# Patient Record
Sex: Female | Born: 1990 | Race: Black or African American | Hispanic: No | Marital: Married | Smoking: Former smoker
Health system: Southern US, Community
[De-identification: ages and names within clinical notes are randomized; demographics above are authoritative.]

## PROBLEM LIST (undated history)

## (undated) ENCOUNTER — Inpatient Hospital Stay (HOSPITAL_COMMUNITY): Payer: Self-pay

## (undated) DIAGNOSIS — F32A Depression, unspecified: Secondary | ICD-10-CM

## (undated) DIAGNOSIS — K219 Gastro-esophageal reflux disease without esophagitis: Secondary | ICD-10-CM

## (undated) DIAGNOSIS — F411 Generalized anxiety disorder: Secondary | ICD-10-CM

## (undated) DIAGNOSIS — D649 Anemia, unspecified: Secondary | ICD-10-CM

## (undated) DIAGNOSIS — Z8619 Personal history of other infectious and parasitic diseases: Secondary | ICD-10-CM

## (undated) DIAGNOSIS — F329 Major depressive disorder, single episode, unspecified: Secondary | ICD-10-CM

## (undated) HISTORY — DX: Anemia, unspecified: D64.9

## (undated) HISTORY — DX: Personal history of other infectious and parasitic diseases: Z86.19

## (undated) HISTORY — PX: DILATION AND CURETTAGE OF UTERUS: SHX78

---

## 2010-07-14 ENCOUNTER — Ambulatory Visit: Payer: Self-pay | Admitting: Obstetrics and Gynecology

## 2010-07-14 ENCOUNTER — Inpatient Hospital Stay (HOSPITAL_COMMUNITY): Admission: AD | Admit: 2010-07-14 | Discharge: 2010-07-14 | Payer: Self-pay | Admitting: Anesthesiology

## 2010-08-05 ENCOUNTER — Emergency Department (HOSPITAL_COMMUNITY): Admission: EM | Admit: 2010-08-05 | Discharge: 2010-08-05 | Payer: Self-pay | Admitting: Emergency Medicine

## 2011-03-04 LAB — URINALYSIS, ROUTINE W REFLEX MICROSCOPIC
Glucose, UA: NEGATIVE mg/dL
Hgb urine dipstick: NEGATIVE
Ketones, ur: NEGATIVE mg/dL
Protein, ur: NEGATIVE mg/dL
Urobilinogen, UA: 0.2 mg/dL (ref 0.0–1.0)

## 2011-05-25 ENCOUNTER — Other Ambulatory Visit (HOSPITAL_COMMUNITY): Payer: Self-pay | Admitting: Obstetrics and Gynecology

## 2011-05-25 DIAGNOSIS — O3680X0 Pregnancy with inconclusive fetal viability, not applicable or unspecified: Secondary | ICD-10-CM

## 2011-05-26 ENCOUNTER — Ambulatory Visit (HOSPITAL_COMMUNITY): Payer: BC Managed Care – PPO

## 2011-05-26 ENCOUNTER — Other Ambulatory Visit (HOSPITAL_COMMUNITY): Payer: Self-pay

## 2011-05-30 ENCOUNTER — Other Ambulatory Visit (HOSPITAL_COMMUNITY): Payer: Self-pay

## 2011-05-30 ENCOUNTER — Ambulatory Visit (HOSPITAL_COMMUNITY)
Admission: RE | Admit: 2011-05-30 | Discharge: 2011-05-30 | Disposition: A | Discharge status: Home / Self Care | Payer: BC Managed Care – PPO | Source: Ambulatory Visit | Attending: Obstetrics and Gynecology | Admitting: Obstetrics and Gynecology

## 2011-05-30 DIAGNOSIS — Z3689 Encounter for other specified antenatal screening: Secondary | ICD-10-CM | POA: Insufficient documentation

## 2011-05-30 DIAGNOSIS — O3680X0 Pregnancy with inconclusive fetal viability, not applicable or unspecified: Secondary | ICD-10-CM

## 2011-06-03 ENCOUNTER — Inpatient Hospital Stay (HOSPITAL_COMMUNITY)
Admission: AD | Admit: 2011-06-03 | Discharge: 2011-06-03 | Disposition: A | Discharge status: Home / Self Care | Payer: BC Managed Care – PPO | Source: Ambulatory Visit | Attending: Obstetrics and Gynecology | Admitting: Obstetrics and Gynecology

## 2011-06-03 DIAGNOSIS — N760 Acute vaginitis: Secondary | ICD-10-CM | POA: Insufficient documentation

## 2011-06-03 DIAGNOSIS — A499 Bacterial infection, unspecified: Secondary | ICD-10-CM | POA: Insufficient documentation

## 2011-06-03 DIAGNOSIS — B9689 Other specified bacterial agents as the cause of diseases classified elsewhere: Secondary | ICD-10-CM | POA: Insufficient documentation

## 2011-06-03 DIAGNOSIS — B3731 Acute candidiasis of vulva and vagina: Secondary | ICD-10-CM | POA: Insufficient documentation

## 2011-06-03 DIAGNOSIS — O2390 Unspecified genitourinary tract infection in pregnancy, unspecified trimester: Secondary | ICD-10-CM

## 2011-06-03 DIAGNOSIS — B373 Candidiasis of vulva and vagina: Secondary | ICD-10-CM | POA: Insufficient documentation

## 2011-06-03 DIAGNOSIS — N949 Unspecified condition associated with female genital organs and menstrual cycle: Secondary | ICD-10-CM | POA: Insufficient documentation

## 2011-06-03 LAB — WET PREP, GENITAL: WBC, Wet Prep HPF POC: NONE SEEN

## 2011-06-06 LAB — GC/CHLAMYDIA PROBE AMP, GENITAL: Chlamydia, DNA Probe: POSITIVE — AB

## 2011-06-19 LAB — ABO/RH: RH Type: POSITIVE

## 2011-06-19 LAB — RUBELLA ANTIBODY, IGM: Rubella: NON-IMMUNE/NOT IMMUNE

## 2011-06-19 LAB — HEPATITIS B SURFACE ANTIGEN: Hepatitis B Surface Ag: NEGATIVE

## 2011-06-19 LAB — RPR: RPR: NONREACTIVE

## 2011-07-07 ENCOUNTER — Encounter (HOSPITAL_COMMUNITY): Payer: Self-pay | Admitting: *Deleted

## 2011-07-07 ENCOUNTER — Inpatient Hospital Stay (HOSPITAL_COMMUNITY)
Admission: AD | Admit: 2011-07-07 | Discharge: 2011-07-08 | Disposition: A | Discharge status: Home / Self Care | Payer: BC Managed Care – PPO | Source: Ambulatory Visit | Attending: Obstetrics and Gynecology | Admitting: Obstetrics and Gynecology

## 2011-07-07 DIAGNOSIS — W108XXA Fall (on) (from) other stairs and steps, initial encounter: Secondary | ICD-10-CM | POA: Insufficient documentation

## 2011-07-07 DIAGNOSIS — W19XXXA Unspecified fall, initial encounter: Secondary | ICD-10-CM

## 2011-07-07 DIAGNOSIS — O99891 Other specified diseases and conditions complicating pregnancy: Secondary | ICD-10-CM | POA: Insufficient documentation

## 2011-07-07 DIAGNOSIS — M549 Dorsalgia, unspecified: Secondary | ICD-10-CM | POA: Insufficient documentation

## 2011-07-07 DIAGNOSIS — R109 Unspecified abdominal pain: Secondary | ICD-10-CM | POA: Insufficient documentation

## 2011-07-07 DIAGNOSIS — Z331 Pregnant state, incidental: Secondary | ICD-10-CM

## 2011-07-07 NOTE — Progress Notes (Signed)
Pt states, " At about 9 pm I fell down 4 wooden step onto my back and the right side of my abd.. Since I fell i've had pain in my low back and the right side of my abdomen."

## 2011-07-07 NOTE — ED Provider Notes (Signed)
History     Chief Complaint  Patient presents with  . Fall  . Back Pain   HPI 14w 6d gestation.  G1 P0  Tonight slipped down steps at home and hit on right lower back and right side and abdomen at waist level.    OB History    Grav Para Term Preterm Abortions TAB SAB Ect Mult Living   1         0      No past medical history on file.  No past surgical history on file.  No family history on file.  History  Substance Use Topics  . Smoking status: Former Smoker -- 0.5 packs/day for 2 years    Types: Cigarettes    Quit date: 04/25/2011  . Smokeless tobacco: Not on file  . Alcohol Use: No    Allergies: No Known Allergies  Prescriptions prior to admission  Medication Sig Dispense Refill  . azithromycin (ZITHROMAX) 500 MG tablet Take 500 mg by mouth daily.        . metroNIDAZOLE (FLAGYL) 500 MG tablet Take 500 mg by mouth 2 (two) times daily. Take for 7 days course       . prenatal vitamin w/FE, FA (PRENATAL 1 + 1) 27-1 MG TABS Take 1 tablet by mouth daily.        . Psyllium (METAMUCIL PO) Take 1 each by mouth daily as needed. For constipation         Review of Systems  Genitourinary:       No vaginal bleeding. No leaking of fluid.  Abdominal pain where she hit at waist level.  No uterine cramping.   Physical Exam   Blood pressure 112/79, pulse 90, temperature 98.6 F (37 C), temperature source Oral, resp. rate 18, height 5' 4.5" (1.638 m), weight 152 lb 4 oz (69.06 kg).  Physical Exam  Nursing note and vitals reviewed. Constitutional: She is oriented to person, place, and time. She appears well-developed and well-nourished.  HENT:  Head: Normocephalic.  Eyes: EOM are normal.  Neck: Neck supple.  GI: Soft.       Fundus below umbilicus.  Pain above umbilicus on right side of abdomen extending to client's right side and right lower back.  No bruising or discoloration or breaks in skin noted.   FHT 158 with doppler.  Musculoskeletal: Normal range of motion.    Neurological: She is alert and oriented to person, place, and time.  Skin: Skin is warm and dry.       Some areas of hyperpigmentation and rough skin noted consistent with atopic dermatitis  Psychiatric: She has a normal mood and affect.    MAU Course  Procedures  MDM Consult with Dr. Ellyn Hack re: plan of care.  Assessment and Plan  Abdominal pain and back pain following fall  Plan: Take Tylenol 325 mg 2 tablets by mouth every 4 hours if needed for pain. Expect to have some pain. Return with worsening lower abdominal pain or vaginal bleeding. Call your doctor if you have questions or concerns.  BURLESON,TERRI 07/07/2011, 11:56 PM

## 2011-11-15 ENCOUNTER — Inpatient Hospital Stay (HOSPITAL_COMMUNITY)
Admission: AD | Admit: 2011-11-15 | Discharge: 2011-11-15 | Disposition: A | Discharge status: Home / Self Care | Payer: BC Managed Care – PPO | Source: Ambulatory Visit | Attending: Obstetrics and Gynecology | Admitting: Obstetrics and Gynecology

## 2011-11-15 ENCOUNTER — Other Ambulatory Visit: Payer: Self-pay | Admitting: Obstetrics and Gynecology

## 2011-11-15 DIAGNOSIS — A64 Unspecified sexually transmitted disease: Secondary | ICD-10-CM | POA: Insufficient documentation

## 2011-11-15 DIAGNOSIS — O98319 Other infections with a predominantly sexual mode of transmission complicating pregnancy, unspecified trimester: Secondary | ICD-10-CM | POA: Insufficient documentation

## 2011-11-15 MED ORDER — CEFTRIAXONE SODIUM 250 MG IJ SOLR
250.0000 mg | Freq: Once | INTRAMUSCULAR | Status: AC
  Administered 2011-11-15: 250 mg via INTRAMUSCULAR
  Filled 2011-11-15: qty 250

## 2011-11-15 MED ORDER — CEFTRIAXONE SODIUM 250 MG IJ SOLR: 250.0000 mg | Freq: Once | INTRAMUSCULAR | Status: DC

## 2011-11-22 ENCOUNTER — Encounter (HOSPITAL_COMMUNITY): Payer: Self-pay | Admitting: Advanced Practice Midwife

## 2011-11-22 ENCOUNTER — Inpatient Hospital Stay (HOSPITAL_COMMUNITY)
Admission: AD | Admit: 2011-11-22 | Discharge: 2011-11-22 | Disposition: A | Discharge status: Home / Self Care | Payer: BC Managed Care – PPO | Source: Ambulatory Visit | Attending: Obstetrics and Gynecology | Admitting: Obstetrics and Gynecology

## 2011-11-22 DIAGNOSIS — O479 False labor, unspecified: Secondary | ICD-10-CM

## 2011-11-22 DIAGNOSIS — O4700 False labor before 37 completed weeks of gestation, unspecified trimester: Secondary | ICD-10-CM | POA: Insufficient documentation

## 2011-11-22 LAB — URINALYSIS, ROUTINE W REFLEX MICROSCOPIC
Ketones, ur: NEGATIVE mg/dL
Nitrite: NEGATIVE
Protein, ur: NEGATIVE mg/dL
Urobilinogen, UA: 0.2 mg/dL (ref 0.0–1.0)

## 2011-11-22 LAB — URINE MICROSCOPIC-ADD ON

## 2011-11-22 MED ORDER — HYDROXYZINE HCL 25 MG PO TABS
25.0000 mg | ORAL_TABLET | Freq: Once | ORAL | Status: AC
  Administered 2011-11-22: 25 mg via ORAL
  Filled 2011-11-22: qty 1

## 2011-11-22 MED ORDER — HYDROXYZINE HCL 10 MG PO TABS
10.0000 mg | ORAL_TABLET | Freq: Three times a day (TID) | ORAL | Status: AC | PRN
Start: 2011-11-22 — End: 2011-12-02

## 2011-11-22 NOTE — Progress Notes (Signed)
N. Frazier, CNM at bedside.  Assessment done and poc discussed with pt.  

## 2011-11-22 NOTE — ED Provider Notes (Signed)
History     Chief Complaint  Patient presents with  . Abdominal Pain    contractions   HPI 20 y.o. G1P0 at [redacted]w[redacted]d c/o contractions x about 1.5, coming "every few minutes", no bleeding or leaking fluid. + fetal movement. Uncomplicated prenatal course.   No past medical history on file.  No past surgical history on file.  No family history on file.  History  Substance Use Topics  . Smoking status: Former Smoker -- 0.5 packs/day for 2 years    Types: Cigarettes    Quit date: 04/25/2011  . Smokeless tobacco: Not on file  . Alcohol Use: No    Allergies: No Known Allergies  Prescriptions prior to admission  Medication Sig Dispense Refill  . cefTRIAXone (ROCEPHIN) 250 MG injection Inject 250 mg into the muscle once.  FOR IM use in LARGE MUSCLE MASS  1 each  0  . DiphenhydrAMINE HCl (BENADRYL PO) Take 1 capsule by mouth daily as needed. Patient took this medication for allergies.      . metroNIDAZOLE (FLAGYL) 500 MG tablet Take 500 mg by mouth 2 (two) times daily. Take for 7 days course       . prenatal vitamin w/FE, FA (PRENATAL 1 + 1) 27-1 MG TABS Take 1 tablet by mouth daily.          Review of Systems  Constitutional: Negative.   Respiratory: Negative.   Cardiovascular: Negative.   Gastrointestinal: Negative for nausea, vomiting, abdominal pain, diarrhea and constipation.  Genitourinary: Negative for dysuria, urgency, frequency, hematuria and flank pain.       Negative for vaginal bleeding, Positive contractions  Musculoskeletal: Negative.   Neurological: Negative.   Psychiatric/Behavioral: Negative.    Physical Exam   Blood pressure 135/76, pulse 117, temperature 97.3 F (36.3 C), temperature source Oral, resp. rate 20.  Physical Exam  Nursing note and vitals reviewed. Constitutional: She is oriented to person, place, and time. She appears well-developed and well-nourished. No distress.  Cardiovascular: Normal rate.   Respiratory: Effort normal.  GI: Soft. She  exhibits no distension.  Genitourinary: No bleeding around the vagina.       SVE: Closed/Thick/High  Musculoskeletal: Normal range of motion.  Neurological: She is alert and oriented to person, place, and time.  Skin: Skin is warm and dry.  Psychiatric: She has a normal mood and affect.   EFM: 140s, moderate variability, no accels, TOCO: irregular MAU Course  Procedures    Assessment and Plan  20 y.o. G1P0 at [redacted]w[redacted]d, threatned preterm labor,no signs of active labor Vistaril ordered for anxiety UA pending D. [name], CNM to assume care  [facility] 11/22/2011, 8:33 PM   Georges Mouse consulted Dr. Senaida Ores. Rx Vistaril FHR 140 reactive with accels 155, blunted variability after Vistaril Occ 30 sec low amplitude UC Will discharge with PTL precautions and kick counts [name] 11/22/2011 9:30 PM  .

## 2011-11-22 NOTE — Progress Notes (Signed)
Pt states having contraction pains since 7pm tonight ongoing.

## 2011-12-09 ENCOUNTER — Inpatient Hospital Stay (HOSPITAL_COMMUNITY)
Admission: AD | Admit: 2011-12-09 | Discharge: 2011-12-10 | Disposition: A | Discharge status: Home / Self Care | Payer: BC Managed Care – PPO | Source: Ambulatory Visit | Attending: Obstetrics and Gynecology | Admitting: Obstetrics and Gynecology

## 2011-12-09 ENCOUNTER — Encounter (HOSPITAL_COMMUNITY): Payer: Self-pay | Admitting: *Deleted

## 2011-12-09 DIAGNOSIS — O99891 Other specified diseases and conditions complicating pregnancy: Secondary | ICD-10-CM | POA: Insufficient documentation

## 2011-12-09 NOTE — Progress Notes (Signed)
Pt states, " I started having contractions at 7:53 pm and then at 9 pm I felt the need to go to the bathroom, and I couldn't make it and water ran down my legs. It was clear and didn't smell like urine. It hasn't happened anymore."

## 2011-12-10 LAB — POCT FERN TEST: Fern Test: NEGATIVE

## 2011-12-10 NOTE — Progress Notes (Signed)
Written and verbal d/c instructions given and understanding voiced. 

## 2011-12-10 NOTE — ED Notes (Signed)
Sprite to pt  °

## 2011-12-10 NOTE — Progress Notes (Signed)
Dr Ambrose Mantle notified of pt's admission and status. Aware of ctx pattern, sve, neg fern, reactive fhr. Pt stable for d/c home.

## 2011-12-10 NOTE — ED Notes (Signed)
Pt's mother states all family members having trouble waking up from anesthesia.

## 2011-12-10 NOTE — Progress Notes (Signed)
Wynelle Bourgeois CNM in to do spec exam to r/o srom. Pt tol spec exam well

## 2011-12-10 NOTE — ED Provider Notes (Signed)
History     Chief Complaint  Patient presents with  . Rupture of Membranes   HPI This is a 20 y.o. at [redacted]w[redacted]d who presents with report of leaking fluid, soaking a pad. ALso reports contractions. Denies bleeding   OB History    Grav Para Term Preterm Abortions TAB SAB Ect Mult Living   1         0      Past Medical History  Diagnosis Date  . No pertinent past medical history     Past Surgical History  Procedure Date  . No past surgeries     Family History  Problem Relation Age of Onset  . Anesthesia problems Mother   . Anesthesia problems Father   . Anesthesia problems Sister   . Anesthesia problems Brother     History  Substance Use Topics  . Smoking status: Former Smoker -- 0.5 packs/day for 2 years    Types: Cigarettes    Quit date: 04/25/2011  . Smokeless tobacco: Not on file  . Alcohol Use: No    Allergies: No Known Allergies  No prescriptions prior to admission    ROS As above  Physical Exam   Blood pressure 134/66, pulse 90, temperature 97.9 F (36.6 C), temperature source Oral, resp. rate 20, height 5\' 5"  (1.651 m), weight 184 lb 6 oz (83.632 kg).  Physical Exam  Constitutional: She appears well-developed and well-nourished. No distress.  Respiratory: Effort normal.  GI: Soft. There is no tenderness.  Genitourinary: Vagina normal and uterus normal. No vaginal discharge (No pooling, no ferning) found.  Musculoskeletal: Normal range of motion.  Neurological: She is alert.  Skin: Skin is warm and dry.  Psychiatric: She has a normal mood and affect.    MAU Course  Procedures  Assessment and Plan  A:  IUP at [redacted]w[redacted]d      Reported leaking of fluid, no evidence of SROM P:  D/C home per Dr Ace Gins 12/10/2011, 1:09 AM

## 2011-12-22 ENCOUNTER — Telehealth (HOSPITAL_COMMUNITY): Payer: Self-pay | Admitting: *Deleted

## 2011-12-22 ENCOUNTER — Encounter (HOSPITAL_COMMUNITY): Payer: Self-pay | Admitting: *Deleted

## 2011-12-22 NOTE — Telephone Encounter (Signed)
Preadmission screen  

## 2011-12-25 ENCOUNTER — Telehealth (HOSPITAL_COMMUNITY): Payer: Self-pay | Admitting: *Deleted

## 2011-12-25 ENCOUNTER — Encounter (HOSPITAL_COMMUNITY): Payer: Self-pay | Admitting: *Deleted

## 2011-12-25 NOTE — Telephone Encounter (Signed)
Preadmission screen  

## 2011-12-26 ENCOUNTER — Inpatient Hospital Stay (HOSPITAL_COMMUNITY): Payer: BC Managed Care – PPO | Admitting: Anesthesiology

## 2011-12-26 ENCOUNTER — Inpatient Hospital Stay (HOSPITAL_COMMUNITY)
Admission: RE | Admit: 2011-12-26 | Discharge: 2011-12-28 | DRG: 373 | Disposition: A | Discharge status: Home / Self Care | Payer: BC Managed Care – PPO | Source: Ambulatory Visit | Attending: Obstetrics and Gynecology | Admitting: Obstetrics and Gynecology

## 2011-12-26 ENCOUNTER — Other Ambulatory Visit: Payer: Self-pay | Admitting: Obstetrics and Gynecology

## 2011-12-26 ENCOUNTER — Encounter (HOSPITAL_COMMUNITY): Payer: Self-pay | Admitting: Anesthesiology

## 2011-12-26 ENCOUNTER — Encounter (HOSPITAL_COMMUNITY): Payer: Self-pay

## 2011-12-26 DIAGNOSIS — O99892 Other specified diseases and conditions complicating childbirth: Principal | ICD-10-CM | POA: Diagnosis present

## 2011-12-26 DIAGNOSIS — Z22330 Carrier of Group B streptococcus: Secondary | ICD-10-CM

## 2011-12-26 LAB — CBC
Hemoglobin: 11.9 g/dL — ABNORMAL LOW (ref 12.0–15.0)
MCH: 33 pg (ref 26.0–34.0)
MCHC: 34.3 g/dL (ref 30.0–36.0)

## 2011-12-26 MED ORDER — PHENYLEPHRINE 40 MCG/ML (10ML) SYRINGE FOR IV PUSH (FOR BLOOD PRESSURE SUPPORT): 80.0000 ug | PREFILLED_SYRINGE | INTRAVENOUS | Status: DC | PRN

## 2011-12-26 MED ORDER — OXYTOCIN 20 UNITS IN LACTATED RINGERS INFUSION - SIMPLE: 125.0000 mL/h | Freq: Once | INTRAVENOUS | Status: DC | PRN

## 2011-12-26 MED ORDER — OXYTOCIN 20 UNITS IN LACTATED RINGERS INFUSION - SIMPLE: 125.0000 mL/h | INTRAVENOUS | Status: AC

## 2011-12-26 MED ORDER — LIDOCAINE HCL (PF) 1 % IJ SOLN
30.0000 mL | INTRAMUSCULAR | Status: DC | PRN
  Filled 2011-12-26: qty 30

## 2011-12-26 MED ORDER — OXYCODONE-ACETAMINOPHEN 5-325 MG PO TABS
1.0000 | ORAL_TABLET | ORAL | Status: DC | PRN
  Administered 2011-12-27 (×3): 1 via ORAL
  Filled 2011-12-26 (×3): qty 1

## 2011-12-26 MED ORDER — LANOLIN HYDROUS EX OINT: TOPICAL_OINTMENT | CUTANEOUS | Status: DC | PRN

## 2011-12-26 MED ORDER — DIPHENHYDRAMINE HCL 25 MG PO CAPS: 25.0000 mg | ORAL_CAPSULE | Freq: Four times a day (QID) | ORAL | Status: DC | PRN

## 2011-12-26 MED ORDER — BUTORPHANOL TARTRATE 2 MG/ML IJ SOLN
1.0000 mg | Freq: Once | INTRAMUSCULAR | Status: AC
  Administered 2011-12-26: 1 mg via INTRAVENOUS

## 2011-12-26 MED ORDER — FENTANYL 2.5 MCG/ML BUPIVACAINE 1/10 % EPIDURAL INFUSION (WH - ANES)
INTRAMUSCULAR | Status: DC | PRN
  Administered 2011-12-26: 14 mL/h via EPIDURAL

## 2011-12-26 MED ORDER — BUTORPHANOL TARTRATE 2 MG/ML IJ SOLN
INTRAMUSCULAR | Status: AC
  Administered 2011-12-26: 1 mg via INTRAVENOUS
  Filled 2011-12-26: qty 1

## 2011-12-26 MED ORDER — LIDOCAINE HCL 1.5 % IJ SOLN
INTRAMUSCULAR | Status: DC | PRN
  Administered 2011-12-26 (×2): 4 mL via EPIDURAL

## 2011-12-26 MED ORDER — ONDANSETRON HCL 4 MG PO TABS
4.0000 mg | ORAL_TABLET | ORAL | Status: DC | PRN
  Administered 2011-12-27: 4 mg via ORAL
  Filled 2011-12-26: qty 1

## 2011-12-26 MED ORDER — IBUPROFEN 600 MG PO TABS
600.0000 mg | ORAL_TABLET | Freq: Four times a day (QID) | ORAL | Status: DC
  Administered 2011-12-27 – 2011-12-28 (×6): 600 mg via ORAL
  Filled 2011-12-26 (×6): qty 1

## 2011-12-26 MED ORDER — BENZOCAINE-MENTHOL 20-0.5 % EX AERO
1.0000 "application " | INHALATION_SPRAY | CUTANEOUS | Status: DC | PRN
  Administered 2011-12-27: 1 via TOPICAL

## 2011-12-26 MED ORDER — ZOLPIDEM TARTRATE 5 MG PO TABS: 5.0000 mg | ORAL_TABLET | Freq: Every evening | ORAL | Status: DC | PRN

## 2011-12-26 MED ORDER — FLEET ENEMA 7-19 GM/118ML RE ENEM: 1.0000 | ENEMA | RECTAL | Status: DC | PRN

## 2011-12-26 MED ORDER — WITCH HAZEL-GLYCERIN EX PADS: 1.0000 "application " | MEDICATED_PAD | CUTANEOUS | Status: DC | PRN

## 2011-12-26 MED ORDER — SIMETHICONE 80 MG PO CHEW: 80.0000 mg | CHEWABLE_TABLET | ORAL | Status: DC | PRN

## 2011-12-26 MED ORDER — EPHEDRINE 5 MG/ML INJ: 10.0000 mg | INTRAVENOUS | Status: DC | PRN

## 2011-12-26 MED ORDER — PRENATAL MULTIVITAMIN CH
1.0000 | ORAL_TABLET | Freq: Every day | ORAL | Status: DC
  Administered 2011-12-27: 1 via ORAL
  Filled 2011-12-26: qty 1

## 2011-12-26 MED ORDER — LACTATED RINGERS IV SOLN
500.0000 mL | Freq: Once | INTRAVENOUS | Status: AC
  Administered 2011-12-26: 1000 mL via INTRAVENOUS

## 2011-12-26 MED ORDER — DIPHENHYDRAMINE HCL 50 MG/ML IJ SOLN: 12.5000 mg | INTRAMUSCULAR | Status: DC | PRN

## 2011-12-26 MED ORDER — ONDANSETRON HCL 4 MG/2ML IJ SOLN: 4.0000 mg | INTRAMUSCULAR | Status: DC | PRN

## 2011-12-26 MED ORDER — FENTANYL 2.5 MCG/ML BUPIVACAINE 1/10 % EPIDURAL INFUSION (WH - ANES)
14.0000 mL/h | INTRAMUSCULAR | Status: DC
  Administered 2011-12-26: 14 mL/h via EPIDURAL
  Filled 2011-12-26: qty 60

## 2011-12-26 MED ORDER — OXYCODONE-ACETAMINOPHEN 5-325 MG PO TABS: 2.0000 | ORAL_TABLET | ORAL | Status: DC | PRN

## 2011-12-26 MED ORDER — EPHEDRINE 5 MG/ML INJ
INTRAVENOUS | Status: AC
  Filled 2011-12-26: qty 4

## 2011-12-26 MED ORDER — OXYTOCIN 20 UNITS IN LACTATED RINGERS INFUSION - SIMPLE
1.0000 m[IU]/min | INTRAVENOUS | Status: DC
  Administered 2011-12-26: 14 m[IU]/min via INTRAVENOUS
  Administered 2011-12-26: 12 m[IU]/min via INTRAVENOUS
  Administered 2011-12-26: 333 m[IU]/min via INTRAVENOUS
  Administered 2011-12-26: 4 m[IU]/min via INTRAVENOUS
  Administered 2011-12-26: 2 m[IU]/min via INTRAVENOUS
  Filled 2011-12-26: qty 1000

## 2011-12-26 MED ORDER — MEASLES, MUMPS & RUBELLA VAC ~~LOC~~ INJ
0.5000 mL | INJECTION | Freq: Once | SUBCUTANEOUS | Status: DC
  Filled 2011-12-26: qty 0.5

## 2011-12-26 MED ORDER — PHENYLEPHRINE 40 MCG/ML (10ML) SYRINGE FOR IV PUSH (FOR BLOOD PRESSURE SUPPORT)
PREFILLED_SYRINGE | INTRAVENOUS | Status: AC
  Filled 2011-12-26: qty 5

## 2011-12-26 MED ORDER — OXYTOCIN BOLUS FROM INFUSION
500.0000 mL | Freq: Once | INTRAVENOUS | Status: AC | PRN
  Filled 2011-12-26: qty 500

## 2011-12-26 MED ORDER — IBUPROFEN 600 MG PO TABS
600.0000 mg | ORAL_TABLET | Freq: Four times a day (QID) | ORAL | Status: DC | PRN
  Administered 2011-12-26: 600 mg via ORAL
  Filled 2011-12-26: qty 1

## 2011-12-26 MED ORDER — CITRIC ACID-SODIUM CITRATE 334-500 MG/5ML PO SOLN: 30.0000 mL | ORAL | Status: DC | PRN

## 2011-12-26 MED ORDER — DIBUCAINE 1 % RE OINT: 1.0000 "application " | TOPICAL_OINTMENT | RECTAL | Status: DC | PRN

## 2011-12-26 MED ORDER — TERBUTALINE SULFATE 1 MG/ML IJ SOLN: 0.2500 mg | Freq: Once | INTRAMUSCULAR | Status: DC | PRN

## 2011-12-26 MED ORDER — LACTATED RINGERS IV SOLN
INTRAVENOUS | Status: DC
  Administered 2011-12-26: 08:00:00 via INTRAVENOUS

## 2011-12-26 MED ORDER — TETANUS-DIPHTH-ACELL PERTUSSIS 5-2.5-18.5 LF-MCG/0.5 IM SUSP
0.5000 mL | Freq: Once | INTRAMUSCULAR | Status: AC
  Administered 2011-12-27: 0.5 mL via INTRAMUSCULAR
  Filled 2011-12-26: qty 0.5

## 2011-12-26 MED ORDER — FENTANYL 2.5 MCG/ML BUPIVACAINE 1/10 % EPIDURAL INFUSION (WH - ANES)
INTRAMUSCULAR | Status: AC
  Administered 2011-12-26: 14 mL/h via EPIDURAL
  Filled 2011-12-26: qty 60

## 2011-12-26 MED ORDER — ONDANSETRON HCL 4 MG/2ML IJ SOLN
4.0000 mg | Freq: Four times a day (QID) | INTRAMUSCULAR | Status: DC | PRN
  Administered 2011-12-26: 4 mg via INTRAVENOUS
  Filled 2011-12-26: qty 2

## 2011-12-26 MED ORDER — PENICILLIN G POTASSIUM 5000000 UNITS IJ SOLR
5.0000 10*6.[IU] | Freq: Once | INTRAVENOUS | Status: AC
  Administered 2011-12-26: 5 10*6.[IU] via INTRAVENOUS
  Filled 2011-12-26: qty 5

## 2011-12-26 MED ORDER — SENNOSIDES-DOCUSATE SODIUM 8.6-50 MG PO TABS
2.0000 | ORAL_TABLET | Freq: Every day | ORAL | Status: DC
  Administered 2011-12-27: 2 via ORAL

## 2011-12-26 MED ORDER — LACTATED RINGERS IV SOLN: 500.0000 mL | INTRAVENOUS | Status: DC | PRN

## 2011-12-26 MED ORDER — BUTORPHANOL TARTRATE 2 MG/ML IJ SOLN
INTRAMUSCULAR | Status: AC
  Filled 2011-12-26: qty 1

## 2011-12-26 MED ORDER — ACETAMINOPHEN 325 MG PO TABS: 650.0000 mg | ORAL_TABLET | ORAL | Status: DC | PRN

## 2011-12-26 MED ORDER — PENICILLIN G POTASSIUM 5000000 UNITS IJ SOLR
2.5000 10*6.[IU] | INTRAVENOUS | Status: DC
  Administered 2011-12-26 (×3): 2.5 10*6.[IU] via INTRAVENOUS
  Filled 2011-12-26 (×5): qty 2.5

## 2011-12-26 NOTE — Progress Notes (Signed)
Patient ID: [name], female   DOB: [birth year], 21 y.o.   MRN: [id number] Pitocin at 82mu/ minute and contractions q 2-41/2 minutes apart. Cervix 2 cm 50 % effaced vertex at -2 station AROM produced clear fluid.

## 2011-12-26 NOTE — Anesthesia Procedure Notes (Signed)
Epidural Patient location during procedure: OB Start time: 12/26/2011 6:56 PM  Staffing Anesthesiologist: Jensyn Shave A. Performed by: anesthesiologist   Preanesthetic Checklist Completed: patient identified, site marked, surgical consent, pre-op evaluation, timeout performed, IV checked, risks and benefits discussed and monitors and equipment checked  Epidural Patient position: sitting Prep: site prepped and draped and DuraPrep Patient monitoring: continuous pulse ox and blood pressure Approach: midline Injection technique: LOR air  Needle:  Needle type: Tuohy  Needle gauge: 17 G Needle length: 9 cm Needle insertion depth: 5 cm cm Catheter type: closed end flexible Catheter size: 19 Gauge Catheter at skin depth: 10 cm Test dose: negative and 1.5% lidocaine  Assessment Events: blood not aspirated, injection not painful, no injection resistance, negative IV test and no paresthesia  Additional Notes Patient is more comfortable after epidural dosed. Please see RN's note for documentation of vital signs and FHR which are stable.

## 2011-12-26 NOTE — Anesthesia Preprocedure Evaluation (Signed)
Anesthesia Evaluation  Patient identified by MRN, date of birth, ID band Patient awake    Reviewed: Allergy & Precautions, H&P , Patient's Chart, lab work & pertinent test results  Airway Mallampati: III TM Distance: >3 FB Neck ROM: full    Dental No notable dental hx. (+) Teeth Intact   Pulmonary neg pulmonary ROS,  clear to auscultation  Pulmonary exam normal       Cardiovascular neg cardio ROS regular Normal    Neuro/Psych Negative Neurological ROS  Negative Psych ROS   GI/Hepatic negative GI ROS, Neg liver ROS,   Endo/Other  Negative Endocrine ROS  Renal/GU negative Renal ROS  Genitourinary negative   Musculoskeletal   Abdominal Normal abdominal exam  (+)   Peds  Hematology negative hematology ROS (+)   Anesthesia Other Findings   Reproductive/Obstetrics (+) Pregnancy                           Anesthesia Physical Anesthesia Plan  ASA: II  Anesthesia Plan: Epidural   Post-op Pain Management:    Induction:   Airway Management Planned:   Additional Equipment:   Intra-op Plan:   Post-operative Plan:   Informed Consent: I have reviewed the patients History and Physical, chart, labs and discussed the procedure including the risks, benefits and alternatives for the proposed anesthesia with the patient or authorized representative who has indicated his/her understanding and acceptance.     Plan Discussed with: Anesthesiologist and Surgeon  Anesthesia Plan Comments:         Anesthesia Quick Evaluation  

## 2011-12-26 NOTE — Progress Notes (Signed)
Patient ID: [name], female   DOB: November 17, 1991, 21 y.o.   MRN: [id number] Delivery note:    The pt became fully dilated at 9:48 PM and pushed well. She brought the vertex down LOA and delivered a living female infant spontaneously LOA over an intact perineum. The infant was 7 pounds 5 ounces and had Apgars of 9 and 9 at 1 and 5 minutes. The placenta delivered intact and the uterus was normal. There were no lacerations. EBL 500 cc's.

## 2011-12-26 NOTE — Progress Notes (Signed)
Patient ID: [name], female   DOB: Apr 25, 1991, 21 y.o.   MRN: [id number] Cervix FT dilated 30% effaced and the vertex is at -3 station.Will begin pitocin

## 2011-12-26 NOTE — Progress Notes (Signed)
Patient ID: [name], female   DOB: July 07, 1991, 21 y.o.   MRN: [id number] Pt was examined by the nurse at 5PM and was 4 cm 90% effaced She is very uncomfortable and does not want an exam until after her epidural.

## 2011-12-26 NOTE — H&P (Signed)
[name], [name]               ACCOUNT NO.:  [id number]  MEDICAL RECORD NO.:  [id number]  LOCATION:  9173                          FACILITY:  [facility]  PHYSICIAN:  Malachi Pro. Ambrose Mantle, M.D. DATE OF BIRTH:  01/22/1991  DATE OF ADMISSION:  12/26/2011 DATE OF DISCHARGE:                             HISTORY & PHYSICAL   PRESENT ILLNESS:  This is a 21 year old black female, para 0, gravida 1, EDC December 30, 2011 by an ultrasound done at 64 and 3/7th weeks on May 30, 2011.  The patient had a prenatal visit and lab work done.  Blood group and type A positive, negative antibody, rubella immune, RPR nonreactive, urine culture negative, hepatitis B surface antigen negative, HIV negative, GC and Chlamydia negative, hemoglobin electrophoresis AA.  Cystic fibrosis carrier screen declined, 1st trimester screen negative, AFP negative, 1-hour Glucola 92, group B strep positive.  Repeat HIV and RPR nonreactive, and GC and Chlamydia negative.  The patient was actually treated for Chlamydia prior to her 1st prenatal visit on June 06, 2011.  The patient's prenatal course was relatively uncomplicated.  The last 3 prenatal visits, her blood pressure have been 140/80, 132/84, and 140/92.  She was scheduled for admission and induction of labor because of cervix had become a fingertip dilated.  PAST MEDICAL HISTORY:  She has had anemia.  She had asthma as a child. She did have Chlamydia in June.  ALLERGIES:  No known drug allergies.  PAST SURGICAL HISTORY:  She has not had any surgical history.  SOCIAL HISTORY:  She denies alcohol, tobacco use.  She is not known to use drugs.  FAMILY HISTORY:  Her mother has arthritis.  Brother has asthma. Maternal grandmother with cancer of the cervix.  Two cousins with cerebral palsy.  Paternal grandfather with diabetes.  Aunt, maternal and paternal grandfathers and uncle have high blood pressure and has lung cancer.  Maternal grandfather with prostate cancer.   Maternal grandmother with a seizure disorder.  PHYSICAL EXAMINATION:  GENERAL:  On admission reveals a well-developed, well-nourished black female, in no distress. VITAL SIGNS:  Temperature 97.9, pulse 86, respirations 18, blood pressure today is 121/76. HEART:  Normal size and sounds.  No murmurs. LUNGS:  Clear to auscultation. PELVIS:  At her last prenatal visit, the fundal height measured 39 cm. Fetal heart tones on the monitor today have little variability, and with the one contraction, there was a mild deceleration.  The cervix is a fingertip dilated, 30-40% effaced, vertex is at -3 station.  ADMITTING IMPRESSION:  Intrauterine pregnancy at 39+ weeks, recent history of borderline high blood pressure.  The patient is admitted for induction.  She also has a positive group B strep and will be treated with penicillin.     Malachi Pro. Ambrose Mantle, M.D.     TFH/MEDQ  D:  12/26/2011  T:  12/26/2011  Job:  [id number]

## 2011-12-26 NOTE — Progress Notes (Signed)
Patient ID: [name], female   DOB: [birth year], 21 y.o.   MRN: [id number] Contractions are 3-4 minutes apart and the pt has good relief from her epidural. Cervix is 7 cm 100% effaced and the vertex is at -1 station.

## 2011-12-27 LAB — CBC
HCT: 35.1 % — ABNORMAL LOW (ref 36.0–46.0)
Hemoglobin: 11.9 g/dL — ABNORMAL LOW (ref 12.0–15.0)
RBC: 3.63 MIL/uL — ABNORMAL LOW (ref 3.87–5.11)
WBC: 13.3 10*3/uL — ABNORMAL HIGH (ref 4.0–10.5)

## 2011-12-27 LAB — RPR: RPR Ser Ql: NONREACTIVE

## 2011-12-27 MED ORDER — MEASLES, MUMPS & RUBELLA VAC ~~LOC~~ INJ
0.5000 mL | INJECTION | Freq: Once | SUBCUTANEOUS | Status: DC
  Filled 2011-12-27: qty 0.5

## 2011-12-27 MED ORDER — BENZOCAINE-MENTHOL 20-0.5 % EX AERO
INHALATION_SPRAY | CUTANEOUS | Status: AC
  Administered 2011-12-27: 1 via TOPICAL
  Filled 2011-12-27: qty 56

## 2011-12-27 NOTE — Progress Notes (Signed)
Patient ID: [name], female   DOB: December 04, 1991, 21 y.o.   MRN: [id number] #1 afebrile BP normal HGB showed no drop. No complaints.

## 2011-12-27 NOTE — Anesthesia Postprocedure Evaluation (Signed)
  Anesthesia Post-op Note  Patient: [name]  Procedure(s) Performed: * No procedures listed *  Patient Location: PACU and Mother/Baby  Anesthesia Type: Epidural  Level of Consciousness: awake, alert  and oriented  Airway and Oxygen Therapy: Patient Spontanous Breathing  Post-op Pain: mild  Post-op Assessment: Post-op Vital signs reviewed  Post-op Vital Signs: stable  Complications: No apparent anesthesia complications

## 2011-12-28 MED ORDER — IBUPROFEN 600 MG PO TABS: 600.0000 mg | ORAL_TABLET | Freq: Four times a day (QID) | ORAL | Status: AC | PRN

## 2011-12-28 MED ORDER — OXYCODONE-ACETAMINOPHEN 5-325 MG PO TABS: 1.0000 | ORAL_TABLET | Freq: Four times a day (QID) | ORAL | Status: AC | PRN

## 2011-12-28 NOTE — Progress Notes (Signed)
Patient ID: [name], female   DOB: 08/17/1991, 21 y.o.   MRN: [id number] #2 afebrile no complaints for D/C

## 2011-12-29 ENCOUNTER — Ambulatory Visit (HOSPITAL_COMMUNITY)
Admission: RE | Admit: 2011-12-29 | Discharge: 2011-12-29 | Payer: BC Managed Care – PPO | Source: Ambulatory Visit | Attending: Obstetrics and Gynecology | Admitting: Obstetrics and Gynecology

## 2011-12-29 NOTE — Discharge Summary (Signed)
[name], [name]               ACCOUNT NO.:  [id number]  MEDICAL RECORD NO.:  [id number]  LOCATION:  9121                          FACILITY:  [facility]  PHYSICIAN:  Malachi Pro. Ambrose Mantle, M.D. DATE OF BIRTH:  08-22-1991  DATE OF ADMISSION:  12/26/2011 DATE OF DISCHARGE:  12/28/2011                              DISCHARGE SUMMARY   A 21 year old black female, para 0, gravida 1, EDC December 30, 2011, final ultrasound at 9 weeks and 3 days, was admitted for induction of labor.  Labs; blood group and type A positive, negative antibody, rubella immune, RPR nonreactive, urine culture negative.  Hepatitis B surface antigen negative, HIV negative, GC and Chlamydia negative, hemoglobin electrophoresis AA.  Cystic fibrosis screen was declined. First trimester screen negative, AFP negative, 1-hour Glucola 92, group B strep positive.  The patient had actually been treated for Chlamydia prior to her prenatal visit.  She had no significant past medical history after admission to the hospital.  She was placed on Pitocin and by 1:21 p.m., she was at 6 milliunits a minute, contractions every 2-4.5 minutes, cervix was 2 cm, 50%, vertex at a -2, and artificial rupture of the membranes produced clear fluid.  By 6:36 p.m., she had been 4 cm at 5 p.m., but declined an exam at 6:36 p.m.  She received an epidural by 9:11 p.m., contractions every 3-4 minutes.  Cervix was 7 cm.  By 9:48 p.m., she had become fully dilated, pushed well, brought the vertex down LOA and delivered a living female infant, LOA over an intact perineum by Dr. Ambrose Mantle.  The infant was a 7 pounds and 5 ounce female and Apgars of 9 and 9 at 1 and 5 minutes.  Placenta delivered intact.  Uterus was normal.  There were no lacerations.  Blood loss was 500 mL.  Postpartum, the patient did quite well and was discharged on the second postpartum day.  LABORATORY DATA:  Initial hemoglobin was 11.9, hematocrit 34.7, white count 9800, platelet count of  120,000.  RPR nonreactive.  Followup hemoglobin 11.9.  FINAL DIAGNOSES:  Intrauterine pregnancy.  FINAL DIAGNOSES:  Intrauterine pregnancy at 39+ weeks, delivered LOA.  OPERATION:  Spontaneous delivery LOA.  FINAL CONDITION:  Improved.  INSTRUCTIONS:  Our regular discharge instruction booklet.  The patient is advised to return to the office in 6 weeks for followup examination.  DISCHARGE MEDICATIONS:  Motrin 600 mg 30 tablets 1 every 6 hours as needed for pain and Percocet 5/325 15 tablets 1 every 6 hours as needed for pain.     Malachi Pro. Ambrose Mantle, M.D.     TFH/MEDQ  D:  12/28/2011  T:  12/29/2011  Job:  [id number]

## 2012-04-07 ENCOUNTER — Emergency Department (HOSPITAL_COMMUNITY): Payer: BC Managed Care – PPO

## 2012-04-07 ENCOUNTER — Encounter (HOSPITAL_COMMUNITY): Payer: Self-pay | Admitting: *Deleted

## 2012-04-07 ENCOUNTER — Emergency Department (HOSPITAL_COMMUNITY)
Admission: EM | Admit: 2012-04-07 | Discharge: 2012-04-07 | Disposition: A | Discharge status: Home / Self Care | Payer: BC Managed Care – PPO | Attending: Emergency Medicine | Admitting: Emergency Medicine

## 2012-04-07 DIAGNOSIS — R079 Chest pain, unspecified: Secondary | ICD-10-CM | POA: Insufficient documentation

## 2012-04-07 DIAGNOSIS — Z87891 Personal history of nicotine dependence: Secondary | ICD-10-CM | POA: Insufficient documentation

## 2012-04-07 DIAGNOSIS — J45909 Unspecified asthma, uncomplicated: Secondary | ICD-10-CM | POA: Insufficient documentation

## 2012-04-07 MED ORDER — GI COCKTAIL ~~LOC~~
30.0000 mL | Freq: Once | ORAL | Status: AC
  Administered 2012-04-07: 30 mL via ORAL
  Filled 2012-04-07: qty 30

## 2012-04-07 MED ORDER — OMEPRAZOLE 20 MG PO CPDR: 20.0000 mg | DELAYED_RELEASE_CAPSULE | Freq: Two times a day (BID) | ORAL | Status: DC

## 2012-04-07 NOTE — ED Notes (Signed)
Pt c/o chest discomfort in bilateral upper chest x 3 hrs, w/ cough x 1 month. Pt denies cardiac hx.

## 2012-04-07 NOTE — Discharge Instructions (Signed)
FOLLOW UP WITH YOUR DOCTOR OR WITH A PRIMARY CARE PHYSICIAN OF YOUR CHOICE IF PAIN CONTINUES. RECOMMEND PRILOSEC TWICE DAILY FOR 5 DAYS, THEN ONCE DAILY FOR SYMPTOMS. RETURN HERE WITH ANY SEVERE PAIN, SHORTNESS OF BREATH, HIGH FEVER OR NEW CONCERN.

## 2012-04-07 NOTE — ED Notes (Signed)
Sherry PA at bedside 

## 2012-04-07 NOTE — ED Provider Notes (Signed)
Medical screening examination/treatment/procedure(s) were performed by non-physician practitioner and as supervising physician I was immediately available for consultation/collaboration.   Hanley Seamen, MD 04/07/12 [phone]

## 2012-04-07 NOTE — ED Provider Notes (Signed)
History     CSN: [id number]  Arrival date & time 04/07/12  6962   First MD Initiated Contact with Patient 04/07/12 0404      Chief Complaint  Patient presents with  . Pleurisy    (Consider location/radiation/quality/duration/timing/severity/associated sxs/prior treatment) Patient is a 21 y.o. female presenting with chest pain. The history is provided by the patient.  Chest Pain The chest pain began yesterday. Chest pain occurs intermittently. The quality of the pain is described as pressure-like and sharp. Pertinent negatives for primary symptoms include no fever, no shortness of breath and no abdominal pain. Associated symptoms comments: She reports similar episodes of shorter duration for weeks, occurring approximately once per week. Last night the pain lasted longer than usual. No cough, fever, pain with breathing, nausea. She is a smoker..     Past Medical History  Diagnosis Date  . No pertinent past medical history   . Asthma   . Anemia   . History of chlamydia     Past Surgical History  Procedure Date  . No past surgeries     Family History  Problem Relation Age of Onset  . Anesthesia problems Mother   . Arthritis Mother   . Asthma Brother   . Seizures Maternal Aunt   . Anesthesia problems Maternal Aunt   . Hypertension Paternal Aunt   . Cancer Paternal Aunt     lung  . Hypertension Paternal Uncle   . Cancer Maternal Grandmother     cervix  . Seizures Maternal Grandmother   . Hypertension Maternal Grandfather   . Cancer Maternal Grandfather     prostate  . Hypertension Paternal Grandfather   . Mental retardation Cousin     cp x2    History  Substance Use Topics  . Smoking status: Former Smoker -- 0.5 packs/day for 2 years    Types: Cigarettes    Quit date: 04/25/2011  . Smokeless tobacco: Never Used  . Alcohol Use: No    OB History    Grav Para Term Preterm Abortions TAB SAB Ect Mult Living   1 1 1       1       Review of Systems    Constitutional: Negative for fever.  Respiratory: Negative for shortness of breath.   Cardiovascular: Positive for chest pain. Negative for leg swelling.  Gastrointestinal: Negative for abdominal pain.  Musculoskeletal: Negative for back pain.    Allergies  Review of patient's allergies indicates no known allergies.  Home Medications   Current Outpatient Rx  Name Route Sig Dispense Refill  . MEDROXYPROGESTERONE ACETATE 150 MG/ML IM SUSP Intramuscular Inject 150 mg into the muscle every 3 (three) months.      BP 136/78  Pulse 68  Temp(Src) 98.2 F (36.8 C) (Oral)  Resp 16  SpO2 100%  LMP 04/03/2012  Breastfeeding? No  Physical Exam  Constitutional: She appears well-developed and well-nourished.  HENT:  Head: Normocephalic.  Neck: Normal range of motion.  Cardiovascular: Normal rate and regular rhythm.   Pulmonary/Chest: Effort normal and breath sounds normal. She exhibits no tenderness.  Abdominal: Soft. Bowel sounds are normal. There is no tenderness. There is no rebound and no guarding.  Musculoskeletal: Normal range of motion. She exhibits no edema.  Neurological: She is alert.  Skin: Skin is warm and dry.  Psychiatric: She has a normal mood and affect.    ED Course  Procedures (including critical care time)  Labs Reviewed - No data to display Dg Chest 2  View  04/07/2012  *RADIOLOGY REPORT*  Clinical Data: Left upper chest pain and pressure for 4 hours. Shortness of breath.  CHEST - 2 VIEW  Comparison: None.  Findings: The heart size and pulmonary vascularity are normal. The lungs appear clear and expanded without focal air space disease or consolidation. No blunting of the costophrenic angles.  No pneumothorax.  IMPRESSION: No evidence of active pulmonary disease.  Original Report Authenticated By: Marlon Pel, M.D.    Date: 04/07/2012  Rate: 71  Rhythm: normal sinus rhythm  QRS Axis: normal  Intervals: normal  ST/T Wave abnormalities: normal   Conduction Disutrbances:none  Narrative Interpretation:   Old EKG Reviewed: none available    No diagnosis found. 1. Nonspecific chest pain 2. Tobacco abuse  MDM  The patient has had intermittent symptoms x weeks. She is on birth control and is a smoker, but denies pleuritic chest pain, SOB, is not tachycardic and has O2 saturation of 100% - doubt PE. CXR negative for infection. Feel the chest pain is benign and she can be discharged home to follow up with a primary care doctor of her choice for further outpatient management.         Rodena Medin, PA-C 04/07/12 [phone]

## 2012-07-17 ENCOUNTER — Encounter (HOSPITAL_COMMUNITY): Payer: Self-pay | Admitting: Emergency Medicine

## 2012-07-17 ENCOUNTER — Emergency Department (HOSPITAL_COMMUNITY)
Admission: EM | Admit: 2012-07-17 | Discharge: 2012-07-17 | Disposition: A | Discharge status: Home / Self Care | Payer: BC Managed Care – PPO | Attending: Emergency Medicine | Admitting: Emergency Medicine

## 2012-07-17 ENCOUNTER — Emergency Department (HOSPITAL_COMMUNITY): Payer: BC Managed Care – PPO

## 2012-07-17 DIAGNOSIS — M24419 Recurrent dislocation, unspecified shoulder: Secondary | ICD-10-CM

## 2012-07-17 DIAGNOSIS — F17200 Nicotine dependence, unspecified, uncomplicated: Secondary | ICD-10-CM | POA: Insufficient documentation

## 2012-07-17 NOTE — ED Notes (Signed)
Patient discharge via ambulatory with a steady gait. Respirations equal and unlabored. Skin warm and dry. No acute distress noted. 

## 2012-07-17 NOTE — ED Provider Notes (Signed)
Medical screening examination/treatment/procedure(s) were performed by non-physician practitioner and as supervising physician I was immediately available for consultation/collaboration.  Flint Melter, MD 07/17/12 [phone]

## 2012-07-17 NOTE — ED Notes (Signed)
Patient states that she was rocking the baby and she she heard her arm pop. Patient has a history of dislocations

## 2012-07-17 NOTE — ED Provider Notes (Signed)
History     CSN: [id number]  Arrival date & time 07/17/12  0246   None     Chief Complaint  Patient presents with  . Shoulder Pain    (Consider location/radiation/quality/duration/timing/severity/associated sxs/prior treatment) HPI  21 y/o female INAD c/o left shoulder pain acute onset while lifting her baby several hours ago. At onset pt states there was a low hanging deformity of the shoulder and she tried to reduce it herself. Pain is 3/10, described as sore, onset at 2:15 AM pain improved from onset pain level of 8/10. Remote single episode of dislocation as a child. Right hand dominant. Denies numbness and parastheasia  Past Medical History  Diagnosis Date  . No pertinent past medical history   . Asthma   . Anemia   . History of chlamydia     Past Surgical History  Procedure Date  . No past surgeries     Family History  Problem Relation Age of Onset  . Anesthesia problems Mother   . Arthritis Mother   . Asthma Brother   . Seizures Maternal Aunt   . Anesthesia problems Maternal Aunt   . Hypertension Paternal Aunt   . Cancer Paternal Aunt     lung  . Hypertension Paternal Uncle   . Cancer Maternal Grandmother     cervix  . Seizures Maternal Grandmother   . Hypertension Maternal Grandfather   . Cancer Maternal Grandfather     prostate  . Hypertension Paternal Grandfather   . Mental retardation Cousin     cp x2    History  Substance Use Topics  . Smoking status: Current Everyday Smoker -- 0.5 packs/day for 2 years    Types: Cigarettes  . Smokeless tobacco: Never Used  . Alcohol Use: No    OB History    Grav Para Term Preterm Abortions TAB SAB Ect Mult Living   1 1 1       1       Review of Systems  Musculoskeletal: Positive for joint swelling.  Neurological: Negative for numbness.  All other systems reviewed and are negative.    Allergies  Review of patient's allergies indicates no known allergies.  Home Medications   Current Outpatient  Rx  Name Route Sig Dispense Refill  . MEDROXYPROGESTERONE ACETATE 150 MG/ML IM SUSP Intramuscular Inject 150 mg into the muscle every 3 (three) months. Patient states that her last shot was 2 weeks ago      BP 124/76  Pulse 81  Temp 98.7 F (37.1 C) (Oral)  Resp 18  SpO2 100%  Physical Exam  Nursing note and vitals reviewed. Constitutional: She is oriented to person, place, and time. She appears well-developed and well-nourished. No distress.  HENT:  Head: Normocephalic.  Eyes: Conjunctivae and EOM are normal.  Cardiovascular: Normal rate.   Pulmonary/Chest: Effort normal.  Musculoskeletal: Normal range of motion.       No deformities to left shoulder, sensation intact to all nerve distributions of the left arm. Radial pulses 2+  Neurological: She is alert and oriented to person, place, and time.  Psychiatric: She has a normal mood and affect.    ED Course  Procedures (including critical care time)  Labs Reviewed - No data to display Dg Shoulder Left  07/17/2012  *RADIOLOGY REPORT*  Clinical Data: Left shoulder pain  LEFT SHOULDER - 2+ VIEW  Comparison: None.  Findings: No fracture or dislocation.  No aggressive osseous lesion.  Left upper lung is clear.  IMPRESSION: No acute  osseous abnormality of the left shoulder.  Original Report Authenticated By: Waneta Martins, M.D.     1. Dislocation, shoulder, recurrent       MDM  Self reduced dislocation of left shoulder with out complication. I will immobilize the shoulder and refer for ortho follow-up.  Pt verbalized understanding and agrees with care plan. Outpatient follow-up and return precautions given.           Wynetta Emery, PA-C 07/17/12 1512

## 2012-10-05 IMAGING — US US OB COMP LESS 14 WK
1 series · 14 of 27 positions shown · non-contrast
Comparison: none

[Series 1: us ob comp less 14 wks · 14 of 27 slices shown]
[im 1/27]
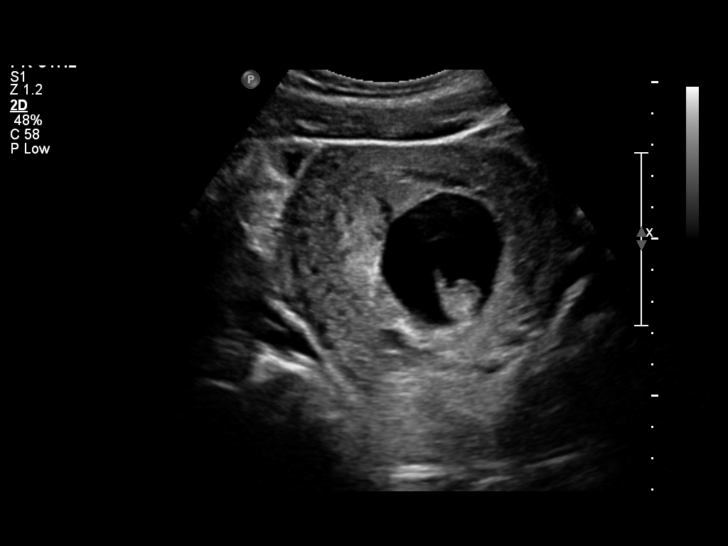
[im 3/27]
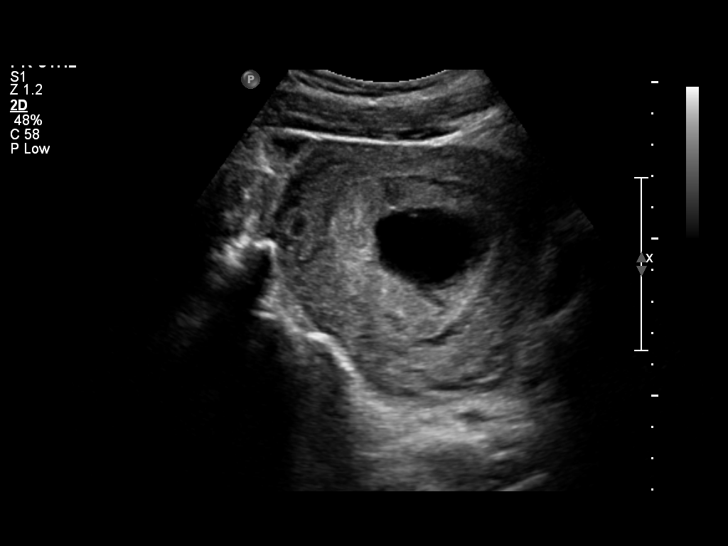
[im 5/27]
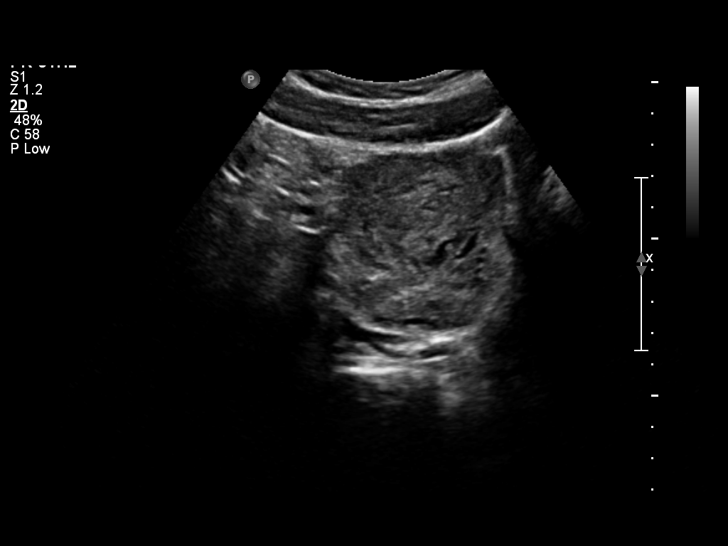
[im 7/27]
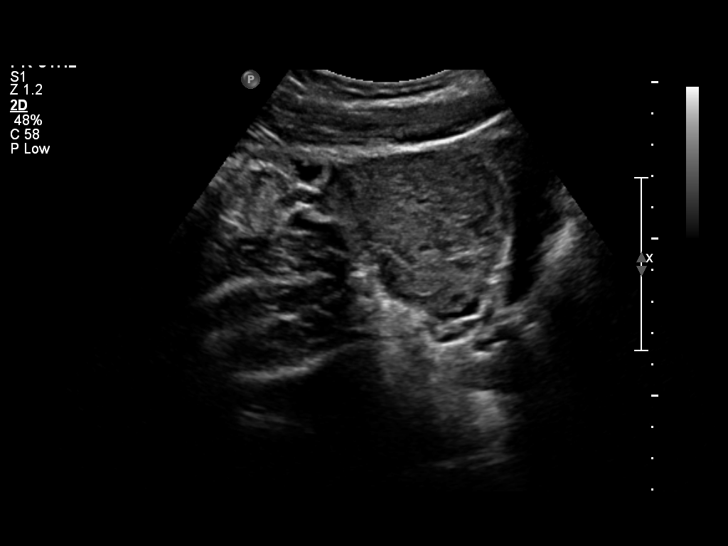
[im 9/27]
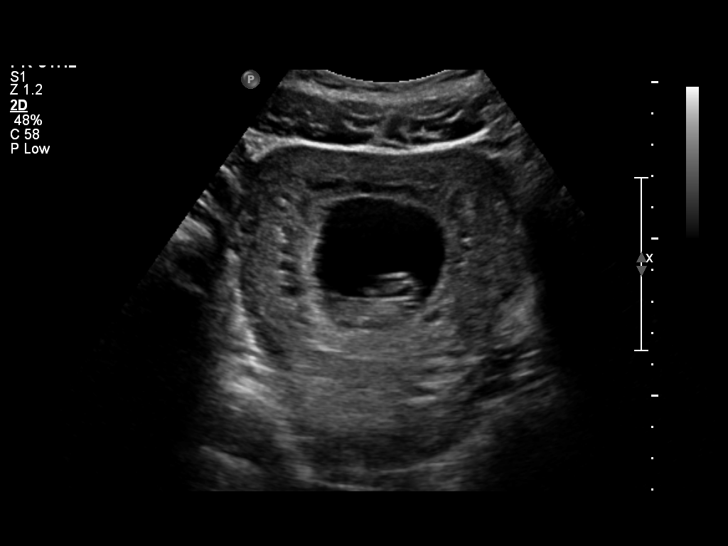
[im 11/27]
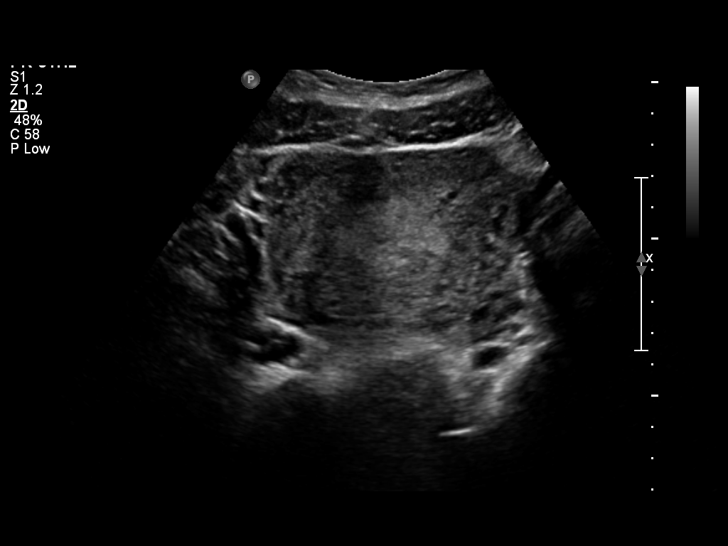
[im 13/27]
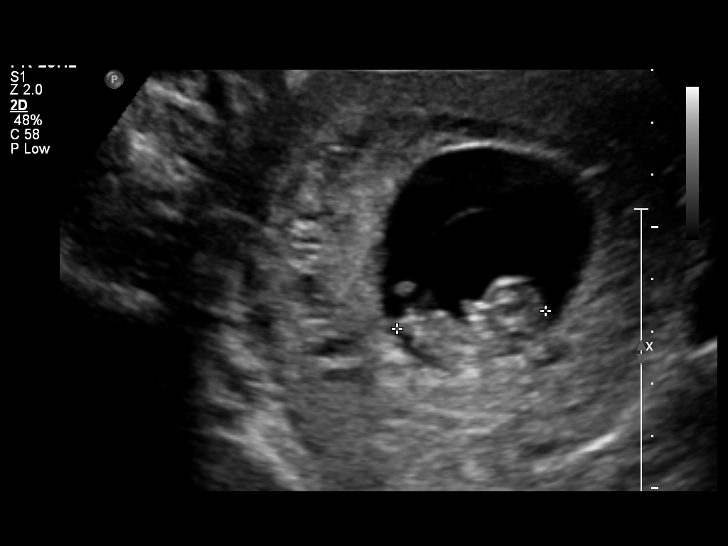
[im 15/27]
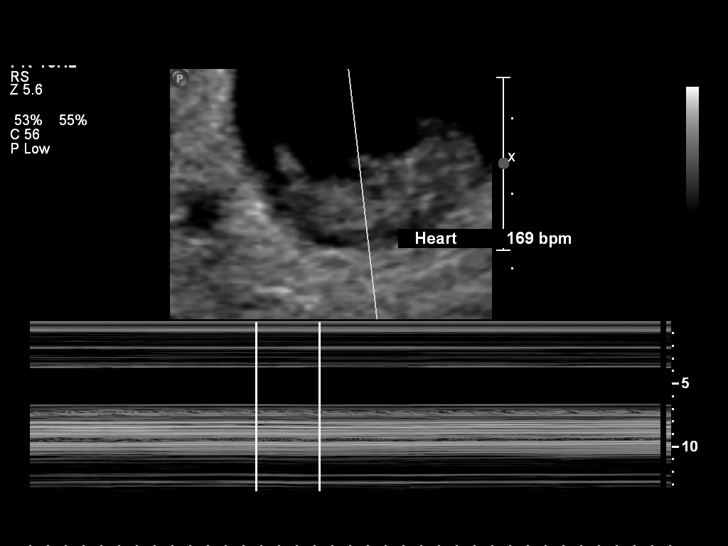
[im 17/27]
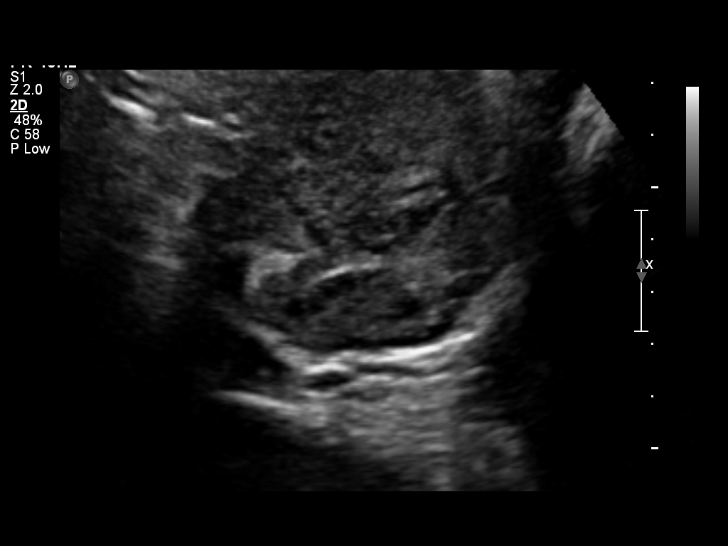
[im 19/27]
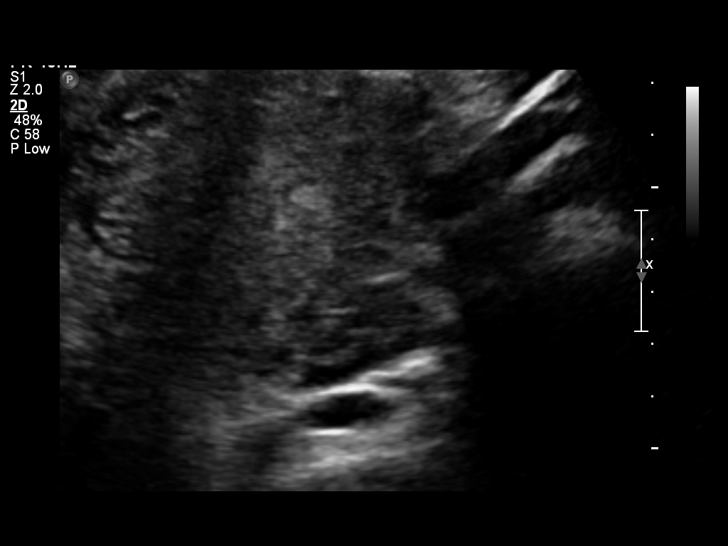
[im 21/27]
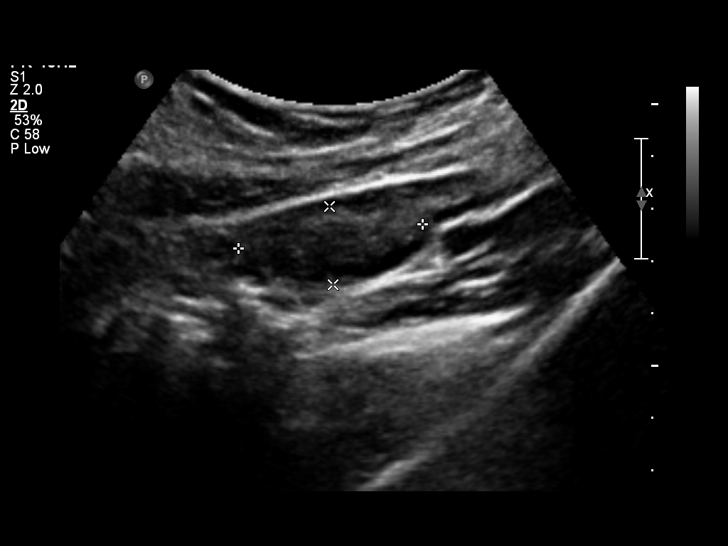
[im 23/27]
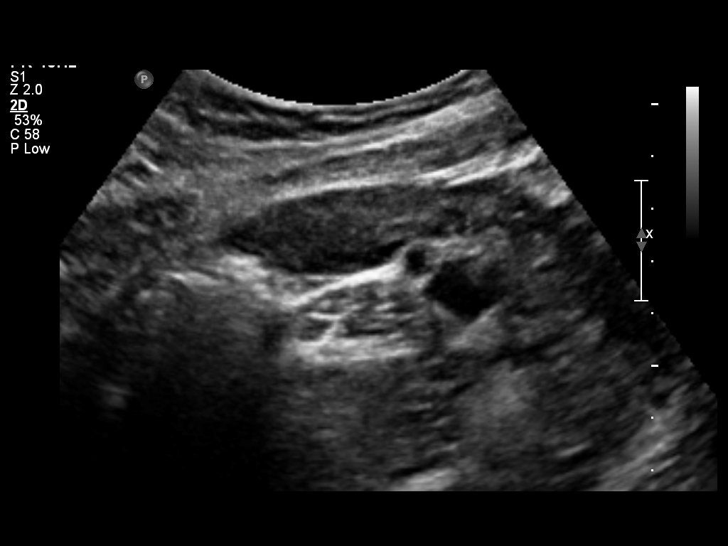
[im 25/27]
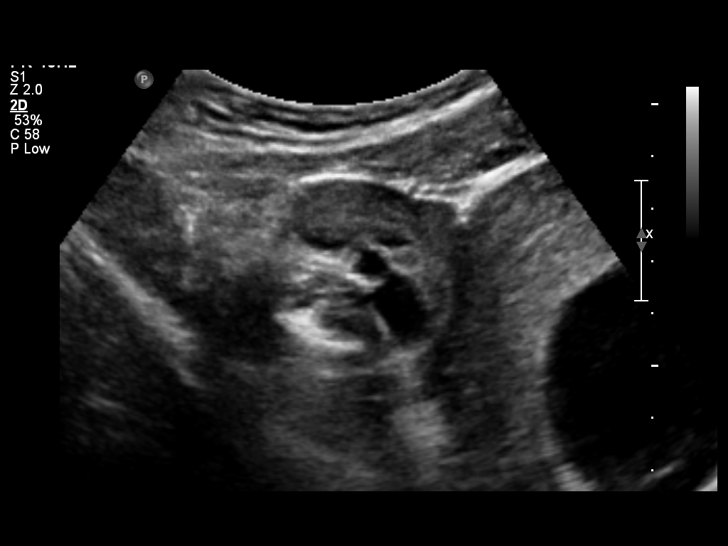
[im 27/27]
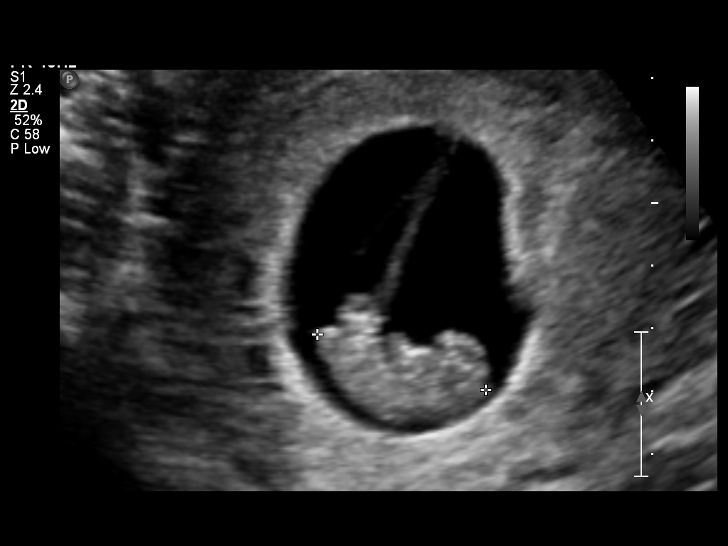

[14 of 27 positions shown; findings below may reference images not displayed]

OBSTETRICS REPORT
                      (Signed Final 05/30/2011 [DATE])

 Order#:         [id number]
Procedures

 US OB COMP LESS 14 WKS                                76801.0
Indications

 Uncertain LMP;  Establish Gestational [AGE]
Fetal Evaluation

 Gest. Sac:         Intrauterine
 Yolk Sac:          Visualized
 Fetal Pole:        Visualized
 Fetal Heart Rate:  169                          bpm
 Cardiac Activity:  Observed

 Amniotic Fluid
 AFI FV:      Subjectively within normal limits
Biometry

 CRL:     28.2  mm     G. Age:  9w 3d                  EDD:    12/30/11
Gestational Age

 LMP:           11w 0d        Date:  03/14/11                 EDD:   12/19/11
 Best:          9w 3d      Det. By:  U/S C R L (05/30/11)     EDD:   12/30/11
Cervix Uterus Adnexa

 Uterus:       No abnormality visualized.
 Cul De Sac:   No free fluid seen.

 Left Ovary:    Within normal limits.
 Right Ovary:   Within normal limits.
 Adnexa:     No abnormality visualized.
Impression

 Single living IUP with US Gest. Age of 9w 3d, and EDD of
 12/30/2011.
 No significant maternal uterine or adnexal abnormality
 identified.
 questions or concerns.

## 2013-03-22 ENCOUNTER — Encounter (HOSPITAL_COMMUNITY): Payer: Self-pay | Admitting: *Deleted

## 2013-03-22 ENCOUNTER — Inpatient Hospital Stay (HOSPITAL_COMMUNITY)
Admission: AD | Admit: 2013-03-22 | Discharge: 2013-03-22 | Disposition: A | Discharge status: Home / Self Care | Payer: BC Managed Care – PPO | Source: Ambulatory Visit | Attending: Obstetrics and Gynecology | Admitting: Obstetrics and Gynecology

## 2013-03-22 DIAGNOSIS — N760 Acute vaginitis: Secondary | ICD-10-CM | POA: Insufficient documentation

## 2013-03-22 DIAGNOSIS — M549 Dorsalgia, unspecified: Secondary | ICD-10-CM | POA: Insufficient documentation

## 2013-03-22 DIAGNOSIS — L293 Anogenital pruritus, unspecified: Secondary | ICD-10-CM | POA: Insufficient documentation

## 2013-03-22 DIAGNOSIS — A499 Bacterial infection, unspecified: Secondary | ICD-10-CM

## 2013-03-22 DIAGNOSIS — R109 Unspecified abdominal pain: Secondary | ICD-10-CM | POA: Insufficient documentation

## 2013-03-22 DIAGNOSIS — B9689 Other specified bacterial agents as the cause of diseases classified elsewhere: Secondary | ICD-10-CM | POA: Insufficient documentation

## 2013-03-22 LAB — URINALYSIS, ROUTINE W REFLEX MICROSCOPIC
Glucose, UA: NEGATIVE mg/dL
Leukocytes, UA: NEGATIVE
Protein, ur: NEGATIVE mg/dL

## 2013-03-22 LAB — WET PREP, GENITAL
WBC, Wet Prep HPF POC: NONE SEEN
Yeast Wet Prep HPF POC: NONE SEEN

## 2013-03-22 MED ORDER — METRONIDAZOLE 500 MG PO TABS: 500.0000 mg | ORAL_TABLET | Freq: Two times a day (BID) | ORAL | Status: DC

## 2013-03-22 NOTE — Progress Notes (Signed)
Thressa Sheller CNM in to discuss lab results and d/c plan. Written and verbal d/c instructions given and understanding voiced.

## 2013-03-22 NOTE — MAU Provider Note (Signed)
History     CSN: [id number]  Arrival date and time: 03/22/13 0109   None     Chief Complaint  Patient presents with  . Abdominal Pain  . Back Pain   HPI  [name] is a 22 y.o. G1P1001 who presents today with lower abdominal pain/pressure and fullness for 1 week. She also states that when she voids she is only able to void small amounts. She reports vulvar itching, and a thin watery discharge. She denies any odor.   Past Medical History  Diagnosis Date  . No pertinent past medical history   . Asthma   . Anemia   . History of chlamydia     Past Surgical History  Procedure Laterality Date  . No past surgeries      Family History  Problem Relation Age of Onset  . Anesthesia problems Mother   . Arthritis Mother   . Asthma Brother   . Seizures Maternal Aunt   . Anesthesia problems Maternal Aunt   . Hypertension Paternal Aunt   . Cancer Paternal Aunt     lung  . Hypertension Paternal Uncle   . Cancer Maternal Grandmother     cervix  . Seizures Maternal Grandmother   . Hypertension Maternal Grandfather   . Cancer Maternal Grandfather     prostate  . Hypertension Paternal Grandfather   . Mental retardation Cousin     cp x2    History  Substance Use Topics  . Smoking status: Current Every Day Smoker -- 0.50 packs/day for 2 years    Types: Cigarettes  . Smokeless tobacco: Never Used  . Alcohol Use: No    Allergies: No Known Allergies  Prescriptions prior to admission  Medication Sig Dispense Refill  . loratadine (CLARITIN) 10 MG tablet Take 10 mg by mouth daily.      . medroxyPROGESTERone (DEPO-PROVERA) 150 MG/ML injection Inject 150 mg into the muscle every 3 (three) months. Patient states that her last shot was 2 weeks ago        Review of Systems  Constitutional: Negative for fever.  Gastrointestinal: Positive for nausea, abdominal pain and constipation. Negative for vomiting and diarrhea.  Genitourinary: Positive for dysuria, urgency and  frequency.  Neurological: Negative for dizziness.   Physical Exam   Blood pressure 137/83, pulse 85, temperature 98.4 F (36.9 C), resp. rate 20, height 5' 5.5" (1.664 m), weight 167 lb 6.4 oz (75.932 kg), last menstrual period 02/27/2013, SpO2 100.00%.  Physical Exam  Nursing note and vitals reviewed. Constitutional: She is oriented to person, place, and time. She appears well-developed and well-nourished.  Cardiovascular: Normal rate.   Respiratory: Effort normal.  GI: Soft.  Genitourinary:   External: no lesion Vagina: small amount of thin white discharge Cervix: pink, smooth. No CMT Uterus: AV, NSSC Adnexa: NT  Neurological: She is alert and oriented to person, place, and time.  Skin: Skin is warm and dry.  Psychiatric: She has a normal mood and affect.    MAU Course  Procedures  Results for orders placed during the hospital encounter of 03/22/13 (from the past 24 hour(s))  URINALYSIS, ROUTINE W REFLEX MICROSCOPIC     Status: None   Collection Time    03/22/13  1:40 AM      Result Value Range   Color, Urine YELLOW  YELLOW   APPearance CLEAR  CLEAR   Specific Gravity, Urine 1.010  1.005 - 1.030   pH 6.0  5.0 - 8.0   Glucose, UA  NEGATIVE  NEGATIVE mg/dL   Hgb urine dipstick NEGATIVE  NEGATIVE   Bilirubin Urine NEGATIVE  NEGATIVE   Ketones, ur NEGATIVE  NEGATIVE mg/dL   Protein, ur NEGATIVE  NEGATIVE mg/dL   Urobilinogen, UA 0.2  0.0 - 1.0 mg/dL   Nitrite NEGATIVE  NEGATIVE   Leukocytes, UA NEGATIVE  NEGATIVE  POCT PREGNANCY, URINE     Status: None   Collection Time    03/22/13  1:42 AM      Result Value Range   Preg Test, Ur NEGATIVE  NEGATIVE  WET PREP, GENITAL     Status: Abnormal   Collection Time    03/22/13  1:55 AM      Result Value Range   Yeast Wet Prep HPF POC NONE SEEN  NONE SEEN   Trich, Wet Prep NONE SEEN  NONE SEEN   Clue Cells Wet Prep HPF POC MODERATE (*) NONE SEEN   WBC, Wet Prep HPF POC NONE SEEN  NONE SEEN     Assessment and Plan    1. BV (bacterial vaginosis)    RX: Flagyl 500mg  PO BID x 7 FU with primary OBGYN as needed or if sx worsen  Tawnya Crook 03/22/2013, 1:44 AM

## 2013-03-22 NOTE — MAU Note (Signed)
I've had abdominal pain for a week that is getting worse. Now having some back pain as well. Some clear vag d/c with itching

## 2013-06-11 ENCOUNTER — Emergency Department (HOSPITAL_BASED_OUTPATIENT_CLINIC_OR_DEPARTMENT_OTHER)
Admission: EM | Admit: 2013-06-11 | Discharge: 2013-06-11 | Disposition: A | Discharge status: Home / Self Care | Payer: BC Managed Care – PPO | Attending: Emergency Medicine | Admitting: Emergency Medicine

## 2013-06-11 ENCOUNTER — Encounter (HOSPITAL_BASED_OUTPATIENT_CLINIC_OR_DEPARTMENT_OTHER): Payer: Self-pay | Admitting: Emergency Medicine

## 2013-06-11 DIAGNOSIS — J45909 Unspecified asthma, uncomplicated: Secondary | ICD-10-CM | POA: Insufficient documentation

## 2013-06-11 DIAGNOSIS — H109 Unspecified conjunctivitis: Secondary | ICD-10-CM | POA: Insufficient documentation

## 2013-06-11 DIAGNOSIS — F17200 Nicotine dependence, unspecified, uncomplicated: Secondary | ICD-10-CM | POA: Insufficient documentation

## 2013-06-11 DIAGNOSIS — Z8619 Personal history of other infectious and parasitic diseases: Secondary | ICD-10-CM | POA: Insufficient documentation

## 2013-06-11 DIAGNOSIS — H5789 Other specified disorders of eye and adnexa: Secondary | ICD-10-CM | POA: Insufficient documentation

## 2013-06-11 DIAGNOSIS — Z792 Long term (current) use of antibiotics: Secondary | ICD-10-CM | POA: Insufficient documentation

## 2013-06-11 DIAGNOSIS — Z862 Personal history of diseases of the blood and blood-forming organs and certain disorders involving the immune mechanism: Secondary | ICD-10-CM | POA: Insufficient documentation

## 2013-06-11 MED ORDER — TOBRAMYCIN 0.3 % OP SOLN: 1.0000 [drp] | OPHTHALMIC | Status: DC

## 2013-06-11 MED ORDER — HYDROCODONE-ACETAMINOPHEN 5-325 MG PO TABS
2.0000 | ORAL_TABLET | Freq: Once | ORAL | Status: AC
  Administered 2013-06-11: 2 via ORAL
  Filled 2013-06-11: qty 2

## 2013-06-11 MED ORDER — HYDROCODONE-ACETAMINOPHEN 5-325 MG PO TABS: 2.0000 | ORAL_TABLET | ORAL | Status: DC | PRN

## 2013-06-11 NOTE — ED Notes (Signed)
Pt c/o redness, pain and drainage from left eye since yesterday

## 2013-06-11 NOTE — ED Provider Notes (Signed)
History    CSN: [id number] Arrival date & time 06/11/13  4098  First MD Initiated Contact with Patient 06/11/13 1904     Chief Complaint  Patient presents with  . Eye Pain   (Consider location/radiation/quality/duration/timing/severity/associated sxs/prior Treatment) Patient is a 22 y.o. female presenting with eye pain. The history is provided by the patient.  Eye Pain This is a new problem. The current episode started yesterday. The problem occurs constantly. The problem has been gradually worsening. Nothing aggravates the symptoms. She has tried nothing for the symptoms. The treatment provided moderate relief.  Pt reports reness and drainage from left eye.   Pt reports she had the same a month ago and was treated with eye drops by eye doctor.   Pt report she has a history of allergies. Past Medical History  Diagnosis Date  . No pertinent past medical history   . Asthma   . Anemia   . History of chlamydia    Past Surgical History  Procedure Laterality Date  . No past surgeries     Family History  Problem Relation Age of Onset  . Anesthesia problems Mother   . Arthritis Mother   . Asthma Brother   . Seizures Maternal Aunt   . Anesthesia problems Maternal Aunt   . Hypertension Paternal Aunt   . Cancer Paternal Aunt     lung  . Hypertension Paternal Uncle   . Cancer Maternal Grandmother     cervix  . Seizures Maternal Grandmother   . Hypertension Maternal Grandfather   . Cancer Maternal Grandfather     prostate  . Hypertension Paternal Grandfather   . Mental retardation Cousin     cp x2   History  Substance Use Topics  . Smoking status: Current Every Day Smoker -- 0.50 packs/day for 2 years    Types: Cigarettes  . Smokeless tobacco: Never Used  . Alcohol Use: No   OB History   Grav Para Term Preterm Abortions TAB SAB Ect Mult Living   1 1 1       1      Review of Systems  Eyes: Positive for pain, discharge and redness.  All other systems reviewed and are  negative.    Allergies  Review of patient's allergies indicates no known allergies.  Home Medications   Current Outpatient Rx  Name  Route  Sig  Dispense  Refill  . acetaminophen (TYLENOL) 325 MG tablet   Oral   Take 650 mg by mouth every 6 (six) hours as needed for pain.         . fexofenadine (ALLEGRA) 30 MG tablet   Oral   Take 30 mg by mouth 2 (two) times daily.         Marland Kitchen loratadine (CLARITIN) 10 MG tablet   Oral   Take 10 mg by mouth daily.         . medroxyPROGESTERone (DEPO-PROVERA) 150 MG/ML injection   Intramuscular   Inject 150 mg into the muscle every 3 (three) months. Patient states that her last shot was 2 weeks ago         . metroNIDAZOLE (FLAGYL) 500 MG tablet   Oral   Take 1 tablet (500 mg total) by mouth 2 (two) times daily.   14 tablet   0    BP 130/79  Pulse 94  Temp(Src) 98.4 F (36.9 C) (Oral)  Resp 20  SpO2 100% Physical Exam  Nursing note and vitals reviewed. Constitutional: She appears well-developed  and well-nourished.  Eyes: EOM and lids are normal. Pupils are equal, round, and reactive to light. No foreign bodies found. Left eye exhibits exudate. No foreign body present in the left eye. Left conjunctiva is injected.  Neck: Normal range of motion. Neck supple.  Cardiovascular: Normal rate.   Pulmonary/Chest: Effort normal.  Neurological: She is alert.  Skin: Skin is warm.    ED Course  Procedures (including critical care time) Labs Reviewed - No data to display No results found. No diagnosis found.  MDM  Pt given 2 hydrocodone for discomfort,   Rx for tobrex opth and hydrocodone.   Pt advised to see her eye doctor for 24 hour recheck.    Lonia Skinner Dumont, PA-C 06/11/13 1934

## 2013-06-12 NOTE — ED Provider Notes (Signed)
Medical screening examination/treatment/procedure(s) were performed by non-physician practitioner and as supervising physician I was immediately available for consultation/collaboration.   Jadin Kagel, MD 06/12/13 2350 

## 2013-10-23 ENCOUNTER — Other Ambulatory Visit: Payer: Self-pay

## 2014-07-23 ENCOUNTER — Encounter (HOSPITAL_COMMUNITY): Payer: Self-pay | Admitting: Emergency Medicine

## 2014-07-23 ENCOUNTER — Emergency Department (INDEPENDENT_AMBULATORY_CARE_PROVIDER_SITE_OTHER)
Admission: EM | Admit: 2014-07-23 | Discharge: 2014-07-23 | Disposition: A | Discharge status: Home / Self Care | Payer: Self-pay | Source: Home / Self Care | Attending: Family Medicine | Admitting: Family Medicine

## 2014-07-23 DIAGNOSIS — H1010 Acute atopic conjunctivitis, unspecified eye: Secondary | ICD-10-CM

## 2014-07-23 DIAGNOSIS — H1045 Other chronic allergic conjunctivitis: Secondary | ICD-10-CM

## 2014-07-23 MED ORDER — KETOROLAC TROMETHAMINE 0.5 % OP SOLN: 1.0000 [drp] | Freq: Four times a day (QID) | OPHTHALMIC | Status: DC

## 2014-07-23 MED ORDER — IPRATROPIUM BROMIDE 0.06 % NA SOLN: 2.0000 | Freq: Four times a day (QID) | NASAL | Status: DC

## 2014-07-23 NOTE — ED Provider Notes (Signed)
CSN: [id number]     Arrival date & time 07/23/14  1952 History   First MD Initiated Contact with Patient 07/23/14 2021     Chief Complaint  Patient presents with  . Eye Problem   (Consider location/radiation/quality/duration/timing/severity/associated sxs/prior Treatment) Patient is a 23 y.o. female presenting with eye problem. The history is provided by the patient and a parent.  Eye Problem Location:  Both Quality:  Burning and tearing Severity:  Moderate Onset quality:  Gradual Duration:  2 days Chronicity:  Chronic Context comment:  H/o same, related to movig from [place], to [place], and environ changes with a/c and fans, keeps apt cold. according to mother. Relieved by:  Nothing Worsened by:  Nothing tried Ineffective treatments:  None tried Associated symptoms: blurred vision, inflammation, redness and tearing     Past Medical History  Diagnosis Date  . No pertinent past medical history   . Asthma   . Anemia   . History of chlamydia    Past Surgical History  Procedure Laterality Date  . No past surgeries     Family History  Problem Relation Age of Onset  . Anesthesia problems Mother   . Arthritis Mother   . Asthma Brother   . Seizures Maternal Aunt   . Anesthesia problems Maternal Aunt   . Hypertension Paternal Aunt   . Cancer Paternal Aunt     lung  . Hypertension Paternal Uncle   . Cancer Maternal Grandmother     cervix  . Seizures Maternal Grandmother   . Hypertension Maternal Grandfather   . Cancer Maternal Grandfather     prostate  . Hypertension Paternal Grandfather   . Mental retardation Cousin     cp x2   History  Substance Use Topics  . Smoking status: Current Every Day Smoker -- 0.50 packs/day for 2 years    Types: Cigarettes  . Smokeless tobacco: Never Used  . Alcohol Use: No   OB History   Grav Para Term Preterm Abortions TAB SAB Ect Mult Living   1 1 1       1      Review of Systems  Constitutional: Negative.   HENT: Negative.   Eyes:  Positive for blurred vision, pain, redness and visual disturbance.  Respiratory: Negative.     Allergies  Review of patient's allergies indicates no known allergies.  Home Medications   Prior to Admission medications   Medication Sig Start Date End Date Taking? Authorizing Provider  acetaminophen (TYLENOL) 325 MG tablet Take 650 mg by mouth every 6 (six) hours as needed for pain.    Historical Provider, MD  fexofenadine (ALLEGRA) 30 MG tablet Take 30 mg by mouth 2 (two) times daily.    Historical Provider, MD  HYDROcodone-acetaminophen (NORCO/VICODIN) 5-325 MG per tablet Take 2 tablets by mouth every 4 (four) hours as needed for pain. 06/11/13   Elson Areas, PA-C  ipratropium (ATROVENT) 0.06 % nasal spray Place 2 sprays into both nostrils 4 (four) times daily. 07/23/14   Linna Hoff, MD  ketorolac (ACULAR) 0.5 % ophthalmic solution Place 1 drop into both eyes 4 (four) times daily. 07/23/14   Linna Hoff, MD  loratadine (CLARITIN) 10 MG tablet Take 10 mg by mouth daily.    Historical Provider, MD  medroxyPROGESTERone (DEPO-PROVERA) 150 MG/ML injection Inject 150 mg into the muscle every 3 (three) months. Patient states that her last shot was 2 weeks ago    Historical Provider, MD  metroNIDAZOLE (FLAGYL) 500 MG tablet  Take 1 tablet (500 mg total) by mouth 2 (two) times daily. 03/22/13   Heather Alger Memosonovan Hogan, CNM  tobramycin (TOBREX) 0.3 % ophthalmic solution Place 1 drop into the left eye every 4 (four) hours. 06/11/13   Elson AreasLeslie K Sofia, PA-C   BP 128/81  Pulse 86  Temp(Src) 98.7 F (37.1 C) (Oral)  Resp 18  SpO2 100% Physical Exam  Nursing note and vitals reviewed. Constitutional: She is oriented to person, place, and time. She appears well-developed and well-nourished.  HENT:  Mouth/Throat: Oropharynx is clear and moist.  Eyes: Conjunctivae and EOM are normal. Pupils are equal, round, and reactive to light. Right eye exhibits no discharge. Left eye exhibits no discharge.  Neck: Normal  range of motion. Neck supple.  Lymphadenopathy:    She has no cervical adenopathy.  Neurological: She is alert and oriented to person, place, and time.  Skin: Skin is warm and dry.    ED Course  Procedures (including critical care time) Labs Review Labs Reviewed - No data to display  Imaging Review No results found.   MDM   1. Seasonal allergic conjunctivitis        Linna HoffJames D Chevonne Bostrom, MD 07/23/14 2050

## 2014-07-23 NOTE — Discharge Instructions (Signed)
Use medicine as prescribed, see your eye doctor if further problems. °

## 2014-07-23 NOTE — ED Notes (Signed)
Pt c/o bilateral eye irritation x 2 days w/a gray film over eyes Vision blurry and red [facility]; dx w/conjuctivitis  Alert w/no signs of acute distress.

## 2014-10-19 ENCOUNTER — Encounter (HOSPITAL_COMMUNITY): Payer: Self-pay | Admitting: Emergency Medicine

## 2015-08-25 ENCOUNTER — Emergency Department (HOSPITAL_COMMUNITY)
Admission: EM | Admit: 2015-08-25 | Discharge: 2015-08-26 | Disposition: A | Discharge status: Home / Self Care | Payer: BLUE CROSS/BLUE SHIELD | Attending: Emergency Medicine | Admitting: Emergency Medicine

## 2015-08-25 DIAGNOSIS — J069 Acute upper respiratory infection, unspecified: Secondary | ICD-10-CM | POA: Insufficient documentation

## 2015-08-25 DIAGNOSIS — Z79899 Other long term (current) drug therapy: Secondary | ICD-10-CM | POA: Diagnosis not present

## 2015-08-25 DIAGNOSIS — R1110 Vomiting, unspecified: Secondary | ICD-10-CM | POA: Insufficient documentation

## 2015-08-25 DIAGNOSIS — Z720 Tobacco use: Secondary | ICD-10-CM | POA: Diagnosis not present

## 2015-08-25 DIAGNOSIS — Z862 Personal history of diseases of the blood and blood-forming organs and certain disorders involving the immune mechanism: Secondary | ICD-10-CM | POA: Diagnosis not present

## 2015-08-25 DIAGNOSIS — J029 Acute pharyngitis, unspecified: Secondary | ICD-10-CM | POA: Diagnosis present

## 2015-08-25 DIAGNOSIS — Z8619 Personal history of other infectious and parasitic diseases: Secondary | ICD-10-CM | POA: Diagnosis not present

## 2015-08-25 DIAGNOSIS — J45909 Unspecified asthma, uncomplicated: Secondary | ICD-10-CM | POA: Diagnosis not present

## 2015-08-25 NOTE — ED Provider Notes (Signed)
CSN: [id number]     Arrival date & time 08/25/15  2226 History  This chart was scribed for non-physician practitioner,working with Loren Racer, MD, by Budd Palmer ED Scribe. This patient was seen in room WTR9/WTR9 and the patient's care was started at 11:22 PM    Chief Complaint  Patient presents with  . Sore Throat  . Nasal Congestion   The history is provided by the patient. No language interpreter was used.   HPI Comments: [name] is a 24 y.o. female smoker at 0.5 ppd who presents to the Emergency Department complaining of sore throat and nasal congestion onset 2 days ago. She reports associated post-tussive vomiting, productive cough (yellowish green), and trouble swallowing. Pt denies a PMHx of strep or asthma. She denies fever.  Past Medical History  Diagnosis Date  . No pertinent past medical history   . Asthma   . Anemia   . History of chlamydia    Past Surgical History  Procedure Laterality Date  . No past surgeries     Family History  Problem Relation Age of Onset  . Anesthesia problems Mother   . Arthritis Mother   . Asthma Brother   . Seizures Maternal Aunt   . Anesthesia problems Maternal Aunt   . Hypertension Paternal Aunt   . Cancer Paternal Aunt     lung  . Hypertension Paternal Uncle   . Cancer Maternal Grandmother     cervix  . Seizures Maternal Grandmother   . Hypertension Maternal Grandfather   . Cancer Maternal Grandfather     prostate  . Hypertension Paternal Grandfather   . Mental retardation Cousin     cp x2   Social History  Substance Use Topics  . Smoking status: Current Every Day Smoker -- 0.50 packs/day for 2 years    Types: Cigarettes  . Smokeless tobacco: Never Used  . Alcohol Use: No   OB History    Gravida Para Term Preterm AB TAB SAB Ectopic Multiple Living   1 1 1       1      Review of Systems  Constitutional: Negative for fever.  HENT: Positive for congestion, sore throat and trouble swallowing.    Respiratory: Positive for cough.   Gastrointestinal: Positive for vomiting (post-tussive).    Allergies  Review of patient's allergies indicates no known allergies.  Home Medications   Prior to Admission medications   Medication Sig Start Date End Date Taking? Authorizing Provider  acetaminophen (TYLENOL) 325 MG tablet Take 650 mg by mouth every 6 (six) hours as needed for pain.    Historical Provider, MD  fexofenadine (ALLEGRA) 30 MG tablet Take 30 mg by mouth 2 (two) times daily.    Historical Provider, MD  HYDROcodone-acetaminophen (NORCO/VICODIN) 5-325 MG per tablet Take 2 tablets by mouth every 4 (four) hours as needed for pain. 06/11/13   Elson Areas, PA-C  ipratropium (ATROVENT) 0.06 % nasal spray Place 2 sprays into both nostrils 4 (four) times daily. 07/23/14   Linna Hoff, MD  ketorolac (ACULAR) 0.5 % ophthalmic solution Place 1 drop into both eyes 4 (four) times daily. 07/23/14   Linna Hoff, MD  loratadine (CLARITIN) 10 MG tablet Take 10 mg by mouth daily.    Historical Provider, MD  medroxyPROGESTERone (DEPO-PROVERA) 150 MG/ML injection Inject 150 mg into the muscle every 3 (three) months. Patient states that her last shot was 2 weeks ago    Historical Provider, MD  metroNIDAZOLE (FLAGYL) 500  MG tablet Take 1 tablet (500 mg total) by mouth 2 (two) times daily. 03/22/13   Armando Reichert, CNM  tobramycin (TOBREX) 0.3 % ophthalmic solution Place 1 drop into the left eye every 4 (four) hours. 06/11/13   Elson Areas, PA-C   BP 119/78 mmHg  Pulse 79  Temp(Src) 98.5 F (36.9 C) (Oral)  Resp 18  SpO2 98%  LMP 08/25/2015 Physical Exam  Constitutional: She appears well-developed and well-nourished.  Very well-appearing  HENT:  Head: Normocephalic and atraumatic.  Oropharynx is erythematous without exudates, TMs clear bilat. No nasal mucosal swelling.  Eyes: Conjunctivae are normal. Right eye exhibits no discharge. Left eye exhibits no discharge.  Pulmonary/Chest: Effort  normal and breath sounds normal. No respiratory distress. She has no wheezes. She has no rales.  Lymphadenopathy:    She has cervical adenopathy.  Neurological: She is alert. Coordination normal.  Skin: Skin is warm and dry. No rash noted. She is not diaphoretic. No erythema.  Psychiatric: She has a normal mood and affect.  Nursing note and vitals reviewed.   ED Course  Procedures  DIAGNOSTIC STUDIES: Oxygen Saturation is 98% on RA, normal by my interpretation.    COORDINATION OF CARE: 11:25 PM - Discussed plans to wait on diagnostic studies. Pt advised of plan for treatment and pt agrees.  Labs Review Labs Reviewed  RAPID STREP SCREEN (NOT AT [facility])   Imaging Review No results found. I have personally reviewed and evaluated these images and lab results as part of my medical decision-making.   EKG Interpretation None      MDM   Final diagnoses:  None    1. URI 2. Pharyngitis  Uncomplicated URI/sore throat requiring supportive care.  I personally performed the services described in this documentation, which was scribed in my presence. The recorded information has been reviewed and is accurate.     Elpidio Anis, PA-C 08/26/15 0013  Loren Racer, MD 08/26/15 [phone]

## 2015-08-25 NOTE — ED Notes (Signed)
Pt complains of nasal congestion and sore throat for the past 2 days. Pt states she tried taking benadryl and cold medicine, which she states has not helped. Pt states it is painful to swallow.

## 2015-08-26 LAB — RAPID STREP SCREEN (MED CTR MEBANE ONLY): STREPTOCOCCUS, GROUP A SCREEN (DIRECT): NEGATIVE

## 2015-08-26 NOTE — Discharge Instructions (Signed)
Salt Water Gargle This solution will help make your mouth and throat feel better. HOME CARE INSTRUCTIONS   Mix 1 teaspoon of salt in 8 ounces of warm water.  Gargle with this solution as much or often as you need or as directed. Swish and gargle gently if you have any sores or wounds in your mouth.  Do not swallow this mixture. Document Released: 09/07/2004 Document Revised: 02/26/2012 Document Reviewed: 01/29/2009 [facility] Patient Information 2015 [place], [place]. This information is not intended to replace advice given to you by your health care provider. Make sure you discuss any questions you have with your health care provider. Pharyngitis Pharyngitis is redness, pain, and swelling (inflammation) of your pharynx.  CAUSES  Pharyngitis is usually caused by infection. Most of the time, these infections are from viruses (viral) and are part of a cold. However, sometimes pharyngitis is caused by bacteria (bacterial). Pharyngitis can also be caused by allergies. Viral pharyngitis may be spread from person to person by coughing, sneezing, and personal items or utensils (cups, forks, spoons, toothbrushes). Bacterial pharyngitis may be spread from person to person by more intimate contact, such as kissing.  SIGNS AND SYMPTOMS  Symptoms of pharyngitis include:   Sore throat.   Tiredness (fatigue).   Low-grade fever.   Headache.  Joint pain and muscle aches.  Skin rashes.  Swollen lymph nodes.  Plaque-like film on throat or tonsils (often seen with bacterial pharyngitis). DIAGNOSIS  Your health care provider will ask you questions about your illness and your symptoms. Your medical history, along with a physical exam, is often all that is needed to diagnose pharyngitis. Sometimes, a rapid strep test is done. Other lab tests may also be done, depending on the suspected cause.  TREATMENT  Viral pharyngitis will usually get better in 3-4 days without the use of medicine. Bacterial  pharyngitis is treated with medicines that kill germs (antibiotics).  HOME CARE INSTRUCTIONS   Drink enough water and fluids to keep your urine clear or pale yellow.   Only take over-the-counter or prescription medicines as directed by your health care provider:   If you are prescribed antibiotics, make sure you finish them even if you start to feel better.   Do not take aspirin.   Get lots of rest.   Gargle with 8 oz of salt water ( tsp of salt per 1 qt of water) as often as every 1-2 hours to soothe your throat.   Throat lozenges (if you are not at risk for choking) or sprays may be used to soothe your throat. SEEK MEDICAL CARE IF:   You have large, tender lumps in your neck.  You have a rash.  You cough up green, yellow-brown, or bloody spit. SEEK IMMEDIATE MEDICAL CARE IF:   Your neck becomes stiff.  You drool or are unable to swallow liquids.  You vomit or are unable to keep medicines or liquids down.  You have severe pain that does not go away with the use of recommended medicines.  You have trouble breathing (not caused by a stuffy nose). MAKE SURE YOU:   Understand these instructions.  Will watch your condition.  Will get help right away if you are not doing well or get worse. Document Released: 12/04/2005 Document Revised: 09/24/2013 Document Reviewed: 08/11/2013 [facility] Patient Information 2015 [place], [place]. This information is not intended to replace advice given to you by your health care provider. Make sure you discuss any questions you have with your health care provider. Upper  Respiratory Infection, Adult An upper respiratory infection (URI) is also sometimes known as the common cold. The upper respiratory tract includes the nose, sinuses, throat, trachea, and bronchi. Bronchi are the airways leading to the lungs. Most people improve within 1 week, but symptoms can last up to 2 weeks. A residual cough may last even longer.  CAUSES Many different  viruses can infect the tissues lining the upper respiratory tract. The tissues become irritated and inflamed and often become very moist. Mucus production is also common. A cold is contagious. You can easily spread the virus to others by oral contact. This includes kissing, sharing a glass, coughing, or sneezing. Touching your mouth or nose and then touching a surface, which is then touched by another person, can also spread the virus. SYMPTOMS  Symptoms typically develop 1 to 3 days after you come in contact with a cold virus. Symptoms vary from person to person. They may include:  Runny nose.  Sneezing.  Nasal congestion.  Sinus irritation.  Sore throat.  Loss of voice (laryngitis).  Cough.  Fatigue.  Muscle aches.  Loss of appetite.  Headache.  Low-grade fever. DIAGNOSIS  You might diagnose your own cold based on familiar symptoms, since most people get a cold 2 to 3 times a year. Your caregiver can confirm this based on your exam. Most importantly, your caregiver can check that your symptoms are not due to another disease such as strep throat, sinusitis, pneumonia, asthma, or epiglottitis. Blood tests, throat tests, and X-rays are not necessary to diagnose a common cold, but they may sometimes be helpful in excluding other more serious diseases. Your caregiver will decide if any further tests are required. RISKS AND COMPLICATIONS  You may be at risk for a more severe case of the common cold if you smoke cigarettes, have chronic heart disease (such as heart failure) or lung disease (such as asthma), or if you have a weakened immune system. The very young and very old are also at risk for more serious infections. Bacterial sinusitis, middle ear infections, and bacterial pneumonia can complicate the common cold. The common cold can worsen asthma and chronic obstructive pulmonary disease (COPD). Sometimes, these complications can require emergency medical care and may be  life-threatening. PREVENTION  The best way to protect against getting a cold is to practice good hygiene. Avoid oral or hand contact with people with cold symptoms. Wash your hands often if contact occurs. There is no clear evidence that vitamin C, vitamin E, echinacea, or exercise reduces the chance of developing a cold. However, it is always recommended to get plenty of rest and practice good nutrition. TREATMENT  Treatment is directed at relieving symptoms. There is no cure. Antibiotics are not effective, because the infection is caused by a virus, not by bacteria. Treatment may include:  Increased fluid intake. Sports drinks offer valuable electrolytes, sugars, and fluids.  Breathing heated mist or steam (vaporizer or shower).  Eating chicken soup or other clear broths, and maintaining good nutrition.  Getting plenty of rest.  Using gargles or lozenges for comfort.  Controlling fevers with ibuprofen or acetaminophen as directed by your caregiver.  Increasing usage of your inhaler if you have asthma. Zinc gel and zinc lozenges, taken in the first 24 hours of the common cold, can shorten the duration and lessen the severity of symptoms. Pain medicines may help with fever, muscle aches, and throat pain. A variety of non-prescription medicines are available to treat congestion and runny nose. Your caregiver  can make recommendations and may suggest nasal or lung inhalers for other symptoms.  HOME CARE INSTRUCTIONS   Only take over-the-counter or prescription medicines for pain, discomfort, or fever as directed by your caregiver.  Use a warm mist humidifier or inhale steam from a shower to increase air moisture. This may keep secretions moist and make it easier to breathe.  Drink enough water and fluids to keep your urine clear or pale yellow.  Rest as needed.  Return to work when your temperature has returned to normal or as your caregiver advises. You may need to stay home longer to  avoid infecting others. You can also use a face mask and careful hand washing to prevent spread of the virus. SEEK MEDICAL CARE IF:   After the first few days, you feel you are getting worse rather than better.  You need your caregiver's advice about medicines to control symptoms.  You develop chills, worsening shortness of breath, or brown or red sputum. These may be signs of pneumonia.  You develop yellow or brown nasal discharge or pain in the face, especially when you bend forward. These may be signs of sinusitis.  You develop a fever, swollen neck glands, pain with swallowing, or white areas in the back of your throat. These may be signs of strep throat. SEEK IMMEDIATE MEDICAL CARE IF:   You have a fever.  You develop severe or persistent headache, ear pain, sinus pain, or chest pain.  You develop wheezing, a prolonged cough, cough up blood, or have a change in your usual mucus (if you have chronic lung disease).  You develop sore muscles or a stiff neck. Document Released: 05/30/2001 Document Revised: 02/26/2012 Document Reviewed: 03/11/2014 [facility] Patient Information 2015 [place], [place]. This information is not intended to replace advice given to you by your health care provider. Make sure you discuss any questions you have with your health care provider.

## 2015-08-28 LAB — CULTURE, GROUP A STREP: STREP A CULTURE: NEGATIVE

## 2015-10-25 ENCOUNTER — Emergency Department (HOSPITAL_COMMUNITY)
Admission: EM | Admit: 2015-10-25 | Discharge: 2015-10-25 | Disposition: A | Discharge status: Home / Self Care | Payer: BLUE CROSS/BLUE SHIELD | Attending: Emergency Medicine | Admitting: Emergency Medicine

## 2015-10-25 ENCOUNTER — Encounter (HOSPITAL_COMMUNITY): Payer: Self-pay | Admitting: *Deleted

## 2015-10-25 DIAGNOSIS — M545 Low back pain: Secondary | ICD-10-CM | POA: Diagnosis present

## 2015-10-25 DIAGNOSIS — M5416 Radiculopathy, lumbar region: Secondary | ICD-10-CM | POA: Diagnosis not present

## 2015-10-25 DIAGNOSIS — Z8619 Personal history of other infectious and parasitic diseases: Secondary | ICD-10-CM | POA: Insufficient documentation

## 2015-10-25 DIAGNOSIS — Z79899 Other long term (current) drug therapy: Secondary | ICD-10-CM | POA: Diagnosis not present

## 2015-10-25 DIAGNOSIS — Z862 Personal history of diseases of the blood and blood-forming organs and certain disorders involving the immune mechanism: Secondary | ICD-10-CM | POA: Insufficient documentation

## 2015-10-25 DIAGNOSIS — J45909 Unspecified asthma, uncomplicated: Secondary | ICD-10-CM | POA: Insufficient documentation

## 2015-10-25 DIAGNOSIS — Z3202 Encounter for pregnancy test, result negative: Secondary | ICD-10-CM | POA: Insufficient documentation

## 2015-10-25 DIAGNOSIS — Z720 Tobacco use: Secondary | ICD-10-CM | POA: Insufficient documentation

## 2015-10-25 LAB — POC URINE PREG, ED: Preg Test, Ur: NEGATIVE

## 2015-10-25 MED ORDER — HYDROCODONE-ACETAMINOPHEN 5-325 MG PO TABS: ORAL_TABLET | ORAL | Status: DC

## 2015-10-25 MED ORDER — MORPHINE SULFATE (PF) 4 MG/ML IV SOLN
4.0000 mg | Freq: Once | INTRAVENOUS | Status: AC
  Administered 2015-10-25: 4 mg via INTRAMUSCULAR
  Filled 2015-10-25: qty 1

## 2015-10-25 MED ORDER — METHOCARBAMOL 500 MG PO TABS
750.0000 mg | ORAL_TABLET | Freq: Once | ORAL | Status: AC
Start: 2015-10-25 — End: 2015-10-25
  Administered 2015-10-25: 750 mg via ORAL
  Filled 2015-10-25: qty 2

## 2015-10-25 MED ORDER — METHOCARBAMOL 500 MG PO TABS: 1000.0000 mg | ORAL_TABLET | Freq: Four times a day (QID) | ORAL | Status: DC | PRN

## 2015-10-25 MED ORDER — PREDNISONE 20 MG PO TABS: 40.0000 mg | ORAL_TABLET | Freq: Every day | ORAL | Status: DC

## 2015-10-25 MED ORDER — IBUPROFEN 400 MG PO TABS
400.0000 mg | ORAL_TABLET | Freq: Once | ORAL | Status: AC
Start: 2015-10-25 — End: 2015-10-25
  Administered 2015-10-25: 400 mg via ORAL
  Filled 2015-10-25: qty 1

## 2015-10-25 NOTE — ED Provider Notes (Signed)
CSN: [id number]     Arrival date & time 10/25/15  1219 History  By signing my name below, I, [name], attest that this documentation has been prepared under the direction and in the presence of [employer], PA-C. Electronically Signed: Ronney Quinn, ED Scribe. 10/25/2015. 2:27 PM.    Chief Complaint  Patient presents with  . Back Pain   The history is provided by the patient. No language interpreter was used.    HPI Comments: [name] is a 24 y.o. female who presents to the Emergency Department complaining of atraumatic, stabbing, right lower back pain radiating down her posterior legs. This is a new problem. She denies any recent falls, injuries, heavy lifting, or any other changes in activity. Patient also notes she had abdominal cramping and vomiting last week that since resolved. She is able to ambulate without difficulty. However, movement exacerbates her pain. Nothing makes it better. Patient had taken a Tylenol with no relief. Patient reports a history of asthma and anemia, although she states she is unsure why she is anemic. Patient states she does not currently have a PCP. She denies burning with urination, dark urine, or any other urinary symptoms. She states she has a contraceptive implant and has not had menses since having the implant. She has NKDA. Patient states her family is picking her up today. Denies fever, chills, change in bowel or bladder habits, h/o IDVU or cancer, numbness or weakness.   Past Medical History  Diagnosis Date  . No pertinent past medical history   . Asthma   . Anemia   . History of chlamydia    Past Surgical History  Procedure Laterality Date  . No past surgeries     Family History  Problem Relation Age of Onset  . Anesthesia problems Mother   . Arthritis Mother   . Asthma Brother   . Seizures Maternal Aunt   . Anesthesia problems Maternal Aunt   . Hypertension Paternal Aunt   . Cancer Paternal Aunt     lung  . Hypertension Paternal  Uncle   . Cancer Maternal Grandmother     cervix  . Seizures Maternal Grandmother   . Hypertension Maternal Grandfather   . Cancer Maternal Grandfather     prostate  . Hypertension Paternal Grandfather   . Mental retardation Cousin     cp x2   Social History  Substance Use Topics  . Smoking status: Current Every Day Smoker -- 0.50 packs/day for 2 years    Types: Cigarettes  . Smokeless tobacco: Never Used  . Alcohol Use: No   OB History    Gravida Para Term Preterm AB TAB SAB Ectopic Multiple Living   Review of Systems A complete 10 system review of systems was obtained and all systems are negative except as noted in the HPI and PMH.    Allergies  Review of patient's allergies indicates no known allergies.  Home Medications   Prior to Admission medications   Medication Sig Start Date End Date Taking? Authorizing Provider  acetaminophen (TYLENOL) 325 MG tablet Take 650 mg by mouth every 6 (six) hours as needed for pain.    Historical Provider, MD  fexofenadine (ALLEGRA) 30 MG tablet Take 30 mg by mouth 2 (two) times daily.    Historical Provider, MD  HYDROcodone-acetaminophen (NORCO/VICODIN) 5-325 MG per tablet Take 2 tablets by mouth every 4 (four) hours as needed for  pain. 06/11/13   Elson AreasLeslie K Sofia, PA-C  ipratropium (ATROVENT) 0.06 % nasal spray Place 2 sprays into both nostrils 4 (four) times daily. 07/23/14   Linna HoffJames D Kindl, MD  ketorolac (ACULAR) 0.5 % ophthalmic solution Place 1 drop into both eyes 4 (four) times daily. 07/23/14   Linna HoffJames D Kindl, MD  loratadine (CLARITIN) 10 MG tablet Take 10 mg by mouth daily.    Historical Provider, MD  medroxyPROGESTERone (DEPO-PROVERA) 150 MG/ML injection Inject 150 mg into the muscle every 3 (three) months. Patient states that her last shot was 2 weeks ago    Historical Provider, MD  metroNIDAZOLE (FLAGYL) 500 MG tablet Take 1 tablet (500 mg total) by mouth 2 (two) times daily. 03/22/13   Armando ReichertHeather D Hogan, CNM  tobramycin  (TOBREX) 0.3 % ophthalmic solution Place 1 drop into the left eye every 4 (four) hours. 06/11/13   Elson AreasLeslie K Sofia, PA-C   BP 132/76 mmHg  Pulse 86  Temp(Src) 98 F (36.7 C) (Oral)  Resp 20  SpO2 98% Physical Exam  Constitutional: She is oriented to person, place, and time. She appears well-developed and well-nourished.  Tearful, patient is hesitant to use because it induces pain  HENT:  Head: Normocephalic and atraumatic.  Eyes: Conjunctivae and EOM are normal.  Neck: Normal range of motion. Neck supple. No tracheal deviation present.  Cardiovascular: Normal rate, regular rhythm and intact distal pulses.   Pulmonary/Chest: Effort normal. No respiratory distress.  Abdominal: Soft. There is no tenderness.  Musculoskeletal: Normal range of motion.       Back:       Arms: Neurological: She is alert and oriented to person, place, and time.  No point tenderness to percussion of lumbar spinal processes.  No TTP or paraspinal muscular spasm. Strength is 5 out of 5 to bilateral lower extremities at hip and knee. Ankle strength 5 out of 5, no clonus, neurovascularly intact. Patellar reflexes are 2+ bilaterally.    Straight leg raise is positive on the right side at 30, positive on the left side at 45.   Skin: Skin is warm and dry.  Psychiatric: She has a normal mood and affect. Her behavior is normal.  Nursing note and vitals reviewed.   ED Course  Procedures (including critical care time)  DIAGNOSTIC STUDIES: Oxygen Saturation is 98% on RA, normal by my interpretation.    COORDINATION OF CARE: 1:43 PM - Suspect sciatica. Discussed treatment plan with pt at bedside which includes UA to r/o pregnancy. Will give pain medication and school note. Pt cautioned about sedating effects of medication. Advised pt to stay active but avoid heavy lifting. Pt verbalized understanding and agreed to plan.   Labs Review Labs Reviewed  POC URINE PREG, ED    MDM   Final diagnoses:  Lumbar  radicular pain    Filed Vitals:   10/25/15 1253  BP: 132/76  Pulse: 86  Temp: 98 F (36.7 C)  TempSrc: Oral  Resp: 20  SpO2: 98%    Medications  morphine 4 MG/ML injection 4 mg (not administered)  methocarbamol (ROBAXIN) tablet 750 mg (not administered)  ibuprofen (ADVIL,MOTRIN) tablet 400 mg (not administered)    Cain SaupeJasmine R Colello is 24 y.o. female presenting with severe right-sided sciatica.  No neurological deficits and normal neuro exam.  Patient can walk but states is painful.  No loss of bowel or bladder control.  No concern for cauda equina.  No fever, night sweats, weight loss, h/o cancer, IVDU.  RICE protocol  and pain medicine indicated and discussed with patient.  Evaluation does not show pathology that would require ongoing emergent intervention or inpatient treatment. Pt is hemodynamically stable and mentating appropriately. Discussed findings and plan with patient/guardian, who agrees with care plan. All questions answered. Return precautions discussed and outpatient follow up given.   Discharge Medication List as of 10/25/2015  2:12 PM    START taking these medications   Details  methocarbamol (ROBAXIN) 500 MG tablet Take 2 tablets (1,000 mg total) by mouth 4 (four) times daily as needed (Pain)., Starting 10/25/2015, Until Discontinued, Print    predniSONE (DELTASONE) 20 MG tablet Take 2 tablets (40 mg total) by mouth daily., Starting 10/25/2015, Until Discontinued, Print         I personally performed the services described in this documentation, which was scribed in my presence. The recorded information has been reviewed and is accurate.    Wynetta Emery, PA-C 10/25/15 1513  Benjiman Core, MD 10/25/15 [phone]

## 2015-10-25 NOTE — Discharge Instructions (Signed)
Please take ibuprofen 400mg  (this is normally 2 over the counter pills) every 6 hours (take with food to minimze stomach irritation).   Take robaxin and/or Vicodin for breakthrough pain, do not drink alcohol, drive, care for children or perfom other critical tasks while taking robaxin and/or Vicodin .  Please follow with your primary care doctor in the next 2 days for a check-up. They must obtain records for further management.   Do not hesitate to return to the Emergency Department for any new, worsening or concerning symptoms.   Do not hesitate to return to the emergency room for any new, worsening or concerning symptoms.  Please obtain primary care using resource guide below. Let them know that you were seen in the emergency room and that they will need to obtain records for further outpatient management.    Emergency Department Resource Guide 1) Find a Doctor and Pay Out of Pocket Although you won't have to find out who is covered by your insurance plan, it is a good idea to ask around and get recommendations. You will then need to call the office and see if the doctor you have chosen will accept you as a new patient and what types of options they offer for patients who are self-pay. Some doctors offer discounts or will set up payment plans for their patients who do not have insurance, but you will need to ask so you aren't surprised when you get to your appointment.  2) Contact Your Local Health Department Not all health departments have doctors that can see patients for sick visits, but many do, so it is worth a call to see if yours does. If you don't know where your local health department is, you can check in your phone book. The CDC also has a tool to help you locate your state's health department, and many state websites also have listings of all of their local health departments.  3) Find a Walk-in Clinic If your illness is not likely to be very severe or complicated, you may want to  try a walk in clinic. These are popping up all over the country in pharmacies, drugstores, and shopping centers. They're usually staffed by nurse practitioners or physician assistants that have been trained to treat common illnesses and complaints. They're usually fairly quick and inexpensive. However, if you have serious medical issues or chronic medical problems, these are probably not your best option.  No Primary Care Doctor: - Call Health Connect at  [phone] - they can help you locate a primary care doctor that  accepts your insurance, provides certain services, etc. - Physician Referral Service- [phone]  Chronic Pain Problems: Organization         Address  Phone   Notes  [facility]  [phone] Patients need to be referred by their primary care doctor.   Medication Assistance: Organization         Address  Phone   Notes  [facility] Medication [facility] [address] [place], [place] [address] [phone] --Must be a resident of [facility] -- Must have NO insurance coverage whatsoever (no Medicaid/ Medicare, etc.) -- The pt. MUST have a primary care doctor that directs their care regularly and follows them in the community   MedAssist  [phone]   [employer]  [phone]    Agencies that provide inexpensive medical care: Organization         Address  Phone   Notes  [facility]  [phone]   [facility]    [phone]   [facility] [address] [place], [place] [address] [phone]   [facility] [address] [place], [place] [phone]   Planned Parenthood    [phone]   [facility]    [phone]   Community [facility] and [facility]  [address], [place] Phone:  [phone], Fax:  [phone] Hours of Operation:  9 am - 6 pm, M-F.  Also accepts Medicaid/Medicare and self-pay.  [facility]  [address], [place] Phone: [phone], Fax: [phone]. Hours of Operation:  8:30 am - 5:30 pm, M-F.  Also accepts Medicaid and self-pay.  [facility] [place] [address], [place] Phone: [phone]   [facility] [address] Natasha Bence [place], [place] [phone], Ext. [phone] Mondays & Thursdays: 7-9 AM.  First 15 patients are seen on a first come, first serve basis.    Medicaid-accepting [place] Providers:  Organization         Address  Phone   Notes  [facility] [address], [place] [phone] Also accepts self-pay patients.  [facility] [address] Laurell Josephs [place], [place]  [phone]   [facility] [address], [place] [phone]   [facility] [address] [place] [phone]   Renaye Rakers [address], [place]   [phone] Only accepts [place] patients after they have their name applied to their card.   Self-Pay (no insurance) in [facility]:  Organization         Address  Phone   Notes  Sickle Cell Patients, [facility] Internal Medicine [address] [place], [place] [phone]   [facility] Urgent Care [address] [place], [place] [phone]   [facility] [place]  [address], [place] [phone]   [facility]/Dr. Osei-Bonsu  [address] [place] or [address], [place] [phone] Phone number for both [place] and [place] locations is the same.  Urgent Medical and [facility] [address] [place] [phone]   [facility] [address], [place] or [address] [phone]   [facility] [address] [place] [phone], phone; [phone], fax  Sees patients 1st and 3rd Saturday of every month.  Must not qualify for public or private insurance (i.e. Medicaid, Medicare, [place] Health Choice, Veterans' Benefits)  Household income should be no more than 200% of the poverty level The clinic cannot treat you if you are pregnant or think you are pregnant  Sexually transmitted diseases are not treated at the clinic.    Dental Care: Organization         Address  Phone  Notes  [facility] Department of [facility] [address] [place], [place] [phone] Accepts children up to age 85 who are enrolled in [place] or [place] Health Choice; pregnant women with a Medicaid card; and children who have applied for Medicaid or [place] Health Choice, but were declined, whose parents can pay a reduced fee at time of service.  [facility] Department of  [facility] [place]  [address], [place] [phone] Accepts children up to age 24 who are enrolled in Medicaid or [place] Health Choice; pregnant women with a Medicaid card; and children who have applied for Medicaid or  Health Choice, but were declined, whose parents can pay a reduced fee at time of service.  [facility]  [address], [place] [phone] Patients are seen by appointment only. Walk-ins are not accepted. [facility] will see patients 24 years of age and older. Monday - Tuesday (8am-5pm) Most Wednesdays (8:30-5pm) $30 per visit, cash only  [facility]  [address], [facility] [phone] Patients are seen by appointment only. Walk-ins are not accepted. [facility] will see patients 24 years of age and older. One Wednesday Evening (Monthly: Volunteer Based).  $30 per visit, cash only  [employer] [facility]  [phone] for adults; Children under age 34, call Graduate Pediatric Dentistry at [phone]) [phone]. Children aged 364-14, please call [phone] to  request a pediatric application.  Dental services are provided in all areas of dental care including fillings, crowns and bridges, complete and partial dentures, implants, gum treatment, root canals, and extractions. Preventive care is also provided. Treatment is provided to both adults and children. Patients are selected via a lottery and there is often a waiting list.   [facility] [address], [place]  [phone] [email or web address]   [facility] [address], [place], [place] [phone], Ext. [phone] Second and Fourth Thursday of each month, opens at 6:30 AM; Clinic ends at 9 AM.  Patients are seen on a first-come first-served basis, and a limited number are seen during each clinic.   [facility]  [address], [place], [place] [phone]   Eligibility Requirements You must have lived in [place], [place], or [place] counties for at least the last three months.   You cannot be eligible for state or federal sponsored [place] Cityhealthcare insurance, including CIGNAVeterans Administration, [place]icaid, or [employer] EntertainmentMedicare.   You generally cannot be eligible for healthcare insurance through your employer.    How to apply: Eligibility screenings are held every Tuesday and Wednesday afternoon from 1:00 pm until 4:00 pm. You do not need an appointment for the interview!  [facility] [address], [place], [place] [phone]   [facility]  [phone]   [facility]  [phone]   [facility]  [phone]    Behavioral Health Resources in the Community: Intensive Outpatient Programs Organization         Address  Phone  Notes  [facility] [address], [place], [place] [phone]   [facility] Outpatient [address], [place], [place] [phone]   ADS: [facility] Drug S[facility] [address] [place] [address], [place], [place]  [phone]   [facility] [address],  [place], [place] [phone] or [phone]   Substance Abuse Resources Organization         Address  Phone  Notes  [facility] Drug Services  [phone]   [facility]  [phone]   [facility]  [phone]   [facility]oyd[facility] Flock[facility]  [phone]   Residential & Outpatient Substance Abuse Program  [phone]   Psychological Services Organization         Address  Phone  Notes  [facility]  [phone]   [employer]   [facility] [address] [place] [phone] or [phone]    Mobile Crisis Teams Organization         Address  Phone  Notes  [facility], Mobile Crisis Care Unit  [phone]   [facility]  [address] [place], [place] [phone]   Doristine Locks [address] [place] [phone]    Self-Help/Support Groups Organization         Address  Phone             Notes  [facility] of [place] - variety of support groups  336- I7437963 Call for more information  Narcotics Anonymous (NA), Caring [facility] [address], [employer] [address]  2 meetings at this location   Statistician         Address  Phone  Notes  [facility] [address],    [place]  [phone]   [facility]  [address] [place] [address]6433, [place], [place] [phone]   [facility] Treatment Facility [address] [place], [place] [phone] Admissions: 8am-3pm M-F  [facility] [address]    [place], [place] [phone]   [facility] [address] [place], [place], [place] [phone]   [facility] [address],  [place], [place] [phone]   [facility] - Intensive Outpatient [address] Laurell [address], [place], [place] [phone]   [place] ([facility] [address] [place]  [place], [place] [phone] or [phone]   [facility] Services (RTS) [address] [place], [place] [phone] Accepts Medicaid  [facility] [place] [address],  [place] [phone] Substance Abuse/Addiction Treatment   [facility] Organization         Address  Phone  Notes  [facility]  [phone]   Angie Fava, PhD [address] Ervin Knack [place], [place]   [phone] or [phone]   [facility]   [address] [place], [place] [phone]   [facility] [address], [place], [place] [phone] Insurance/Medicaid/sponsorship through [facility] [address]                                    [place], [place] [phone] Therapy/tele-psych/case  [facility] [address], [place] [phone]    Dr. Lolly Mustache  [phone]   [facility]  [facility] Dept. 1) [address], [place] 2) [address], [place] 3)  [address], [place] [phone]  [phone]   [facility] Child Abuse Hotline (914)127-4641 or 9078308554 (After Hours)

## 2015-10-25 NOTE — ED Notes (Signed)
Pt is here with right lower back pain that started yesterday.

## 2015-10-25 NOTE — ED Notes (Signed)
Declined W/C at D/C and was escorted to lobby by RN. 

## 2015-10-26 ENCOUNTER — Encounter (HOSPITAL_BASED_OUTPATIENT_CLINIC_OR_DEPARTMENT_OTHER): Payer: Self-pay | Admitting: Emergency Medicine

## 2016-04-28 ENCOUNTER — Emergency Department (HOSPITAL_COMMUNITY)
Admission: EM | Admit: 2016-04-28 | Discharge: 2016-04-28 | Disposition: A | Discharge status: Home / Self Care | Payer: BLUE CROSS/BLUE SHIELD | Attending: Emergency Medicine | Admitting: Emergency Medicine

## 2016-04-28 ENCOUNTER — Encounter (HOSPITAL_COMMUNITY): Payer: Self-pay

## 2016-04-28 DIAGNOSIS — Z79899 Other long term (current) drug therapy: Secondary | ICD-10-CM | POA: Insufficient documentation

## 2016-04-28 DIAGNOSIS — R079 Chest pain, unspecified: Secondary | ICD-10-CM | POA: Diagnosis not present

## 2016-04-28 DIAGNOSIS — Z792 Long term (current) use of antibiotics: Secondary | ICD-10-CM | POA: Insufficient documentation

## 2016-04-28 DIAGNOSIS — F17210 Nicotine dependence, cigarettes, uncomplicated: Secondary | ICD-10-CM | POA: Insufficient documentation

## 2016-04-28 DIAGNOSIS — Z862 Personal history of diseases of the blood and blood-forming organs and certain disorders involving the immune mechanism: Secondary | ICD-10-CM | POA: Insufficient documentation

## 2016-04-28 DIAGNOSIS — J45909 Unspecified asthma, uncomplicated: Secondary | ICD-10-CM | POA: Diagnosis not present

## 2016-04-28 DIAGNOSIS — Z7952 Long term (current) use of systemic steroids: Secondary | ICD-10-CM | POA: Insufficient documentation

## 2016-04-28 DIAGNOSIS — J111 Influenza due to unidentified influenza virus with other respiratory manifestations: Secondary | ICD-10-CM | POA: Insufficient documentation

## 2016-04-28 DIAGNOSIS — Z8619 Personal history of other infectious and parasitic diseases: Secondary | ICD-10-CM | POA: Diagnosis not present

## 2016-04-28 DIAGNOSIS — R05 Cough: Secondary | ICD-10-CM | POA: Diagnosis present

## 2016-04-28 MED ORDER — ONDANSETRON 4 MG PO TBDP
4.0000 mg | ORAL_TABLET | Freq: Once | ORAL | Status: AC
Start: 2016-04-28 — End: 2016-04-28
  Administered 2016-04-28: 4 mg via ORAL
  Filled 2016-04-28: qty 1

## 2016-04-28 MED ORDER — BENZONATATE 100 MG PO CAPS: 100.0000 mg | ORAL_CAPSULE | Freq: Three times a day (TID) | ORAL | Status: DC

## 2016-04-28 MED ORDER — BENZONATATE 100 MG PO CAPS
100.0000 mg | ORAL_CAPSULE | Freq: Once | ORAL | Status: AC
  Administered 2016-04-28: 100 mg via ORAL
  Filled 2016-04-28: qty 1

## 2016-04-28 MED ORDER — ONDANSETRON 4 MG PO TBDP: 4.0000 mg | ORAL_TABLET | Freq: Three times a day (TID) | ORAL | Status: DC | PRN

## 2016-04-28 NOTE — ED Notes (Signed)
Pt complaining of fever x 3 days, up to 101.5. Pt complaining of generalized body aches and non-productive cough. Pt complaining of left sided lymph node swelling.

## 2016-04-28 NOTE — Discharge Instructions (Signed)
You have been seen today for a cough, fever, and body aches. Your symptoms are consistent with a viral illness. Viruses do not require antibiotics. Treatment is symptomatic care. Drink plenty of fluids and get plenty of rest. You should be drinking at least a liter of water an hour to stay hydrated. Ibuprofen or Tylenol for pain or fever. Zofran for nausea. Tessalon for cough. Plain Mucinex may help relieve congestion. Warm liquids or Chloraseptic spray may help soothe the sore throat. Follow up with PCP as needed. Return to ED should symptoms worsen.  RESOURCE GUIDE  Chronic Pain Problems: Contact [facility]  [phone] Patients need to be referred by their primary care doctor.  Insufficient Money for Medicine: Contact United Way:  call "211" or [facility] [phone].  No Primary Care Doctor: - Call Health Connect  [phone] - can help you locate a primary care doctor that  accepts your insurance, provides certain services, etc. - Physician Referral Service- [phone]  Agencies that provide inexpensive medical care: - [facility]  [phone] - [facility]  [phone] - [facility]  [phone] - Women's Clinic  [phone] - Planned Parenthood  [phone] Haynes Bast- [facility]  [phone]  Medicaid-accepting [facility] Providers: - [facility]- [address], [address]  [phone], Mon-Fri 9am-7pm, Sat 9am-1pm - [facility]- [address], [address] [place]  [phone] - [facility]- [address], Suite [place]  [phone] [facility]- [address] [place] [address]  [phone] - Renaye RakersVeita Bland- [address], [address], [phone]  Only accepts [place] Access [place]icaid patients after they have their name  applied to their card  Self Pay (no insurance) in [place]: - Sickle Cell Patients: Dr Willey BladeEric Dean, [facility]  [address], [phone] - [facility]- [address]  [phone]       Redge Gainer-     [facility]- [address], [address]       -     [facility]- see information above (Speak to CitigroupPam H if you do not have insurance)       -  [facility] Serve- [address], [phone]       -  [facility]- [address],  [phone]       -  [facility]- [address] [place] [address], [phone]       -  Dr Julio Sickssei-Bonsu-  [address], [place], [phone]       -  [facility]- [address], [phone]       -  [facility] [place]- [address] [place] [address], [phone], also [address], [phone]       -    [facility]- [address] [place], [phone], 1st & 3rd Saturday   every month, 10am-1pm  1) Find a Doctor and Pay Out of Pocket Although you won't have to find out who is covered by your insurance plan, it is a good idea to ask around and get recommendations. You will then need to call the office and see if the doctor you have chosen will accept you as a new patient and what types of options they offer for patients who are self-pay. Some doctors offer discounts or will set up payment  plans for their patients who do not have insurance, but you will need to ask so you aren't surprised when you get to your appointment.  2) Contact Your Local Health Department Not all health departments have doctors that can see patients for sick visits, but many do, so it is worth a call to see if yours does. If you don't know where your local health department is, you can check in your phone book. The CDC also has a tool to help you locate your state's health department, and many state websites also have listings of all of their local health departments.  3) Find a Walk-in Clinic If your illness is not likely to be very severe or complicated, you may want to try a walk in clinic. These are popping up all over the country in  pharmacies, drugstores, and shopping centers. They're usually staffed by nurse practitioners or physician assistants that have been trained to treat common illnesses and complaints. They're usually fairly quick and inexpensive. However, if you have serious medical issues or chronic medical problems, these are probably not your best option  STD Testing - [facility] Department of [facility] [place], STD Clinic, [address], [place], phone [phone] or [phone].  Monday - Friday, call for an appointment. [facility] Department of [employer], STD Clinic, [place] [address], [place], phone [phone] or [phone].  Monday - Friday, call for an appointment.  Abuse/Neglect: [facility] Child Abuse Hotline 501-201-0093 [facility] Child Abuse Hotline 2043835733 (After Hours)  Emergency Shelter:  [facility] (404)355-6526  Maternity Homes: - Room at the [place] [facility] [phone] - Rebeca Alert Services [phone]  MRSA Hotline #:   9565300667  [facility] Resources  [facility]  United Way [facility] Dept. [address]                 [address]         [address] [place] [address]  Blondell Reveal Phone:  [phone]                                  Phone:  [phone]                   Phone:  [phone]  [facility] Mental [facility], [phone] - [facility] - [phone]       -     [facility] in [address], [address],                                  [phone], Insurance  [place] Child Abuse Hotline (212) 200-4600 or (636)064-8774 (After Hours)   Behavioral Health Services  Substance Abuse Resources: - Alcohol and Drug Services  512-251-0178 - [facility]  [phone] -  [facility] [place] [phone] Floydene Flock [phone] - Residential & Outpatient Substance Abuse Program  [phone]  Psychological Services: [facility]  [phone] Services  [phone] - [facility], [phone] [place]. [address], [place], ACCESS LINE: 902-142-1781 or 712-101-7987, [email or web address]  Dental Assistance  If unable to pay or uninsured, contact:  Health Serve or [facility]. to become qualified for the adult dental clinic.  Patients with Medicaid: [facility] [phone], [phone] [address] [phone]  If unable to pay, or uninsured, contact [facility]erve [phone]) [phone]) or [facility] Department [phone] in [place], [phone] in [facility]) to become qualified for the adult dental clinic   Other Low-Cost Community Dental Services: - [facility]- [address] [place], [place], [place], [address], [phone], Ext. [phone], 2nd and 4th Thursday of the month at 6:30am.  10 clients each day by appointment, can sometimes see walk-in patients if someone does not show for an appointment. [facility]- [address] [place] [place], [place], [address], [phone] - [facility]- [address] [place], [place], [address], [phone] - [place] Health Department- [phone] [facility] Health Department- [phone] [facility] Department- [phone]

## 2016-04-28 NOTE — ED Provider Notes (Signed)
CSN: [id number]     Arrival date & time 04/28/16  2006 History  By signing my name below, I, [name], attest that this documentation has been prepared under the direction and in the presence of non-physician practitioner, Harolyn RutherfordShawn Karesa Maultsby, PA-C. Electronically Signed: Linna Darnerussell Quinn, Scribe. 04/28/2016. 8:39 PM.   Chief Complaint  Patient presents with  . Cough  . Fever    The history is provided by the patient. No language interpreter was used.     HPI Comments: [name] is a 25 y.o. female with h/o asthma who presents to the Emergency Department complaining of sudden onset, constant, flu-like symptoms for the past 4 days. She reports experiencing fever, dry cough, chest tightness, diaphoresis, generalized myalgias, sore throat, fatigue, and vomiting x1 since onset; pt vomited today. Pt states that her highest measured fever was 101.36F. Pt has been taking ibuprofen for her symptoms with minimal relief; she states that ibuprofen will reduce her fever for around four hours. Pt denies SOB, dizziness, headache, rash, or any other complaints.     Past Medical History  Diagnosis Date  . No pertinent past medical history   . Asthma   . Anemia   . History of chlamydia    Past Surgical History  Procedure Laterality Date  . No past surgeries     Family History  Problem Relation Age of Onset  . Anesthesia problems Mother   . Arthritis Mother   . Asthma Brother   . Seizures Maternal Aunt   . Anesthesia problems Maternal Aunt   . Hypertension Paternal Aunt   . Cancer Paternal Aunt     lung  . Hypertension Paternal Uncle   . Cancer Maternal Grandmother     cervix  . Seizures Maternal Grandmother   . Hypertension Maternal Grandfather   . Cancer Maternal Grandfather     prostate  . Hypertension Paternal Grandfather   . Mental retardation Cousin     cp x2   Social History  Substance Use Topics  . Smoking status: Current Every Day Smoker -- 0.50 packs/day for 2 years     Types: Cigarettes  . Smokeless tobacco: Never Used  . Alcohol Use: No   OB History    Gravida Para Term Preterm AB TAB SAB Ectopic Multiple Living   1 1 1       1      Review of Systems  Constitutional: Positive for fever, diaphoresis and fatigue.  HENT: Positive for sore throat. Negative for rhinorrhea, trouble swallowing and voice change.   Respiratory: Positive for cough and chest tightness. Negative for shortness of breath.   Gastrointestinal: Positive for nausea and vomiting. Negative for abdominal pain and diarrhea.  Musculoskeletal: Positive for myalgias.  Neurological: Negative for dizziness, light-headedness, numbness and headaches.  All other systems reviewed and are negative.   Allergies  Review of patient's allergies indicates no known allergies.  Home Medications   Prior to Admission medications   Medication Sig Start Date End Date Taking? Authorizing Provider  acetaminophen (TYLENOL) 325 MG tablet Take 650 mg by mouth every 6 (six) hours as needed for pain.    Historical Provider, MD  benzonatate (TESSALON) 100 MG capsule Take 1 capsule (100 mg total) by mouth every 8 (eight) hours. 04/28/16   Leeya Rusconi C Evamarie Raetz, PA-C  fexofenadine (ALLEGRA) 30 MG tablet Take 30 mg by mouth 2 (two) times daily.    Historical Provider, MD  HYDROcodone-acetaminophen (NORCO/VICODIN) 5-325 MG tablet Take 1-2 tablets by mouth every 6  hours as needed for pain and/or cough. 10/25/15   Nicole Pisciotta, PA-C  ipratropium (ATROVENT) 0.06 % nasal spray Place 2 sprays into both nostrils 4 (four) times daily. 07/23/14   Cynthia Hoff, MD  ketorolac (ACULAR) 0.5 % ophthalmic solution Place 1 drop into both eyes 4 (four) times daily. 07/23/14   Cynthia Hoff, MD  loratadine (CLARITIN) 10 MG tablet Take 10 mg by mouth daily.    Historical Provider, MD  medroxyPROGESTERone (DEPO-PROVERA) 150 MG/ML injection Inject 150 mg into the muscle every 3 (three) months. Patient states that her last shot was 2 weeks ago     Historical Provider, MD  methocarbamol (ROBAXIN) 500 MG tablet Take 2 tablets (1,000 mg total) by mouth 4 (four) times daily as needed (Pain). 10/25/15   Nicole Pisciotta, PA-C  metroNIDAZOLE (FLAGYL) 500 MG tablet Take 1 tablet (500 mg total) by mouth 2 (two) times daily. 03/22/13   Armando Reichert, CNM  ondansetron (ZOFRAN ODT) 4 MG disintegrating tablet Take 1 tablet (4 mg total) by mouth every 8 (eight) hours as needed for nausea or vomiting. 04/28/16   Kadiatou Oplinger C Jamielyn Petrucci, PA-C  predniSONE (DELTASONE) 20 MG tablet Take 2 tablets (40 mg total) by mouth daily. 10/25/15   Nicole Pisciotta, PA-C  tobramycin (TOBREX) 0.3 % ophthalmic solution Place 1 drop into the left eye every 4 (four) hours. 06/11/13   Elson Areas, PA-C   BP 137/96 mmHg  Pulse 73  Temp(Src) 98.1 F (36.7 C) (Oral)  Resp 14  SpO2 100% Physical Exam  Constitutional: She appears well-developed and well-nourished. No distress.  HENT:  Head: Normocephalic and atraumatic.  Mouth/Throat: Uvula is midline and oropharynx is clear and moist. No oropharyngeal exudate, posterior oropharyngeal edema, posterior oropharyngeal erythema or tonsillar abscesses.  Eyes: Conjunctivae are normal. Pupils are equal, round, and reactive to light.  Neck: Neck supple.  Cardiovascular: Normal rate, regular rhythm, normal heart sounds and intact distal pulses.   Pulmonary/Chest: Effort normal and breath sounds normal. No respiratory distress.  Abdominal: Soft. There is no tenderness. There is no guarding.  Musculoskeletal: She exhibits no edema or tenderness.  Lymphadenopathy:    She has no cervical adenopathy.  Neurological: She is alert.  Skin: Skin is warm and dry. She is not diaphoretic.  Psychiatric: She has a normal mood and affect. Her behavior is normal.  Nursing note and vitals reviewed.   ED Course  Procedures (including critical care time)  DIAGNOSTIC STUDIES: Oxygen Saturation is 100% on RA, normal by my interpretation.    COORDINATION  OF CARE: 8:39 PM Discussed treatment plan with pt at bedside and pt agreed to plan.    MDM   Final diagnoses:  Influenza    [name] presents with fever, cough, nausea, body aches, and sore throat for the last 4 days.  Patient's presentation is consistent with a viral illness, such as influenza. Patient is nontoxic appearing, afebrile, not tachycardic, not tachypneic, maintains SPO2 of 100% on room air, and is in no apparent distress. Patient has no signs of sepsis or other serious or life-threatening condition. Patient has no risk factors that would require admission. Home care and return precautions discussed. Patient voiced understanding of these instructions and is comfortable with discharge. Patient appears safe for discharge at this time.   I personally performed the services described in this documentation, which was scribed in my presence. The recorded information has been reviewed and is accurate.   Anselm Pancoast, PA-C 04/28/16 2114  Gerhard Munch, MD 04/29/16 2133

## 2016-05-11 ENCOUNTER — Encounter (HOSPITAL_COMMUNITY): Payer: Self-pay | Admitting: Emergency Medicine

## 2016-05-11 ENCOUNTER — Emergency Department (HOSPITAL_COMMUNITY)
Admission: EM | Admit: 2016-05-11 | Discharge: 2016-05-11 | Disposition: A | Discharge status: Home / Self Care | Payer: BLUE CROSS/BLUE SHIELD | Attending: Emergency Medicine | Admitting: Emergency Medicine

## 2016-05-11 ENCOUNTER — Emergency Department (HOSPITAL_COMMUNITY): Payer: BLUE CROSS/BLUE SHIELD

## 2016-05-11 DIAGNOSIS — Z79899 Other long term (current) drug therapy: Secondary | ICD-10-CM | POA: Diagnosis not present

## 2016-05-11 DIAGNOSIS — J45909 Unspecified asthma, uncomplicated: Secondary | ICD-10-CM | POA: Diagnosis not present

## 2016-05-11 DIAGNOSIS — F17210 Nicotine dependence, cigarettes, uncomplicated: Secondary | ICD-10-CM | POA: Diagnosis not present

## 2016-05-11 DIAGNOSIS — J029 Acute pharyngitis, unspecified: Secondary | ICD-10-CM | POA: Diagnosis present

## 2016-05-11 DIAGNOSIS — J069 Acute upper respiratory infection, unspecified: Secondary | ICD-10-CM | POA: Diagnosis not present

## 2016-05-11 LAB — RAPID STREP SCREEN (MED CTR MEBANE ONLY): STREPTOCOCCUS, GROUP A SCREEN (DIRECT): NEGATIVE

## 2016-05-11 NOTE — Discharge Instructions (Signed)
Take Tylenol or Motrin for any headache, body pain, fever. Continue salt water gargles for sore throat. You can take over-the-counter cold and flu medications, or you can try Sudafed, Robitussin to treat your symptoms. make sure to get plenty of rest, drink plenty of fluids, eat well-balanced meals. Follow-up with a primary care doctor.    Upper Respiratory Infection, Adult Most upper respiratory infections (URIs) are a viral infection of the air passages leading to the lungs. A URI affects the nose, throat, and upper air passages. The most common type of URI is nasopharyngitis and is typically referred to as "the common cold." URIs run their course and usually go away on their own. Most of the time, a URI does not require medical attention, but sometimes a bacterial infection in the upper airways can follow a viral infection. This is called a secondary infection. Sinus and middle ear infections are common types of secondary upper respiratory infections. Bacterial pneumonia can also complicate a URI. A URI can worsen asthma and chronic obstructive pulmonary disease (COPD). Sometimes, these complications can require emergency medical care and may be life threatening.  CAUSES Almost all URIs are caused by viruses. A virus is a type of germ and can spread from one person to another.  RISKS FACTORS You may be at risk for a URI if:   You smoke.   You have chronic heart or lung disease.  You have a weakened defense (immune) system.   You are very young or very old.   You have nasal allergies or asthma.  You work in crowded or poorly ventilated areas.  You work in health care facilities or schools. SIGNS AND SYMPTOMS  Symptoms typically develop 2-3 days after you come in contact with a cold virus. Most viral URIs last 7-10 days. However, viral URIs from the influenza virus (flu virus) can last 14-18 days and are typically more severe. Symptoms may include:   Runny or stuffy (congested) nose.    Sneezing.   Cough.   Sore throat.   Headache.   Fatigue.   Fever.   Loss of appetite.   Pain in your forehead, behind your eyes, and over your cheekbones (sinus pain).  Muscle aches.  DIAGNOSIS  Your health care provider may diagnose a URI by:  Physical exam.  Tests to check that your symptoms are not due to another condition such as:  Strep throat.  Sinusitis.  Pneumonia.  Asthma. TREATMENT  A URI goes away on its own with time. It cannot be cured with medicines, but medicines may be prescribed or recommended to relieve symptoms. Medicines may help:  Reduce your fever.  Reduce your cough.  Relieve nasal congestion. HOME CARE INSTRUCTIONS   Take medicines only as directed by your health care provider.   Gargle warm saltwater or take cough drops to comfort your throat as directed by your health care provider.  Use a warm mist humidifier or inhale steam from a shower to increase air moisture. This may make it easier to breathe.  Drink enough fluid to keep your urine clear or pale yellow.   Eat soups and other clear broths and maintain good nutrition.   Rest as needed.   Return to work when your temperature has returned to normal or as your health care provider advises. You may need to stay home longer to avoid infecting others. You can also use a face mask and careful hand washing to prevent spread of the virus.  Increase the usage of your  inhaler if you have asthma.   Do not use any tobacco products, including cigarettes, chewing tobacco, or electronic cigarettes. If you need help quitting, ask your health care provider. PREVENTION  The best way to protect yourself from getting a cold is to practice good hygiene.   Avoid oral or hand contact with people with cold symptoms.   Wash your hands often if contact occurs.  There is no clear evidence that vitamin C, vitamin E, echinacea, or exercise reduces the chance of developing a cold.  However, it is always recommended to get plenty of rest, exercise, and practice good nutrition.  SEEK MEDICAL CARE IF:   You are getting worse rather than better.   Your symptoms are not controlled by medicine.   You have chills.  You have worsening shortness of breath.  You have brown or red mucus.  You have yellow or brown nasal discharge.  You have pain in your face, especially when you bend forward.  You have a fever.  You have swollen neck glands.  You have pain while swallowing.  You have white areas in the back of your throat. SEEK IMMEDIATE MEDICAL CARE IF:   You have severe or persistent:  Headache.  Ear pain.  Sinus pain.  Chest pain.  You have chronic lung disease and any of the following:  Wheezing.  Prolonged cough.  Coughing up blood.  A change in your usual mucus.  You have a stiff neck.  You have changes in your:  Vision.  Hearing.  Thinking.  Mood. MAKE SURE YOU:   Understand these instructions.  Will watch your condition.  Will get help right away if you are not doing well or get worse.   This information is not intended to replace advice given to you by your health care provider. Make sure you discuss any questions you have with your health care provider.   Document Released: 05/30/2001 Document Revised: 04/20/2015 Document Reviewed: 03/11/2014 Elsevier Interactive Patient Education [employer].

## 2016-05-11 NOTE — ED Provider Notes (Signed)
CSN: [id number]     Arrival date & time 05/11/16  2013 History   By signing my name below, I, [name], attest that this documentation has been prepared under the direction and in the presence of non-physician practitioner, Jaynie Crumble, PA-C. Electronically Signed: [name], Scribe. 05/11/2016. 9:03 PM.   Chief Complaint  Patient presents with  . Sore Throat  . Fever    The history is provided by the patient. No language interpreter was used.   HPI Comments:  [name] is a 25 y.o. female with PMHx of asthma who presents to the Emergency Department complaining of sore throat onset 5 days ago. Pt reports associated hoarse voice, persistent cough productive of thick "greyish" mucus for 2 weeks, and nasal congestion. Pt was evaluated in the ED 2 weeks ago and dx with the flu. She was feeling better until 5 days ago when her current symptoms started. She has taken OTC pain reliever, used salt water gargles and been drinking tea without relief. Denies sick contacts, fever or rhinorrhea.Denies fever. Denies neck pain or stiffness. No photophobia. No difficulty swallowing.    Past Medical History  Diagnosis Date  . No pertinent past medical history   . Asthma   . Anemia   . History of chlamydia    Past Surgical History  Procedure Laterality Date  . No past surgeries     Family History  Problem Relation Age of Onset  . Anesthesia problems Mother   . Arthritis Mother   . Asthma Brother   . Seizures Maternal Aunt   . Anesthesia problems Maternal Aunt   . Hypertension Paternal Aunt   . Cancer Paternal Aunt     lung  . Hypertension Paternal Uncle   . Cancer Maternal Grandmother     cervix  . Seizures Maternal Grandmother   . Hypertension Maternal Grandfather   . Cancer Maternal Grandfather     prostate  . Hypertension Paternal Grandfather   . Mental retardation Cousin     cp x2   Social History  Substance Use Topics  . Smoking status: Current Every  Day Smoker -- 0.50 packs/day for 2 years    Types: Cigarettes  . Smokeless tobacco: Never Used  . Alcohol Use: No   OB History    Gravida Para Term Preterm AB TAB SAB Ectopic Multiple Living   Review of Systems  Constitutional: Negative for fever.  HENT: Positive for congestion, sore throat and voice change. Negative for rhinorrhea.   Respiratory: Positive for cough.     Allergies  Review of patient's allergies indicates no known allergies.  Home Medications   Prior to Admission medications   Medication Sig Start Date End Date Taking? Authorizing Provider  acetaminophen (TYLENOL) 325 MG tablet Take 650 mg by mouth every 6 (six) hours as needed for pain.    Historical Provider, MD  benzonatate (TESSALON) 100 MG capsule Take 1 capsule (100 mg total) by mouth every 8 (eight) hours. 04/28/16   Shawn C Joy, PA-C  fexofenadine (ALLEGRA) 30 MG tablet Take 30 mg by mouth 2 (two) times daily.    Historical Provider, MD  HYDROcodone-acetaminophen (NORCO/VICODIN) 5-325 MG tablet Take 1-2 tablets by mouth every 6 hours as needed for pain and/or cough. 10/25/15   Nicole Pisciotta, PA-C  ipratropium (ATROVENT) 0.06 % nasal spray Place 2 sprays into both nostrils 4 (four) times daily. 07/23/14   Quita Skye  Kindl, MD  ketorolac (ACULAR) 0.5 % ophthalmic solution Place 1 drop into both eyes 4 (four) times daily. 07/23/14   Linna Hoff, MD  loratadine (CLARITIN) 10 MG tablet Take 10 mg by mouth daily.    Historical Provider, MD  medroxyPROGESTERone (DEPO-PROVERA) 150 MG/ML injection Inject 150 mg into the muscle every 3 (three) months. Patient states that her last shot was 2 weeks ago    Historical Provider, MD  methocarbamol (ROBAXIN) 500 MG tablet Take 2 tablets (1,000 mg total) by mouth 4 (four) times daily as needed (Pain). 10/25/15   Nicole Pisciotta, PA-C  metroNIDAZOLE (FLAGYL) 500 MG tablet Take 1 tablet (500 mg total) by mouth 2 (two) times daily. 03/22/13   Armando Reichert, CNM   ondansetron (ZOFRAN ODT) 4 MG disintegrating tablet Take 1 tablet (4 mg total) by mouth every 8 (eight) hours as needed for nausea or vomiting. 04/28/16   Shawn C Joy, PA-C  predniSONE (DELTASONE) 20 MG tablet Take 2 tablets (40 mg total) by mouth daily. 10/25/15   Nicole Pisciotta, PA-C  tobramycin (TOBREX) 0.3 % ophthalmic solution Place 1 drop into the left eye every 4 (four) hours. 06/11/13   Elson Areas, PA-C   BP 156/94 mmHg  Pulse 76  Temp(Src) 98.2 F (36.8 C) (Oral)  Resp 16  SpO2 100%   Physical Exam  Constitutional: She is oriented to person, place, and time. She appears well-developed and well-nourished. No distress.  HENT:  Head: Normocephalic and atraumatic.  Right Ear: External ear normal.  Left Ear: External ear normal.  Nose: Nose normal.  Mouth/Throat: Oropharynx is clear and moist.  TMs normal. Tonsils normal. No exudate. Uvula midline  Eyes: Conjunctivae are normal. Right eye exhibits no discharge. Left eye exhibits no discharge.  Neck: Neck supple.  Cardiovascular: Normal rate, regular rhythm and normal heart sounds.   Pulmonary/Chest: Effort normal and breath sounds normal. No respiratory distress. She has no wheezes. She has no rales.  Neurological: She is alert and oriented to person, place, and time. Coordination normal.  Skin: Skin is warm and dry. No rash noted. She is not diaphoretic.  Psychiatric: She has a normal mood and affect. Her behavior is normal.  Nursing note and vitals reviewed.   ED Course  Procedures  DIAGNOSTIC STUDIES:  Oxygen Saturation is 100% on RA, normal by my interpretation.    COORDINATION OF CARE:  8:56 PM Will order chest x-ray. Discussed treatment plan with pt at bedside and pt agreed to plan.  Labs Review Labs Reviewed  RAPID STREP SCREEN (NOT AT [facility])  CULTURE, GROUP A STREP [facility])    Imaging Review Dg Chest 2 View  05/11/2016  CLINICAL DATA:  Cough for 3 weeks.  Shortness of breath. EXAM: CHEST  2 VIEW  COMPARISON:  04/07/2012 FINDINGS: The cardiomediastinal contours are normal. The lungs are clear. Pulmonary vasculature is normal. No consolidation, pleural effusion, or pneumothorax. No acute osseous abnormalities are seen. IMPRESSION: No acute pulmonary process. Electronically Signed   By: Rubye Oaks M.D.   On: 05/11/2016 21:26   I have personally reviewed and evaluated these images and lab results as part of my medical decision-making.   EKG Interpretation None      MDM   Final diagnoses:  URI (upper respiratory infection)   Patient with URI symptoms. States she just had upper respiratory infection for several weeks, and it returned. She states she has had persistent cough for over 2 weeks. Chest x-ray obtained to rule out  pneumonia and is negative. Patient's exam is unremarkable. Her vital signs are normal other than mild hypertension. Suspect her symptoms are viral in nature given no fever, no objective findings on exam. We'll plan to continue to treat symptomatically, follow up with family doctor.  Filed Vitals:   05/11/16 2029  BP: 156/94  Pulse: 76  Temp: 98.2 F (36.8 C)  TempSrc: Oral  Resp: 16  SpO2: 100%    I personally performed the services described in this documentation, which was scribed in my presence. The recorded information has been reviewed and is accurate.   Jaynie Crumbleatyana Dhyana Bastone, PA-C 05/11/16 2145  Rolan BuccoMelanie Belfi, MD 05/11/16 2258

## 2016-05-11 NOTE — ED Notes (Signed)
Pt c/o sore throat, fever, congestion, and chills since Saturday. Pt has not checked temperature at home and does not have fever at this time. Pt took tylenol at 2 pm and allergy medication last night. A&Ox4 and ambulatory.

## 2016-05-14 LAB — CULTURE, GROUP A STREP (THRC)

## 2016-07-05 ENCOUNTER — Ambulatory Visit: Payer: BLUE CROSS/BLUE SHIELD | Admitting: Family Medicine

## 2016-07-05 DIAGNOSIS — Z0289 Encounter for other administrative examinations: Secondary | ICD-10-CM

## 2016-08-22 ENCOUNTER — Ambulatory Visit (HOSPITAL_COMMUNITY)
Admission: EM | Admit: 2016-08-22 | Discharge: 2016-08-22 | Disposition: A | Discharge status: Home / Self Care | Payer: BLUE CROSS/BLUE SHIELD | Attending: Family Medicine | Admitting: Family Medicine

## 2016-08-22 DIAGNOSIS — J029 Acute pharyngitis, unspecified: Secondary | ICD-10-CM | POA: Diagnosis not present

## 2016-08-22 MED ORDER — AMOXICILLIN 500 MG PO CAPS
500.0000 mg | ORAL_CAPSULE | Freq: Three times a day (TID) | ORAL | 0 refills | Status: DC
Start: 2016-08-22 — End: 2017-03-30

## 2016-08-22 NOTE — ED Provider Notes (Signed)
[facility]    CSN: [id number] Arrival date & time: 08/22/16  1958  First Provider Contact:  First MD Initiated Contact with Patient 08/22/16 2050        History   Chief Complaint Chief Complaint  Patient presents with  . Sore Throat    HPI [name] is a 25 y.o. female.   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 She has one week of sore throat and tender swollen glands under her left mandible. She's had some night sweats.  She has not taken her temperature. She does not have chronic fatigue. She denies ear pain, cough, abdominal pain.    Past Medical History:  Diagnosis Date  . Anemia   . Asthma   . History of chlamydia   . No pertinent past medical history     There are no active problems to display for this patient.   Past Surgical History:  Procedure Laterality Date  . NO PAST SURGERIES      OB History    Gravida Para Term Preterm AB Living   1 1 1     1    SAB TAB Ectopic Multiple Live Births           1       Home Medications    Prior to Admission medications   Medication Sig Start Date End Date Taking? Authorizing Provider  amoxicillin (AMOXIL) 500 MG capsule Take 1 capsule (500 mg total) by mouth 3 (three) times daily. 08/22/16   Elvina SidleKurt Rima Blizzard, MD  ipratropium (ATROVENT) 0.06 % nasal spray Place 2 sprays into both nostrils 4 (four) times daily. 07/23/14   Linna HoffJames D Kindl, MD  ketorolac (ACULAR) 0.5 % ophthalmic solution Place 1 drop into both eyes 4 (four) times daily. 07/23/14   Linna HoffJames D Kindl, MD  loratadine (CLARITIN) 10 MG tablet Take 10 mg by mouth daily.    Historical Provider, MD  medroxyPROGESTERone (DEPO-PROVERA) 150 MG/ML injection Inject 150 mg into the muscle every 3 (three) months. Patient states that her last shot was 2 weeks ago    Historical Provider, MD  methocarbamol (ROBAXIN) 500 MG tablet Take 2 tablets (1,000 mg total) by mouth 4 (four) times daily as needed (Pain). 10/25/15    Nicole Pisciotta, PA-C  metroNIDAZOLE (FLAGYL) 500 MG tablet Take 1 tablet (500 mg total) by mouth 2 (two) times daily. 03/22/13   Armando ReichertHeather D Hogan, CNM  ondansetron (ZOFRAN ODT) 4 MG disintegrating tablet Take 1 tablet (4 mg total) by mouth every 8 (eight) hours as needed for nausea or vomiting. 04/28/16   Shawn C Joy, PA-C  predniSONE (DELTASONE) 20 MG tablet Take 2 tablets (40 mg total) by mouth daily. 10/25/15   Nicole Pisciotta, PA-C  tobramycin (TOBREX) 0.3 % ophthalmic solution Place 1 drop into the left eye every 4 (four) hours. 06/11/13   Elson AreasLeslie K Sofia, PA-C    Family History Family History  Problem Relation Age of Onset  . Anesthesia problems Mother   . Arthritis Mother   . Asthma Brother   . Seizures Maternal Aunt   . Anesthesia problems Maternal Aunt   . Hypertension Paternal Aunt   . Cancer Paternal Aunt     lung  . Hypertension Paternal Uncle   . Cancer Maternal Grandmother     cervix  . Seizures Maternal Grandmother   . Hypertension Maternal Grandfather   . Cancer Maternal Grandfather     prostate  . Hypertension Paternal  Grandfather   . Mental retardation Cousin     cp x2    Social History Social History  Substance Use Topics  . Smoking status: Current Every Day Smoker    Packs/day: 0.50    Years: 2.00    Types: Cigarettes  . Smokeless tobacco: Never Used  . Alcohol use No     Allergies   Review of patient's allergies indicates no known allergies.   Review of Systems Review of Systems  Constitutional: Negative.   HENT: Positive for sore throat. Negative for congestion, dental problem and ear pain.   Eyes: Negative.   Respiratory: Negative.   Cardiovascular: Negative.   Genitourinary: Negative.      Physical Exam Triage Vital Signs ED Triage Vitals [08/22/16 2103]  Enc Vitals Group     BP 132/87     Pulse Rate 73     Resp 18     Temp 98.7 F (37.1 C)     Temp src      SpO2 100 %     Weight      Height      Head Circumference      Peak  Flow      Pain Score      Pain Loc      Pain Edu?      Excl. in GC?    No data found.   Updated Vital Signs BP 132/87   Pulse 73   Temp 98.7 F (37.1 C)   Resp 18   SpO2 100%   Visual Acuity Right Eye Distance:   Left Eye Distance:   Bilateral Distance:    Right Eye Near:   Left Eye Near:    Bilateral Near:     Physical Exam  Constitutional: She is oriented to person, place, and time. She appears well-developed and well-nourished.  HENT:  Mouth/Throat: No oropharyngeal exudate.  Moderate erythema in posterior pharynx bilaterally  Eyes: EOM are normal. Pupils are equal, round, and reactive to light.  Neck: Normal range of motion.  Cardiovascular: Normal rate.   Pulmonary/Chest: Effort normal.  Lymphadenopathy:    She has no cervical adenopathy.  Neurological: She is alert and oriented to person, place, and time.  Nursing note and vitals reviewed.    UC Treatments / Results  Labs (all labs ordered are listed, but only abnormal results are displayed) Labs Reviewed - No data to display  EKG  EKG Interpretation None       Radiology No results found.  Procedures Procedures (including critical care time)  Medications Ordered in UC Medications - No data to display   Initial Impression / Assessment and Plan / UC Course  I have reviewed the triage vital signs and the nursing notes.  Pertinent labs & imaging results that were available during my care of the patient were reviewed by me and considered in my medical decision making (see chart for details).  Clinical Course      Final Clinical Impressions(s) / UC Diagnoses   Final diagnoses:  Pharyngitis    New Prescriptions New Prescriptions   AMOXICILLIN (AMOXIL) 500 MG CAPSULE    Take 1 capsule (500 mg total) by mouth 3 (three) times daily.     Elvina Sidle, MD 08/22/16 2114

## 2016-08-22 NOTE — ED Triage Notes (Signed)
Pt here for sore throat, trouble swallowing x 1 week.

## 2016-10-17 ENCOUNTER — Encounter (HOSPITAL_COMMUNITY): Payer: Self-pay | Admitting: Emergency Medicine

## 2016-10-17 ENCOUNTER — Emergency Department (HOSPITAL_COMMUNITY)
Admission: EM | Admit: 2016-10-17 | Discharge: 2016-10-17 | Disposition: A | Discharge status: Home / Self Care | Payer: BLUE CROSS/BLUE SHIELD | Attending: Emergency Medicine | Admitting: Emergency Medicine

## 2016-10-17 DIAGNOSIS — J309 Allergic rhinitis, unspecified: Secondary | ICD-10-CM | POA: Insufficient documentation

## 2016-10-17 DIAGNOSIS — F17210 Nicotine dependence, cigarettes, uncomplicated: Secondary | ICD-10-CM | POA: Insufficient documentation

## 2016-10-17 DIAGNOSIS — R0981 Nasal congestion: Secondary | ICD-10-CM | POA: Diagnosis present

## 2016-10-17 DIAGNOSIS — R591 Generalized enlarged lymph nodes: Secondary | ICD-10-CM

## 2016-10-17 LAB — CBC WITH DIFFERENTIAL/PLATELET
BASOS ABS: 0 10*3/uL (ref 0.0–0.1)
BASOS PCT: 0 %
EOS PCT: 4 %
Eosinophils Absolute: 0.2 10*3/uL (ref 0.0–0.7)
HEMATOCRIT: 38.5 % (ref 36.0–46.0)
Hemoglobin: 13 g/dL (ref 12.0–15.0)
LYMPHS PCT: 32 %
Lymphs Abs: 2 10*3/uL (ref 0.7–4.0)
MCH: 30.7 pg (ref 26.0–34.0)
MCHC: 33.8 g/dL (ref 30.0–36.0)
MCV: 91 fL (ref 78.0–100.0)
MONO ABS: 0.4 10*3/uL (ref 0.1–1.0)
Monocytes Relative: 7 %
NEUTROS ABS: 3.7 10*3/uL (ref 1.7–7.7)
Neutrophils Relative %: 57 %
PLATELETS: 208 10*3/uL (ref 150–400)
RBC: 4.23 MIL/uL (ref 3.87–5.11)
RDW: 13.2 % (ref 11.5–15.5)
WBC: 6.4 10*3/uL (ref 4.0–10.5)

## 2016-10-17 MED ORDER — FLUTICASONE PROPIONATE 50 MCG/ACT NA SUSP: 2.0000 | Freq: Every day | NASAL | 0 refills | Status: DC

## 2016-10-17 NOTE — Discharge Instructions (Signed)
Read the information below.  Your labs were re-assuring.  I have prescribed flonase for relief of nasal symptoms. Continue Claritin as needed for for further relief. It is important that you follow up with a primary provider for further evaluation of your lymph node tenderness. You can follow up with Dr. Duanne Guessewey or you can call the number provided in your paperwork to establish a new primary provider.  Use the prescribed medication as directed.  Please discuss all new medications with your pharmacist.   You may return to the Emergency Department at any time for worsening condition or any new symptoms that concern you.

## 2016-10-17 NOTE — ED Provider Notes (Signed)
[facility] Provider Note   CSN: [id number] Arrival date & time: 10/17/16  1540  By signing my name below, I, [name], attest that this documentation has been prepared under the direction and in the presence of non-physician practitioner, Arvilla MeresAshley Meyer, PA-C. Electronically Signed: Majel HomerPeyton Quinn, Scribe. 10/17/2016. 5:12 PM.   History   Chief Complaint Chief Complaint  Patient presents with  . Lymphadenopathy  . Nasal Congestion   The history is provided by the patient. No language interpreter was used.   HPI Comments: [name] is a 25 y.o. female who presents to the Emergency Department for an evaluation of bilateral swollen axillary lymph nodes that began a few weeks ago. Pt reports her pain begins in her left axilla and radiates into her left breast; she notes her pain is worse in her left axilla compared to her right. She also states she has felt "sick" for the past six months with multiple infections and multiple rounds of antibiotics. 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 No recent travel outside [place]. No exposure to cats. Pt denies sore throat, ear pain, chest pain, cough, shortness of breath, fever, night sweats, chills, unexpected weight loss, dysuria, discharge from nipples, rash, breast masses, joint pain,  hx of abscesses, or h/o cancer. She reports FHx of prostate cancer in her grandfather. She notes Implanon placement and receives her menstrual period "sporadically." She also complains of PND, clear rhinorrhea, itchy nose, watery/itchy eyes x 2 weeks. She has taken Claritin with improvement in these symptoms; she believes they are allergies.    Past Medical History:  Diagnosis Date  . Anemia   . Asthma   . History of chlamydia   . No pertinent past medical history    There are no active problems to display for this patient.  Past Surgical History:  Procedure Laterality Date  . NO  PAST SURGERIES      OB History    Gravida Para Term Preterm AB Living   1 1 1     1    SAB TAB Ectopic Multiple Live Births           1     Home Medications    Prior to Admission medications   Medication Sig Start Date End Date Taking? Authorizing Provider  acetaminophen (TYLENOL) 500 MG tablet Take 500 mg by mouth every 6 (six) hours as needed (pain.).   Yes Historical Provider, MD  etonogestrel (NEXPLANON) 68 MG IMPL implant 1 each by Subdermal route once.   Yes Historical Provider, MD  amoxicillin (AMOXIL) 500 MG capsule Take 1 capsule (500 mg total) by mouth 3 (three) times daily. Patient not taking: Reported on 10/17/2016 08/22/16   Elvina SidleKurt Lauenstein, MD  fluticasone [facility](FLONASE) 50 MCG/ACT nasal spray Place 2 sprays into both nostrils daily. 10/17/16   Lona KettleAshley Laurel Meyer, PA-C    Family History Family History  Problem Relation Age of Onset  . Anesthesia problems Mother   . Arthritis Mother   . Asthma Brother   . Seizures Maternal Aunt   . Anesthesia problems Maternal Aunt   . Hypertension Paternal Aunt   . Cancer Paternal Aunt     lung  . Hypertension Paternal Uncle   . Cancer Maternal Grandmother     cervix  . Seizures Maternal Grandmother   . Hypertension Maternal Grandfather   . Cancer Maternal Grandfather     prostate  . Hypertension Paternal  Grandfather   . Mental retardation Cousin     cp x2    Social History Social History  Substance Use Topics  . Smoking status: Current Every Day Smoker    Packs/day: 0.50    Years: 2.00    Types: Cigarettes  . Smokeless tobacco: Never Used  . Alcohol use No   Allergies   Review of patient's allergies indicates no known allergies.  Review of Systems Review of Systems  Constitutional: Negative for chills, fever and unexpected weight change.  HENT: Positive for congestion, postnasal drip, sinus pressure and trouble swallowing. Negative for ear pain and sore throat.   Eyes: Negative for visual disturbance.    Respiratory: Negative for cough and shortness of breath.   Cardiovascular: Negative for chest pain.  Gastrointestinal: Positive for nausea. Negative for abdominal pain, constipation, diarrhea and vomiting.  Genitourinary: Negative for dysuria and hematuria.  Musculoskeletal: Negative for arthralgias and myalgias.  Skin: Negative for rash.  Neurological: Negative for dizziness, syncope, light-headedness and headaches.   Physical Exam Updated Vital Signs BP 130/81   Pulse 79   Temp 98.1 F (36.7 C) (Oral)   Resp 16   SpO2 100%   Physical Exam  Constitutional: She appears well-developed and well-nourished. No distress.  HENT:  Head: Normocephalic and atraumatic.  Nose: Mucosal edema present.  Mouth/Throat: Oropharynx is clear and moist. No oropharyngeal exudate.  Eyes: Conjunctivae and EOM are normal. Pupils are equal, round, and reactive to light. Right eye exhibits no discharge. Left eye exhibits no discharge. No scleral icterus.  Neck: Normal range of motion and phonation normal. Neck supple. No neck rigidity. Normal range of motion present.  Cardiovascular: Normal rate, regular rhythm, normal heart sounds and intact distal pulses.   No murmur heard. Pulmonary/Chest: Effort normal and breath sounds normal. No stridor. No respiratory distress. She has no wheezes. She has no rales.  Chaperone present for duration of breast exam. No skin changes appreciated. No nipple discharge. No palpable masses.   Abdominal: Soft. Bowel sounds are normal. She exhibits no distension. There is no tenderness. There is no rigidity, no rebound, no guarding and no CVA tenderness.  Musculoskeletal: Normal range of motion.  Lymphadenopathy:    She has no cervical adenopathy.  TTP of axillary lymph nodes b/l. Shotty swelling of axillary LN.   Neurological: She is alert. She is not disoriented. Coordination and gait normal. GCS eye subscore is 4. GCS verbal subscore is 5. GCS motor subscore is 6.  Skin:  Skin is warm and dry. She is not diaphoretic.  No area of erythema, induration, or fluctuance noted to axilla b/l.  Psychiatric: She has a normal mood and affect. Her behavior is normal.   ED Treatments / Results  Labs (all labs ordered are listed, but only abnormal results are displayed) Labs Reviewed  CBC WITH DIFFERENTIAL/PLATELET    EKG  EKG Interpretation None       Radiology No results found.  Procedures Procedures (including critical care time)  Medications Ordered in ED Medications - No data to display  DIAGNOSTIC STUDIES:  Oxygen Saturation is 100% on RA, normal by my interpretation.    COORDINATION OF CARE:  5:07 PM Discussed treatment plan with pt at bedside and pt agreed to plan.  Initial Impression / Assessment and Plan / ED Course  I have reviewed the triage vital signs and the nursing notes.  Pertinent labs & imaging results that were available during my care of the patient were reviewed by me and  considered in my medical decision making (see chart for details).  Clinical Course    Patient presents to ED with complaint of b/l axillary lymphadenopathy x 1 week. No treavel outside [place]. No exposure to cats. No unexpected [facility], hemoptysis, or night sweats. Patient is afebrile and non-toxic appearing in NAD. VSS. Mild nasal mucosal edema. Mild TTP of axilla LN b/l. Shotty LN swelling appreciated b/l. No erythema, induration, or area of fluctuance to suggest abscess. Normal breast exam. Lungs are CTABL. Heart RRR. No rashes appreciated. CBC re-assuring.  Unclear etiology - ?reactive. Encouraged patient to follow up with PCP for further evaluation. Suspect nasal congestion, watery eyes, pruritic eyes, and itchy nose secondary to allergies - continue Claritin for relief. Return precautions given. Patient voiced understanding and is agreeable.    I personally performed the services described in this documentation, which was scribed in my presence. The recorded  information has been reviewed and is accurate.   Final Clinical Impressions(s) / ED Diagnoses   Final diagnoses:  Lymphadenopathy  Allergic rhinitis, unspecified chronicity, unspecified seasonality, unspecified trigger    New Prescriptions Discharge Medication List as of 10/17/2016  6:36 PM    START taking these medications   Details  fluticasone (FLONASE) 50 MCG/ACT nasal spray Place 2 sprays into both nostrils daily., Starting Tue 10/17/2016, Print         Lona Kettle, PA-C 10/18/16 0037    Melene Plan, DO 10/18/16 [phone]

## 2016-10-17 NOTE — ED Triage Notes (Signed)
Pt reports bilateral swollen tender lymph nodes in axilla. Pt otherwise has had nasal congestion. No cough. No obvious swelling noted.

## 2017-03-30 ENCOUNTER — Emergency Department (HOSPITAL_COMMUNITY): Payer: BLUE CROSS/BLUE SHIELD

## 2017-03-30 ENCOUNTER — Encounter (HOSPITAL_COMMUNITY): Payer: Self-pay | Admitting: *Deleted

## 2017-03-30 ENCOUNTER — Emergency Department (HOSPITAL_COMMUNITY)
Admission: EM | Admit: 2017-03-30 | Discharge: 2017-03-30 | Disposition: A | Discharge status: Home / Self Care | Payer: BLUE CROSS/BLUE SHIELD | Attending: Emergency Medicine | Admitting: Emergency Medicine

## 2017-03-30 DIAGNOSIS — J45909 Unspecified asthma, uncomplicated: Secondary | ICD-10-CM | POA: Insufficient documentation

## 2017-03-30 DIAGNOSIS — R112 Nausea with vomiting, unspecified: Secondary | ICD-10-CM | POA: Diagnosis not present

## 2017-03-30 DIAGNOSIS — F17210 Nicotine dependence, cigarettes, uncomplicated: Secondary | ICD-10-CM | POA: Insufficient documentation

## 2017-03-30 DIAGNOSIS — Z79899 Other long term (current) drug therapy: Secondary | ICD-10-CM | POA: Diagnosis not present

## 2017-03-30 DIAGNOSIS — R1013 Epigastric pain: Secondary | ICD-10-CM | POA: Diagnosis not present

## 2017-03-30 DIAGNOSIS — R197 Diarrhea, unspecified: Secondary | ICD-10-CM

## 2017-03-30 LAB — CBC WITH DIFFERENTIAL/PLATELET
BASOS ABS: 0 10*3/uL (ref 0.0–0.1)
Basophils Relative: 0 %
EOS ABS: 0 10*3/uL (ref 0.0–0.7)
Eosinophils Relative: 1 %
HCT: 38.3 % (ref 36.0–46.0)
Hemoglobin: 12.8 g/dL (ref 12.0–15.0)
LYMPHS PCT: 17 %
Lymphs Abs: 0.7 10*3/uL (ref 0.7–4.0)
MCH: 30.5 pg (ref 26.0–34.0)
MCHC: 33.4 g/dL (ref 30.0–36.0)
MCV: 91.2 fL (ref 78.0–100.0)
MONO ABS: 0.5 10*3/uL (ref 0.1–1.0)
Monocytes Relative: 13 %
Neutro Abs: 2.7 10*3/uL (ref 1.7–7.7)
Neutrophils Relative %: 69 %
Platelets: 183 10*3/uL (ref 150–400)
RBC: 4.2 MIL/uL (ref 3.87–5.11)
RDW: 13.3 % (ref 11.5–15.5)
WBC: 3.8 10*3/uL — AB (ref 4.0–10.5)

## 2017-03-30 LAB — URINALYSIS, ROUTINE W REFLEX MICROSCOPIC
GLUCOSE, UA: NEGATIVE mg/dL
HGB URINE DIPSTICK: NEGATIVE
KETONES UR: 15 mg/dL — AB
Nitrite: POSITIVE — AB
PROTEIN: NEGATIVE mg/dL
Specific Gravity, Urine: >1.03 — ABNORMAL HIGH (ref 1.005–1.030)
pH: 6 (ref 5.0–8.0)

## 2017-03-30 LAB — LIPASE, BLOOD: LIPASE: 20 U/L (ref 11–51)

## 2017-03-30 LAB — COMPREHENSIVE METABOLIC PANEL
ALK PHOS: 34 U/L — AB (ref 38–126)
ALT: 12 U/L — AB (ref 14–54)
AST: 17 U/L (ref 15–41)
Albumin: 3.9 g/dL (ref 3.5–5.0)
Anion gap: 8 (ref 5–15)
BILIRUBIN TOTAL: 0.6 mg/dL (ref 0.3–1.2)
BUN: 8 mg/dL (ref 6–20)
CALCIUM: 9.1 mg/dL (ref 8.9–10.3)
CHLORIDE: 106 mmol/L (ref 101–111)
CO2: 21 mmol/L — ABNORMAL LOW (ref 22–32)
CREATININE: 0.91 mg/dL (ref 0.44–1.00)
Glucose, Bld: 82 mg/dL (ref 65–99)
Potassium: 3.4 mmol/L — ABNORMAL LOW (ref 3.5–5.1)
Sodium: 135 mmol/L (ref 135–145)
Total Protein: 6.6 g/dL (ref 6.5–8.1)

## 2017-03-30 LAB — URINALYSIS, MICROSCOPIC (REFLEX)

## 2017-03-30 LAB — POC URINE PREG, ED: PREG TEST UR: NEGATIVE

## 2017-03-30 MED ORDER — METOCLOPRAMIDE HCL 5 MG/ML IJ SOLN
10.0000 mg | Freq: Once | INTRAMUSCULAR | Status: AC
  Administered 2017-03-30: 10 mg via INTRAVENOUS
  Filled 2017-03-30: qty 2

## 2017-03-30 MED ORDER — ONDANSETRON HCL 4 MG/2ML IJ SOLN
4.0000 mg | Freq: Once | INTRAMUSCULAR | Status: AC
  Administered 2017-03-30: 4 mg via INTRAVENOUS
  Filled 2017-03-30: qty 2

## 2017-03-30 MED ORDER — SODIUM CHLORIDE 0.9 % IV BOLUS (SEPSIS)
1000.0000 mL | Freq: Once | INTRAVENOUS | Status: AC
  Administered 2017-03-30: 1000 mL via INTRAVENOUS

## 2017-03-30 MED ORDER — ONDANSETRON HCL 4 MG PO TABS: 4.0000 mg | ORAL_TABLET | Freq: Four times a day (QID) | ORAL | 0 refills | Status: DC

## 2017-03-30 MED ORDER — SODIUM CHLORIDE 0.9 % IV BOLUS (SEPSIS): 1000.0000 mL | Freq: Once | INTRAVENOUS | Status: DC

## 2017-03-30 MED ORDER — CEPHALEXIN 500 MG PO CAPS: 500.0000 mg | ORAL_CAPSULE | Freq: Two times a day (BID) | ORAL | 0 refills | Status: DC

## 2017-03-30 MED ORDER — MORPHINE SULFATE (PF) 4 MG/ML IV SOLN
4.0000 mg | Freq: Once | INTRAVENOUS | Status: AC
  Administered 2017-03-30: 4 mg via INTRAVENOUS
  Filled 2017-03-30: qty 1

## 2017-03-30 MED ORDER — FAMOTIDINE 20 MG PO TABS: 20.0000 mg | ORAL_TABLET | Freq: Two times a day (BID) | ORAL | 0 refills | Status: DC

## 2017-03-30 MED ORDER — SODIUM CHLORIDE 0.9 % IV BOLUS (SEPSIS)
500.0000 mL | Freq: Once | INTRAVENOUS | Status: AC
  Administered 2017-03-30: 500 mL via INTRAVENOUS

## 2017-03-30 MED ORDER — GI COCKTAIL ~~LOC~~
30.0000 mL | Freq: Once | ORAL | Status: AC
  Administered 2017-03-30: 30 mL via ORAL
  Filled 2017-03-30: qty 30

## 2017-03-30 NOTE — Discharge Instructions (Signed)
This is likely a gastrointestinal illness however I feel that your antibiotics played a role in making it acutely worse. Cannot determine if she had H. pylori until you see a GI doctor. Have given you a referral. Please continue your omeprazole daily. Take the Pepcid twice a day. Uses Zofran as needed for nausea. May use the Imodium for diarrhea over-the-counter. Make sure you're staying hydrated plenty of fluids. Clear liquid diet for 24 hours and advance diet as tolerated. Return to the ED if he develops any worsening symptoms.

## 2017-03-30 NOTE — ED Triage Notes (Signed)
Pt states emesis and epigastric pain x 3 days and watery diarrhea x 2 days.

## 2017-03-30 NOTE — ED Notes (Signed)
Pt reports improvement in symptoms

## 2017-03-30 NOTE — ED Provider Notes (Signed)
[facility]-EMERGENCY DEPT Provider Note   CSN: [id number] Arrival date & time: 03/30/17  7829     History   Chief Complaint Chief Complaint  Patient presents with  . Abdominal Pain  . Emesis    HPI [name] is a 26 y.o. female.  HPI 26 year old African-American female past medical history significant for asthma and anemia presents to the ED today with complaints of nausea, emesis, diarrhea, epigastric pain. Patient states that her symptoms started approximately 3 days ago. She complains of several episodes since of nonbloody nonbilious emesis along with nonbloody watery diarrhea. Patient has not tried any further symptoms at home. She endorses mild epigastric cramping that has been intermittent since the emesis and diarrhea started. Patient states she worsen at urgent care is around sick patients all the time. She is a poor by mouth intake for the past 3 days. Last bowel movement was today consistent with diarrhea. She endorses chills. States took her temperature 3 days ago it was 99.8. No fever since. Patient also reports that at her work they did a "fingerstick blood test to test for H. pylori". States that it was positive and they started her on Bactrim and Flagyl. Do not have records of this. States that her symptoms have worsened since starting antibiotics. She denies any recent travel outside the [place], bloody diarrhea, urinary symptoms, vaginal bleeding, vaginal discharge, lightheadedness, dizziness, headache, vision changes, chest pain, shortness of breath. Past Medical History:  Diagnosis Date  . Anemia   . Asthma   . History of chlamydia   . No pertinent past medical history     There are no active problems to display for this patient.   Past Surgical History:  Procedure Laterality Date  . NO PAST SURGERIES      OB History    Gravida Para Term Preterm AB Living   SAB TAB Ectopic Multiple Live Births           1       Home Medications    Prior to  Admission medications   Medication Sig Start Date End Date Taking? Authorizing Provider  acetaminophen (TYLENOL) 500 MG tablet Take 500 mg by mouth every 6 (six) hours as needed (pain.).    Historical Provider, MD  amoxicillin (AMOXIL) 500 MG capsule Take 1 capsule (500 mg total) by mouth 3 (three) times daily. Patient not taking: Reported on 10/17/2016 08/22/16   Elvina Sidle, MD  etonogestrel (NEXPLANON) 68 MG IMPL implant 1 each by Subdermal route once.    Historical Provider, MD  fluticasone (FLONASE) 50 MCG/ACT nasal spray Place 2 sprays into both nostrils daily. 10/17/16   Deborha Payment, PA-C    Family History Family History  Problem Relation Age of Onset  . Anesthesia problems Mother   . Arthritis Mother   . Asthma Brother   . Seizures Maternal Aunt   . Anesthesia problems Maternal Aunt   . Hypertension Paternal Aunt   . Cancer Paternal Aunt     lung  . Hypertension Paternal Uncle   . Cancer Maternal Grandmother     cervix  . Seizures Maternal Grandmother   . Hypertension Maternal Grandfather   . Cancer Maternal Grandfather     prostate  . Hypertension Paternal Grandfather   . Mental retardation Cousin     cp x2    Social History Social History  Substance Use Topics  . Smoking status: Current Every Day Smoker  Packs/day: 0.25    Years: 2.00    Types: Cigarettes  . Smokeless tobacco: Never Used  . Alcohol use No     Allergies   Patient has no known allergies.   Review of Systems Review of Systems  Constitutional: Positive for chills. Negative for fever.  HENT: Negative for congestion.   Eyes: Negative for visual disturbance.  Respiratory: Negative for cough and shortness of breath.   Cardiovascular: Negative for chest pain.  Gastrointestinal: Positive for abdominal pain (epigastric). Negative for blood in stool, diarrhea, nausea and vomiting.  Genitourinary: Negative for dysuria, flank pain, frequency, hematuria, urgency, vaginal bleeding and vaginal  discharge.  Skin: Negative for color change.  Neurological: Negative for dizziness, syncope, weakness, light-headedness, numbness and headaches.     Physical Exam Updated Vital Signs BP 129/79 (BP Location: Right Arm)   Pulse (!) 105   Temp 98.1 F (36.7 C) (Oral)   Resp 16   Ht 5' 5.5" (1.664 m)   Wt 68 kg   LMP 03/07/2017   SpO2 100%   BMI 24.58 kg/m   Physical Exam  Constitutional: She is oriented to person, place, and time. She appears well-developed and well-nourished. No distress.  Patient is nontoxic appearing.  HENT:  Head: Normocephalic and atraumatic.  Mouth/Throat: Uvula is midline and oropharynx is clear and moist. Mucous membranes are dry.  Mucous membranes appear dry.  Eyes: Conjunctivae and EOM are normal. Pupils are equal, round, and reactive to light. Right eye exhibits no discharge. Left eye exhibits no discharge. No scleral icterus.  Neck: Normal range of motion. Neck supple. No thyromegaly present.  Cardiovascular: Regular rhythm, normal heart sounds and intact distal pulses.   Mild tachycardia noted.  Pulmonary/Chest: Effort normal and breath sounds normal.  Abdominal: Soft. She exhibits no distension. Bowel sounds are increased. There is tenderness in the epigastric area. There is no rebound, no guarding, no CVA tenderness, no tenderness at McBurney's point and negative Murphy's sign.  Musculoskeletal: Normal range of motion.  Lymphadenopathy:    She has no cervical adenopathy.  Neurological: She is alert and oriented to person, place, and time.  Skin: Skin is warm and dry. Capillary refill takes less than 2 seconds.  Nursing note and vitals reviewed.    ED Treatments / Results  Labs (all labs ordered are listed, but only abnormal results are displayed) Labs Reviewed  COMPREHENSIVE METABOLIC PANEL - Abnormal; Notable for the following:       Result Value   Potassium 3.4 (*)    CO2 21 (*)    ALT 12 (*)    Alkaline Phosphatase 34 (*)    All  other components within normal limits  CBC WITH DIFFERENTIAL/PLATELET - Abnormal; Notable for the following:    WBC 3.8 (*)    All other components within normal limits  URINALYSIS, ROUTINE W REFLEX MICROSCOPIC - Abnormal; Notable for the following:    APPearance CLOUDY (*)    Specific Gravity, Urine >1.030 (*)    Bilirubin Urine SMALL (*)    Ketones, ur 15 (*)    Nitrite POSITIVE (*)    Leukocytes, UA TRACE (*)    All other components within normal limits  URINALYSIS, MICROSCOPIC (REFLEX) - Abnormal; Notable for the following:    Bacteria, UA MANY (*)    Squamous Epithelial / LPF 6-30 (*)    All other components within normal limits  LIPASE, BLOOD  POC URINE PREG, ED    EKG  EKG Interpretation None  Radiology Dg Abd 2 Views  Result Date: 03/30/2017 CLINICAL DATA:  Nausea, vomiting, diarrhea and central abdominal pain over the last 3 days. EXAM: ABDOMEN - 2 VIEW COMPARISON:  None. FINDINGS: Small air-fluid levels seen throughout the colon as might be seen with diarrhea. No sign of small or large bowel obstruction. No free air. No abnormal calcifications or acute bone findings. Mild spinal curvature and lower lumbar degenerative changes. IMPRESSION: Air-fluid levels in the colon as might be seen with diarrhea. No sign of obstruction. Electronically Signed   By: Paulina Fusi M.D.   On: 03/30/2017 10:21    Procedures Procedures (including critical care time)  Medications Ordered in ED Medications  sodium chloride 0.9 % bolus 1,000 mL (not administered)  ondansetron (ZOFRAN) injection 4 mg (not administered)  morphine 4 MG/ML injection 4 mg (not administered)     Initial Impression / Assessment and Plan / ED Course  I have reviewed the triage vital signs and the nursing notes.  Pertinent labs & imaging results that were available during my care of the patient were reviewed by me and considered in my medical decision making (see chart for details).     Patient with  symptoms consistent with viral gastroenteritis.  However, pt recently started on bactrim and flagyl for possible h pylori which I believe is worsening her symptoms. Pt st she was on the bactrim for uti which she started 7 days ago and finished today. Vitals are stable, no fever.  No signs of dehydration, tolerating PO fluids > 6 oz.  Lungs are clear.  No focal abdominal pain, no concern for appendicitis, cholecystitis, pancreatitis, ruptured viscus, UTI, kidney stone, or any other abdominal etiology. Xray with no signs of obstruction.  No luekocytosis. All other labs unremarkable. UA consistent with uti. Given that the bactrim has not helped the uti will start on keflex. No CVA tenderness or fever concerning for peylo. UA cultured. Pt with belching. She was given gi cocktail with improvement in symptoms. Will d/c with zofran and pepcid along with GI follow up. She is already on Prilosec. Supportive therapy indicated. Pt is hemodynamically stable, in NAD, & able to ambulate in the ED. Pain has been managed & has no complaints prior to dc. Pt is comfortable with above plan and is stable for discharge at this time. All questions were answered prior to disposition. Strict return precautions for f/u to the ED were discussed.   Final Clinical Impressions(s) / ED Diagnoses   Final diagnoses:  Nausea, vomiting and diarrhea    New Prescriptions Discharge Medication List as of 03/30/2017  1:09 PM    START taking these medications   Details  famotidine (PEPCID) 20 MG tablet Take 1 tablet (20 mg total) by mouth 2 (two) times daily., Starting Fri 03/30/2017, Print    ondansetron (ZOFRAN) 4 MG tablet Take 1 tablet (4 mg total) by mouth every 6 (six) hours., Starting Fri 03/30/2017, Print         Rise Mu, PA-C 03/30/17 1748    Tilden Fossa, MD 03/31/17 [phone]

## 2017-03-31 LAB — URINE CULTURE

## 2018-04-07 ENCOUNTER — Inpatient Hospital Stay (HOSPITAL_COMMUNITY)
Admission: AD | Admit: 2018-04-07 | Discharge: 2018-04-07 | Disposition: A | Discharge status: Home / Self Care | Payer: BLUE CROSS/BLUE SHIELD | Source: Ambulatory Visit | Attending: Family Medicine | Admitting: Family Medicine

## 2018-04-07 ENCOUNTER — Encounter (HOSPITAL_COMMUNITY): Payer: Self-pay

## 2018-04-07 DIAGNOSIS — F17210 Nicotine dependence, cigarettes, uncomplicated: Secondary | ICD-10-CM | POA: Insufficient documentation

## 2018-04-07 DIAGNOSIS — R109 Unspecified abdominal pain: Secondary | ICD-10-CM | POA: Insufficient documentation

## 2018-04-07 DIAGNOSIS — N926 Irregular menstruation, unspecified: Secondary | ICD-10-CM

## 2018-04-07 DIAGNOSIS — Z3202 Encounter for pregnancy test, result negative: Secondary | ICD-10-CM

## 2018-04-07 HISTORY — DX: Major depressive disorder, single episode, unspecified: F32.9

## 2018-04-07 HISTORY — DX: Depression, unspecified: F32.A

## 2018-04-07 HISTORY — DX: Generalized anxiety disorder: F41.1

## 2018-04-07 LAB — WET PREP, GENITAL
CLUE CELLS WET PREP: NONE SEEN
SPERM: NONE SEEN
TRICH WET PREP: NONE SEEN
YEAST WET PREP: NONE SEEN

## 2018-04-07 LAB — URINALYSIS, ROUTINE W REFLEX MICROSCOPIC
BILIRUBIN URINE: NEGATIVE
Bacteria, UA: NONE SEEN
GLUCOSE, UA: NEGATIVE mg/dL
KETONES UR: NEGATIVE mg/dL
LEUKOCYTES UA: NEGATIVE
Nitrite: NEGATIVE
PROTEIN: NEGATIVE mg/dL
Specific Gravity, Urine: 1.021 (ref 1.005–1.030)
pH: 5 (ref 5.0–8.0)

## 2018-04-07 LAB — POCT PREGNANCY, URINE: PREG TEST UR: NEGATIVE

## 2018-04-07 LAB — HCG, QUANTITATIVE, PREGNANCY: hCG, Beta Chain, Quant, S: <1 m[IU]/mL (ref <5)

## 2018-04-07 NOTE — MAU Note (Signed)
Bleeding since this AM, 2 pos HPT 2 days ago. No pain. LMP 03/08/2018.

## 2018-04-07 NOTE — Discharge Instructions (Signed)
Dysfunctional Uterine Bleeding °Dysfunctional uterine bleeding is abnormal bleeding from the uterus. Dysfunctional uterine bleeding includes: °· A period that comes earlier or later than usual. °· A period that is lighter, heavier, or has blood clots. °· Bleeding between periods. °· Skipping one or more periods. °· Bleeding after sexual intercourse. °· Bleeding after menopause. ° °Follow these instructions at home: °Pay attention to any changes in your symptoms. Follow these instructions to help with your condition: °Eating and drinking °· Eat well-balanced meals. Include foods that are high in iron, such as liver, meat, shellfish, green leafy vegetables, and eggs. °· If you become constipated: °? Drink plenty of water. °? Eat fruits and vegetables that are high in water and fiber, such as spinach, carrots, raspberries, apples, and mango. °Medicines °· Take over-the-counter and prescription medicines only as told by your health care provider. °· Do not change medicines without talking with your health care provider. °· Aspirin or medicines that contain aspirin may make the bleeding worse. Do not take those medicines: °? During the week before your period. °? During your period. °· If you were prescribed iron pills, take them as told by your health care provider. Iron pills help to replace iron that your body loses because of this condition. °Activity °· If you need to change your sanitary pad or tampon more than one time every 2 hours: °? Lie in bed with your feet raised (elevated). °? Place a cold pack on your lower abdomen. °? Rest as much as possible until the bleeding stops or slows down. °· Do not try to lose weight until the bleeding has stopped and your blood iron level is back to normal. °Other Instructions °· For two months, write down: °? When your period starts. °? When your period ends. °? When any abnormal bleeding occurs. °? What problems you notice. °· Keep all follow up visits as told by your health  care provider. This is important. °Contact a health care provider if: °· You get light-headed or weak. °· You have nausea and vomiting. °· You cannot eat or drink without vomiting. °· You feel dizzy or have diarrhea while you are taking medicines. °· You are taking birth control pills or hormones, and you want to change them or stop taking them. °Get help right away if: °· You develop a fever or chills. °· You need to change your sanitary pad or tampon more than one time per hour. °· Your bleeding becomes heavier, or your flow contains clots more often. °· You develop pain in your abdomen. °· You lose consciousness. °· You develop a rash. °This information is not intended to replace advice given to you by your health care provider. Make sure you discuss any questions you have with your health care provider. °Document Released: 12/01/2000 Document Revised: 05/11/2016 Document Reviewed: 03/01/2015 °Elsevier Interactive Patient Education © 2018 [employer] ° °

## 2018-04-07 NOTE — MAU Provider Note (Signed)
Patient [name] is a 27 y.o. G2P1011 At Unknown gestation here with complaints of vaginal bleeding. She took a home pregnancy test three days ago and it was positive. She denies abdominal pain, abnormal discharge, dysuria, or other ob-gyn complaint.   She has felt nauseated off and on for a week, which is why she took her pregnancy test. SHe has had irregular periods since March (LMP was 3/22); before [facility] she was in taking OCP.  History     CSN: [id number]  Arrival date and time: 04/07/18 1329   First Provider Initiated Contact with Patient 04/07/18 1417      Chief Complaint  Patient presents with  . Vaginal Bleeding   Vaginal Bleeding  The patient's primary symptoms include vaginal bleeding. The patient's pertinent negatives include no genital itching, genital lesions, genital odor, missed menses or vaginal discharge. This is a new problem. The current episode started today. The problem occurs intermittently. The problem has been unchanged. The patient is experiencing no pain. Pertinent negatives include no abdominal pain, diarrhea, sore throat, urgency or vomiting. The vaginal discharge was bloody. The vaginal bleeding is typical of menses. She has not been passing clots.    OB History    Gravida  2   Para  1   Term  1   Preterm      AB  1   Living  1     SAB      TAB  1   Ectopic      Multiple      Live Births  1           Past Medical History:  Diagnosis Date  . Anemia   . Asthma   . Depression   . Generalized anxiety disorder   . History of chlamydia   . No pertinent past medical history     Past Surgical History:  Procedure Laterality Date  . DILATION AND CURETTAGE OF UTERUS    . NO PAST SURGERIES      Family History  Problem Relation Age of Onset  . Anesthesia problems Mother   . Arthritis Mother   . Asthma Brother   . Seizures Maternal Aunt   . Anesthesia problems Maternal Aunt   . Hypertension Paternal Aunt   . Cancer  Paternal Aunt        lung  . Hypertension Paternal Uncle   . Cancer Maternal Grandmother        cervix  . Seizures Maternal Grandmother   . Hypertension Maternal Grandfather   . Cancer Maternal Grandfather        prostate  . Hypertension Paternal Grandfather   . Mental retardation Cousin        cp x2    Social History   Tobacco Use  . Smoking status: Current Every Day Smoker    Packs/day: 0.25    Years: 2.00    Pack years: 0.50    Types: Cigarettes  . Smokeless tobacco: Never Used  Substance Use Topics  . Alcohol use: No  . Drug use: No    Allergies: No Known Allergies  Medications Prior to Admission  Medication Sig Dispense Refill Last Dose  . albuterol (PROVENTIL HFA;VENTOLIN HFA) 108 (90 Base) MCG/ACT inhaler Inhale 1-2 puffs into the lungs every 6 (six) hours as needed for wheezing or shortness of breath.   Past Month at Unknown time  . fluticasone furoate-vilanterol (BREO ELLIPTA) 100-25 MCG/INH AEPB Inhale 2 puffs into the lungs daily.  Past Month at Unknown time    Review of Systems  HENT: Negative for sore throat.   Gastrointestinal: Negative for abdominal pain, diarrhea and vomiting.  Genitourinary: Positive for vaginal bleeding. Negative for missed menses, urgency and vaginal discharge.   Physical Exam   Blood pressure 129/87, pulse 88, temperature 98.4 F (36.9 C), temperature source Oral, resp. rate 18, height 5' 5.5" (1.664 m), weight 165 lb (74.8 kg), last menstrual period 03/08/2018.  Physical Exam  Constitutional: She appears well-developed.  HENT:  Head: Normocephalic.  Eyes: Pupils are equal, round, and reactive to light.  Neck: Normal range of motion.  GI: Soft.  Genitourinary:  Genitourinary Comments: Normal external female genitalia; moderate amount of dark brown blood in the vagina. No odor. No clots; no CMT, suprapubic or adnexal tenderness.   Musculoskeletal: Normal range of motion.  Neurological: She is alert.  Skin: Skin is warm and  dry.    MAU Course  Procedures  MDM -beta is less than 1.   Advised patient that she is not pregnant; Her bleeding is most likely menses.  Assessment and Plan   1. Menses, irregular   2. Abdominal pain   3. Pregnancy test negative    2. Patient stable for discharge with recommendations to make a follow up appt at her ob-gyn if she wants to get on birth control.  3. Bleeding precautions reviewed; all questions answered.   Charlesetta Garibaldi Kadon Andrus 04/07/2018, 2:36 PM

## 2018-04-08 LAB — GC/CHLAMYDIA PROBE AMP (~~LOC~~) NOT AT ARMC
Chlamydia: NEGATIVE
NEISSERIA GONORRHEA: NEGATIVE

## 2018-10-04 ENCOUNTER — Inpatient Hospital Stay (HOSPITAL_COMMUNITY)
Admission: AD | Admit: 2018-10-04 | Discharge: 2018-10-04 | Disposition: A | Discharge status: Home / Self Care | Payer: Self-pay | Source: Ambulatory Visit | Attending: Obstetrics and Gynecology | Admitting: Obstetrics and Gynecology

## 2018-10-04 ENCOUNTER — Encounter (HOSPITAL_COMMUNITY): Payer: Self-pay

## 2018-10-04 ENCOUNTER — Inpatient Hospital Stay (HOSPITAL_COMMUNITY): Payer: Self-pay

## 2018-10-04 ENCOUNTER — Other Ambulatory Visit: Payer: Self-pay

## 2018-10-04 DIAGNOSIS — O209 Hemorrhage in early pregnancy, unspecified: Secondary | ICD-10-CM

## 2018-10-04 DIAGNOSIS — Z3201 Encounter for pregnancy test, result positive: Secondary | ICD-10-CM | POA: Insufficient documentation

## 2018-10-04 DIAGNOSIS — N939 Abnormal uterine and vaginal bleeding, unspecified: Secondary | ICD-10-CM | POA: Insufficient documentation

## 2018-10-04 LAB — WET PREP, GENITAL
Clue Cells Wet Prep HPF POC: NONE SEEN
Sperm: NONE SEEN
Trich, Wet Prep: NONE SEEN
Yeast Wet Prep HPF POC: NONE SEEN

## 2018-10-04 LAB — POCT PREGNANCY, URINE
Preg Test, Ur: POSITIVE — AB
Preg Test, Ur: POSITIVE — AB

## 2018-10-04 LAB — ABO/RH: ABO/RH(D): A POS

## 2018-10-04 LAB — URINALYSIS, ROUTINE W REFLEX MICROSCOPIC
Bilirubin Urine: NEGATIVE
GLUCOSE, UA: NEGATIVE mg/dL
Ketones, ur: NEGATIVE mg/dL
NITRITE: NEGATIVE
PROTEIN: 30 mg/dL — AB
SPECIFIC GRAVITY, URINE: 1.019 (ref 1.005–1.030)
pH: 5 (ref 5.0–8.0)

## 2018-10-04 LAB — CBC
HCT: 39.6 % (ref 36.0–46.0)
HEMOGLOBIN: 13.3 g/dL (ref 12.0–15.0)
MCH: 31.4 pg (ref 26.0–34.0)
MCHC: 33.6 g/dL (ref 30.0–36.0)
MCV: 93.4 fL (ref 80.0–100.0)
NRBC: 0 % (ref 0.0–0.2)
PLATELETS: 200 10*3/uL (ref 150–400)
RBC: 4.24 MIL/uL (ref 3.87–5.11)
RDW: 13 % (ref 11.5–15.5)
WBC: 5.5 10*3/uL (ref 4.0–10.5)

## 2018-10-04 LAB — HCG, QUANTITATIVE, PREGNANCY: hCG, Beta Chain, Quant, S: 203 m[IU]/mL — ABNORMAL HIGH (ref <5)

## 2018-10-04 NOTE — MAU Note (Signed)
Pt states spotting began around 0900 this morning.  Pt states she only sees it when she wipes, not having to wear a pad.  Pt reports some mild lower back pain that she rates 1/10.  Pt. Denies abd pain

## 2018-10-04 NOTE — Discharge Instructions (Signed)
Human Chorionic Gonadotropin Test °Human chorionic gonadotropin (hCG) is a hormone produced during pregnancy by the cells that form the placenta. The placenta is the organ that grows inside your womb (uterus) to nourish a developing baby. When you are pregnant, hCG starts to appear in your blood about 11 days after conception. It continues to go up for the first 8-11 weeks of pregnancy. °Your hCG level can be measured with several different types of tests. You may have: °· A urine test. °? hCG is eliminated from your body by your kidneys, so a urine test is one way to check for this hormone. °? A urine test only shows whether there is hCG in your urine. It does not measure how much. °? You may have a urine test to find out whether you are pregnant. °? A home pregnancy test detects whether there is hCG in your urine. °· A qualitative blood test. °? Like the urine test, this blood test only shows whether there is hCG in your blood. It does not measure how much. °? You may have this type of blood test to find out whether you are pregnant. °· A quantitative blood test. °? This type of blood test measures the amount of hCG in your blood. °? You may have this type of test to diagnose an abnormal pregnancy or determine whether you are at risk of, or have had, a failed pregnancy (miscarriage). ° °How do I prepare for this test? °For the urine test: °· Limit your fluid intake before the urine test as directed by your health care provider. °· Collect the sample the first time you urinate in the morning. °· Let your health care provider know if you have blood in your urine. This may interfere with the test result. ° °Some medicines may interfere with the urine and blood tests. Let your health care provider know about all the medicines you are taking. No additional preparation is required for the blood test. °What do the results mean? °It is your responsibility to obtain your test results. Ask the lab or department performing  the test when and how you will get your results. Talk to your health care provider if you have any questions about your test results. °The results of the hCG urine test and the qualitative hCG blood test are either positive or negative. The results of the quantitative hCG blood test are reported as a number. hCG is measured in international units per liter (IU/L). °Meaning of Negative Test Results °A negative result on a urine or qualitative blood test could mean that you are not pregnant. It could also mean the test was done too early to detect hCG. If you still have other signs of pregnancy, the test should be repeated. °Meaning of Positive Test Results °A positive result on the urine or qualitative blood tests means you are most likely pregnant. Your health care provider may confirm your pregnancy with an imaging study of the inside of your uterus at 5-6 weeks (ultrasound). °Range of Normal Values °Ranges for normal values for the quantitative hCG blood test may vary among different labs and hospitals. You should always check with your health care provider after having lab work or other tests done to discuss whether your values are considered within normal limits. °· Less than 5 IU/L means it is most likely you are not pregnant. °· Greater than 25 IU/L means it is most likely you are pregnant. ° °Meaning of Results Outside Normal Value Ranges °If your hCG   level on the quantitative test is not what would be expected, you may have the test again. It may also be important for your health care provider to know whether your hCG level goes up or down over time. Common causes of results outside the normal range include: °· Being pregnant with twins (hCG level is higher than expected). °· Having an ectopic pregnancy (hCG rises more slowly than expected). °· Miscarriage (hCG level falls). °· Abnormal growths in the womb (hCG level is higher than expected). ° °Talk with your health care provider to discuss your results,  treatment options, and if necessary, the need for more tests. Talk with your health care provider if you have any questions about your results. °This information is not intended to replace advice given to you by your health care provider. Make sure you discuss any questions you have with your health care provider. °Document Released: 01/05/2005 Document Revised: 08/09/2016 Document Reviewed: 03/10/2014 °Elsevier Interactive Patient Education © 2018 [employer] ° °

## 2018-10-04 NOTE — MAU Provider Note (Signed)
History     CSN: [id number]  Arrival date and time: 10/04/18 4098   First Provider Initiated Contact with Patient 10/04/18 1007      Chief Complaint  Patient presents with  . Vaginal Bleeding  . Back Pain  . Abdominal Pain   HPI  [name]  Is a 27 y.o. G3P1011 at 5w GA by LMP who presents to MAU with chief complaint of vaginal bleeding. She endorses seeing a spot of bright red bleeding after voiding this morning and again in the specimen cup given to her when she presented to MAU. Denies abnormal vaginal discharge, urinary symptoms, flank pain, fever, falls. Most recent sexual intercourse five days ago.  OB History    Gravida  3   Para  1   Term  1   Preterm      AB  1   Living  1     SAB      TAB  1   Ectopic      Multiple      Live Births  1           Past Medical History:  Diagnosis Date  . Anemia   . Asthma   . Depression   . Generalized anxiety disorder   . History of chlamydia   . No pertinent past medical history     Past Surgical History:  Procedure Laterality Date  . DILATION AND CURETTAGE OF UTERUS    . NO PAST SURGERIES      Family History  Problem Relation Age of Onset  . Anesthesia problems Mother   . Arthritis Mother   . Asthma Brother   . Seizures Maternal Aunt   . Anesthesia problems Maternal Aunt   . Hypertension Paternal Aunt   . Cancer Paternal Aunt        lung  . Hypertension Paternal Uncle   . Cancer Maternal Grandmother        cervix  . Seizures Maternal Grandmother   . Hypertension Maternal Grandfather   . Cancer Maternal Grandfather        prostate  . Hypertension Paternal Grandfather   . Mental retardation Cousin        cp x2    Social History   Tobacco Use  . Smoking status: Current Every Day Smoker    Packs/day: 0.25    Years: 2.00    Pack years: 0.50    Types: Cigarettes  . Smokeless tobacco: Never Used  Substance Use Topics  . Alcohol use: No  . Drug use: No    Allergies: No Known  Allergies  Medications Prior to Admission  Medication Sig Dispense Refill Last Dose  . albuterol (PROVENTIL HFA;VENTOLIN HFA) 108 (90 Base) MCG/ACT inhaler Inhale 1-2 puffs into the lungs every 6 (six) hours as needed for wheezing or shortness of breath.   Past Month at Unknown time  . fluticasone furoate-vilanterol (BREO ELLIPTA) 100-25 MCG/INH AEPB Inhale 2 puffs into the lungs daily.   Past Month at Unknown time    Review of Systems  Constitutional: Negative for fatigue.  Gastrointestinal: Negative for abdominal pain, nausea and vomiting.  Genitourinary: Positive for vaginal bleeding. Negative for difficulty urinating, flank pain, vaginal discharge and vaginal pain.  Musculoskeletal: Negative for back pain.  All other systems reviewed and are negative.  Physical Exam   Blood pressure 125/81, pulse 100, temperature 98 F (36.7 C), resp. rate 20, height 5' 5.5" (1.664 m), last menstrual period 08/30/2018, SpO2 100 %.  Physical Exam  Nursing note and vitals reviewed. Constitutional: She is oriented to person, place, and time. She appears well-developed and well-nourished.  Cardiovascular: Normal rate and intact distal pulses.  Respiratory: Effort normal. No respiratory distress.  GI: Soft. Normal appearance. She exhibits no distension. There is no tenderness. There is no rebound, no guarding and no CVA tenderness.  Genitourinary: Vaginal discharge found.  Genitourinary Comments: Pale pink bleeding visualized on specimen swabs  Neurological: She is alert and oriented to person, place, and time. She has normal reflexes.  Skin: Skin is warm and dry.  Psychiatric: She has a normal mood and affect. Her behavior is normal. Judgment and thought content normal.    MAU Course  Procedures  MDM  --Discussed with patient that miscarriage cannot be confirmed with data/imaging collected today. She reports certain LMP but acknowledges reliance on app data and lengthy periods which may or may  not includes multiple days of spotting at beginning and end.  Patient Vitals for the past 24 hrs:  BP Temp Pulse Resp SpO2 Height  10/04/18 1118 130/76 - - 20 - -  10/04/18 0922 125/81 98 F (36.7 C) 100 20 100 % 5' 5.5" (1.664 m)    Results for orders placed or performed during the hospital encounter of 10/04/18 (from the past 24 hour(s))  CBC     Status: None   Collection Time: 10/04/18  9:31 AM  Result Value Ref Range   WBC 5.5 4.0 - 10.5 K/uL   RBC 4.24 3.87 - 5.11 MIL/uL   Hemoglobin 13.3 12.0 - 15.0 g/dL   HCT 16.1 09.6 - 04.5 %   MCV 93.4 80.0 - 100.0 fL   MCH 31.4 26.0 - 34.0 pg   MCHC 33.6 30.0 - 36.0 g/dL   RDW 40.9 81.1 - 91.4 %   Platelets 200 150 - 400 K/uL   nRBC 0.0 0.0 - 0.2 %  ABO/Rh     Status: None   Collection Time: 10/04/18  9:31 AM  Result Value Ref Range   ABO/RH(D)      A POS Performed at [facility], [address], [place], [place] [address]   hCG, quantitative, pregnancy     Status: Abnormal   Collection Time: 10/04/18  9:31 AM  Result Value Ref Range   hCG, Beta Chain, Quant, S 203 (H) <5 mIU/mL  Urinalysis, Routine w reflex microscopic     Status: Abnormal   Collection Time: 10/04/18  9:40 AM  Result Value Ref Range   Color, Urine AMBER (A) YELLOW   APPearance CLOUDY (A) CLEAR   Specific Gravity, Urine 1.019 1.005 - 1.030   pH 5.0 5.0 - 8.0   Glucose, UA NEGATIVE NEGATIVE mg/dL   Hgb urine dipstick LARGE (A) NEGATIVE   Bilirubin Urine NEGATIVE NEGATIVE   Ketones, ur NEGATIVE NEGATIVE mg/dL   Protein, ur 30 (A) NEGATIVE mg/dL   Nitrite NEGATIVE NEGATIVE   Leukocytes, UA SMALL (A) NEGATIVE   RBC / HPF 21-50 0 - 5 RBC/hpf   WBC, UA 21-50 0 - 5 WBC/hpf   Bacteria, UA FEW (A) NONE SEEN   Squamous Epithelial / LPF 21-50 0 - 5   Mucus PRESENT   Pregnancy, urine POC     Status: Abnormal   Collection Time: 10/04/18  9:44 AM  Result Value Ref Range   Preg Test, Ur POSITIVE (A) NEGATIVE  Pregnancy, urine POC     Status: Abnormal    Collection Time: 10/04/18  9:45 AM  Result  Value Ref Range   Preg Test, Ur POSITIVE (A) NEGATIVE  Wet prep, genital     Status: Abnormal   Collection Time: 10/04/18 10:18 AM  Result Value Ref Range   Yeast Wet Prep HPF POC NONE SEEN NONE SEEN   Trich, Wet Prep NONE SEEN NONE SEEN   Clue Cells Wet Prep HPF POC NONE SEEN NONE SEEN   WBC, Wet Prep HPF POC MANY (A) NONE SEEN   Sperm NONE SEEN    US Ob Less Than 14 Weeks With Ob Transvaginal  Result Date: 10/04/2018 CLINICAL DATA:  Bleeding EXAM: OBSTETRIC <14 WK [place] AND TRANSVAGINAL OB US TECHNIQUE: Both transabdominal and transvaginal ultrasound examinations were performed for complete evaluation of the gestation as well as the maternal uterus, adnexal regions, and pelvic cul-de-sac. Transvaginal technique was performed to assess early pregnancy. COMPARISON:  None. FINDINGS: Intrauterine gestational sac: None Yolk sac:  Not visualized Embryo:  Not visualized Cardiac Activity: Not visualized Heart Rate:   bpm MSD:   mm    w     d CRL:    mm    w    d                  [place] EDC: Subchorionic hemorrhage:  None visualized. Maternal uterus/adnexae: No adnexal mass or free fluid. IMPRESSION: No intrauterine pregnancy visualized. Differential considerations would include early intrauterine pregnancy too early to visualize, spontaneous abortion, or occult ectopic pregnancy. Recommend close clinical followup and serial quantitative beta HCGs and ultrasounds. Electronically Signed   By: Charlett Nose M.D.   On: 10/04/2018 10:45    Assessment and Plan  --27 y.o. G3P1011  --Positive pregnancy test, no sign of IUP on ultrasound --Discharge home in stable condition, work note provided  F/U: Patient to return to MAU in 48 hours for repeat Quant hCG  Calvert Cantor, CNM 10/04/2018, 12:20 PM

## 2018-10-05 LAB — CULTURE, OB URINE: Culture: <10000 — AB

## 2018-10-05 LAB — HIV ANTIBODY (ROUTINE TESTING W REFLEX): HIV Screen 4th Generation wRfx: NONREACTIVE

## 2018-10-06 ENCOUNTER — Encounter (HOSPITAL_COMMUNITY): Payer: Self-pay | Admitting: Student

## 2018-10-06 ENCOUNTER — Inpatient Hospital Stay (HOSPITAL_COMMUNITY)
Admission: AD | Admit: 2018-10-06 | Discharge: 2018-10-06 | Disposition: A | Discharge status: Home / Self Care | Payer: Medicaid Other | Source: Ambulatory Visit | Attending: Obstetrics & Gynecology | Admitting: Obstetrics & Gynecology

## 2018-10-06 DIAGNOSIS — O039 Complete or unspecified spontaneous abortion without complication: Secondary | ICD-10-CM

## 2018-10-06 LAB — HCG, QUANTITATIVE, PREGNANCY: HCG, BETA CHAIN, QUANT, S: 47 m[IU]/mL — AB (ref <5)

## 2018-10-06 NOTE — MAU Provider Note (Signed)
Ms. [name]  is a 27 y.o. G3P1011  at Unknown who presents to MAU today for follow-up quant hCG. The patient denies abdominal pain, vaginal bleeding, N/V or fever.   BP 134/85 (BP Location: Right Arm)   Pulse 82   Temp 98.4 F (36.9 C) (Oral)   Resp 18   Wt 76 kg   LMP 08/30/2018 (Exact Date)   BMI 27.45 kg/m   GENERAL: Well-developed, well-nourished female in no acute distress.  HEENT: Normocephalic, atraumatic.   LUNGS: Effort normal HEART: Regular rate  SKIN: Warm, dry and without erythema PSYCH: Normal mood and affect   A: 1. Miscarriage   - RH positive  Component     Latest Ref Rng & Units 10/04/2018 10/06/2018  HCG, Beta Chain, Quant, S     <5 mIU/mL 203 (H) 47 (H)     P: Discharge home Msg to Clinic for SAB f/u Discussed reasons to return to MAU Pelvic rest until f/u  Judeth Horn, NP  10/06/2018 5:02 PM

## 2018-10-06 NOTE — MAU Note (Signed)
Here for repeat HCG  No pain or bleeding today

## 2018-10-06 NOTE — Discharge Instructions (Signed)

## 2018-10-07 LAB — GC/CHLAMYDIA PROBE AMP (~~LOC~~) NOT AT ARMC
Chlamydia: NEGATIVE
Neisseria Gonorrhea: NEGATIVE

## 2018-10-10 ENCOUNTER — Telehealth: Payer: Self-pay | Admitting: *Deleted

## 2018-10-10 NOTE — Telephone Encounter (Signed)
Pt called reporting that on Sunday she was diagnosed with an SAB.  Pt reports that at the time of the diagnosis she was not bleeding but has recently started heavy bleeding with small clots.  Pt also reports intense cramping and back pain.  Pt verbalized concern about the bleeding and the pain.  Informed pt that this is part of the normal process of having a miscarriage and that the heavy bleeding should only last a few days and then it will resemble a normal period for a few days and then stop.  Pt advised to take motrin and use heat to help with the cramps and back pain.  Pt advised to call the office if she has bleeding that is so heavy that she is feeling lightheaded or if her she is still having heavy bleeding next week.  Pt verbalized understanding.

## 2018-10-11 ENCOUNTER — Other Ambulatory Visit: Payer: Self-pay | Admitting: *Deleted

## 2018-10-11 DIAGNOSIS — O039 Complete or unspecified spontaneous abortion without complication: Secondary | ICD-10-CM

## 2018-10-14 ENCOUNTER — Other Ambulatory Visit: Payer: Medicaid Other

## 2018-10-16 ENCOUNTER — Other Ambulatory Visit: Payer: Medicaid Other

## 2018-10-16 DIAGNOSIS — O039 Complete or unspecified spontaneous abortion without complication: Secondary | ICD-10-CM

## 2018-10-17 ENCOUNTER — Telehealth: Payer: Self-pay | Admitting: *Deleted

## 2018-10-17 LAB — BETA HCG QUANT (REF LAB)

## 2018-10-17 NOTE — Telephone Encounter (Signed)
Pt called and requested results from [facility] yesterday. I informed her that the hormone level has returned to the fully non-pregnant state. No additional tests of the hormone level are indicated. Pt confirmed she desires a pregnancy at this time. She asked if she is still supposed to wait to get pregnant until after having a period. I stated that is the recommendation. She may discuss further at her scheduled visit on 11/4. Pt voiced understanding of all information and instructions given.

## 2018-10-21 ENCOUNTER — Encounter: Payer: Medicaid Other | Admitting: Family Medicine

## 2018-10-30 ENCOUNTER — Encounter: Payer: Medicaid Other | Admitting: Advanced Practice Midwife

## 2018-11-20 ENCOUNTER — Ambulatory Visit: Payer: Medicaid Other | Admitting: Nurse Practitioner

## 2018-12-05 ENCOUNTER — Telehealth: Payer: Self-pay | Admitting: *Deleted

## 2018-12-05 NOTE — Telephone Encounter (Signed)
Pt called office today stating that she has performed home UPT which is weakly positive. She is desiring appt for confirmation of pregnancy. Her next period is not due until 12/24. She is anxious because she had recent miscarriage. She denies vaginal bleeding or cramping @ this time. Pt was advised that it is best to perform UPT at least 1 Alleigh Mollica after missed period. She was given appt for 12/26 @ 1740.  Pt agreed and voiced understanding of information given.

## 2018-12-12 ENCOUNTER — Encounter: Payer: Self-pay | Admitting: Family Medicine

## 2018-12-12 ENCOUNTER — Ambulatory Visit (INDEPENDENT_AMBULATORY_CARE_PROVIDER_SITE_OTHER): Payer: Medicaid Other

## 2018-12-12 DIAGNOSIS — Z3201 Encounter for pregnancy test, result positive: Secondary | ICD-10-CM

## 2018-12-12 NOTE — Progress Notes (Signed)
Chart reviewed for nurse visit. Agree with plan of care.   Marylene LandKooistra, Kathryn Lorraine, CNM 12/12/2018 7:53 PM

## 2018-12-12 NOTE — Progress Notes (Signed)
Pt here today for pregnancy test.  Resulted positive.  Pt informed me that her LMP 11/07/18  EDD 08/14/19 5w 0d today.  Medications reconciled.  Proof of pregnancy letter provided by the front office to start prenatal care.

## 2018-12-13 LAB — POCT PREGNANCY, URINE: Preg Test, Ur: POSITIVE — AB

## 2018-12-18 NOTE — L&D Delivery Note (Signed)
Delivery Note Pt complete with urge to push. She pushed 2-3 times and at 8:17 AM a viable female was delivered via Vaginal, Spontaneous (Presentation: OA;  ).  APGAR:8,9  weight pending .   Placenta status: delivered, intact, shultz, .  Cord: 3vc with the following complications:none .  Cord pH: 3vc  Anesthesia: Epidural Episiotomy: None Lacerations: None Suture Repair: none Est. Blood Loss (mL):  42ml  Mom to postpartum.  Baby to Couplet care / Skin to Skin  Desires circumcision in office.  Isaiah Serge 09/06/2019, 8:30 AM

## 2019-01-16 ENCOUNTER — Telehealth: Payer: Self-pay | Admitting: Obstetrics and Gynecology

## 2019-01-16 ENCOUNTER — Encounter: Payer: Medicaid Other | Admitting: Obstetrics and Gynecology

## 2019-01-16 NOTE — Telephone Encounter (Signed)
Called the patient to reschedule the appointment due to no/show. Patient stated she would not like to reschedule at this time

## 2019-01-16 NOTE — Progress Notes (Deleted)
Patient did not keep her New OB appointment today She will be called to be rescheduled  Alexah Kivett, Harolyn Rutherford, NP 01/16/2019 4:03 PM

## 2019-02-17 LAB — OB RESULTS CONSOLE GBS: GBS: POSITIVE

## 2019-02-17 LAB — OB RESULTS CONSOLE GC/CHLAMYDIA
Chlamydia: NEGATIVE
Gonorrhea: NEGATIVE

## 2019-02-17 LAB — OB RESULTS CONSOLE ABO/RH: RH Type: POSITIVE

## 2019-02-17 LAB — OB RESULTS CONSOLE HEPATITIS B SURFACE ANTIGEN: Hepatitis B Surface Ag: NEGATIVE

## 2019-02-17 LAB — OB RESULTS CONSOLE ANTIBODY SCREEN: Antibody Screen: NEGATIVE

## 2019-02-17 LAB — OB RESULTS CONSOLE RUBELLA ANTIBODY, IGM: Rubella: IMMUNE

## 2019-02-17 LAB — OB RESULTS CONSOLE HIV ANTIBODY (ROUTINE TESTING): HIV: NONREACTIVE

## 2019-02-17 LAB — OB RESULTS CONSOLE RPR: RPR: NONREACTIVE

## 2019-03-17 ENCOUNTER — Encounter (HOSPITAL_COMMUNITY): Payer: Self-pay

## 2019-04-25 ENCOUNTER — Encounter (HOSPITAL_COMMUNITY): Payer: Self-pay

## 2019-04-28 ENCOUNTER — Other Ambulatory Visit (HOSPITAL_COMMUNITY): Payer: Self-pay | Admitting: Obstetrics and Gynecology

## 2019-04-28 DIAGNOSIS — Q632 Ectopic kidney: Secondary | ICD-10-CM

## 2019-04-28 DIAGNOSIS — Z8759 Personal history of other complications of pregnancy, childbirth and the puerperium: Secondary | ICD-10-CM

## 2019-04-28 DIAGNOSIS — Z3A20 20 weeks gestation of pregnancy: Secondary | ICD-10-CM

## 2019-05-01 ENCOUNTER — Encounter (HOSPITAL_COMMUNITY): Payer: Self-pay

## 2019-05-05 ENCOUNTER — Encounter (HOSPITAL_COMMUNITY): Payer: Self-pay

## 2019-05-06 ENCOUNTER — Ambulatory Visit (HOSPITAL_COMMUNITY): Payer: Medicaid Other

## 2019-05-06 ENCOUNTER — Ambulatory Visit (HOSPITAL_COMMUNITY): Admission: RE | Admit: 2019-05-06 | Payer: Medicaid Other | Source: Ambulatory Visit

## 2019-05-06 HISTORY — DX: Gastro-esophageal reflux disease without esophagitis: K21.9

## 2019-05-08 ENCOUNTER — Ambulatory Visit (HOSPITAL_COMMUNITY)
Admission: RE | Admit: 2019-05-08 | Discharge: 2019-05-08 | Disposition: A | Discharge status: Home / Self Care | Payer: PRIVATE HEALTH INSURANCE | Source: Ambulatory Visit | Attending: Obstetrics and Gynecology | Admitting: Obstetrics and Gynecology

## 2019-05-08 ENCOUNTER — Ambulatory Visit (HOSPITAL_COMMUNITY): Payer: PRIVATE HEALTH INSURANCE

## 2019-05-08 ENCOUNTER — Other Ambulatory Visit (HOSPITAL_COMMUNITY): Payer: Self-pay | Admitting: *Deleted

## 2019-05-08 ENCOUNTER — Other Ambulatory Visit: Payer: Self-pay

## 2019-05-08 ENCOUNTER — Encounter (HOSPITAL_COMMUNITY): Payer: Self-pay

## 2019-05-08 ENCOUNTER — Ambulatory Visit (HOSPITAL_COMMUNITY): Payer: PRIVATE HEALTH INSURANCE | Admitting: *Deleted

## 2019-05-08 VITALS — Temp 98.2°F

## 2019-05-08 DIAGNOSIS — O359XX0 Maternal care for (suspected) fetal abnormality and damage, unspecified, not applicable or unspecified: Secondary | ICD-10-CM

## 2019-05-08 DIAGNOSIS — Q632 Ectopic kidney: Secondary | ICD-10-CM | POA: Diagnosis present

## 2019-05-08 DIAGNOSIS — N96 Recurrent pregnancy loss: Secondary | ICD-10-CM | POA: Diagnosis present

## 2019-05-08 DIAGNOSIS — Z8759 Personal history of other complications of pregnancy, childbirth and the puerperium: Secondary | ICD-10-CM

## 2019-05-08 DIAGNOSIS — Z3A20 20 weeks gestation of pregnancy: Secondary | ICD-10-CM | POA: Diagnosis present

## 2019-05-08 DIAGNOSIS — O09299 Supervision of pregnancy with other poor reproductive or obstetric history, unspecified trimester: Secondary | ICD-10-CM

## 2019-05-08 NOTE — Consult Note (Signed)
Maternal-Fetal Medicine    Name: [name] MRN: [id number] Requesting Provider: Sherian Rein  I had the pleasure of seeing [name] today at [facility]. She is here for fetal anatomy scan and consultation. 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  Patient reports she had first-trimester screening that showed low risk for fetal aneuploidies.  PMH: No history of diabetes or hypertension or any other chronic medical conditions. She reports she does not have sickle-cell trait. PSH: Nil of note. Medications: Prenatal vitamins, aspirin. Allergies: NKDA. Social: Denies tobacco or drug or alcohol use in pregnancy. She quit smoking in this pregnancy. Her partner is Tree surgeon (different partner) and he is in good health. Family: No history of venous thromboembolism. Gyn history: No history of abnormal Pap smears or cervical surgeries. Obstetric history: -2013: Term delivery of a female infant weighing 7-5 at birth. Labor was induced. Her daughter is in good health. In addition, she had 2 early SABs (reason for low-dose aspirin prophylaxis).  Ultrasound: We performed a fetal anatomy scan. No markers of aneuploidies are seen. 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 No increased echogenicity is seen. Right kidney is in normal location and appears normal. No other fetal structural defects are seen. Cardiac anatomy appears normal.  Fetal biometry is consistent with her previously-established dates. Amniotic fluid is normal and good fetal activity is seen. Patient understands the limitations of ultrasound in detecting fetal anomalies.   I counseled the patient on the following: Pelvic kidney: It refers to an abnormally-located kidney. It can be associated with other anomalies. I also reassured the patient that rest of the fetal anatomy appears normal. 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 I reassured her that in the presence of normal-functioning contralateral kidney, no early delivery is indicated. Also informed her that vaginal delivery is not contraindicated.  Although the ultrasound appearance is suggestive of ectopic renal kidney (pelvic), it should be confirmed in later gestation. Fetal kidneys are better seen in the third trimester (perinephric fat) and I have made an appointment for her to return in 8 weeks for reassessment.  In the absence of associated anomalies, the risk of chromosomal anomaly is very rare.  Thank you for your consult. Please do not hesitate to contact me if you have any questions or concerns.   Consultation including face-to-face counseling: 40 min.

## 2019-07-03 ENCOUNTER — Other Ambulatory Visit (HOSPITAL_COMMUNITY): Payer: Self-pay | Admitting: *Deleted

## 2019-07-03 ENCOUNTER — Ambulatory Visit (HOSPITAL_COMMUNITY): Payer: Medicaid Other | Admitting: *Deleted

## 2019-07-03 ENCOUNTER — Encounter (HOSPITAL_COMMUNITY): Payer: Self-pay

## 2019-07-03 ENCOUNTER — Other Ambulatory Visit: Payer: Self-pay

## 2019-07-03 ENCOUNTER — Ambulatory Visit (HOSPITAL_COMMUNITY)
Admission: RE | Admit: 2019-07-03 | Discharge: 2019-07-03 | Disposition: A | Discharge status: Home / Self Care | Payer: Medicaid Other | Source: Ambulatory Visit | Attending: Obstetrics and Gynecology | Admitting: Obstetrics and Gynecology

## 2019-07-03 VITALS — BP 105/68 | HR 84 | Temp 98.6°F

## 2019-07-03 DIAGNOSIS — Q632 Ectopic kidney: Secondary | ICD-10-CM | POA: Diagnosis present

## 2019-07-03 DIAGNOSIS — Z3A28 28 weeks gestation of pregnancy: Secondary | ICD-10-CM

## 2019-07-03 DIAGNOSIS — O09293 Supervision of pregnancy with other poor reproductive or obstetric history, third trimester: Secondary | ICD-10-CM | POA: Diagnosis not present

## 2019-07-03 DIAGNOSIS — O359XX0 Maternal care for (suspected) fetal abnormality and damage, unspecified, not applicable or unspecified: Secondary | ICD-10-CM | POA: Diagnosis present

## 2019-07-03 DIAGNOSIS — Z362 Encounter for other antenatal screening follow-up: Secondary | ICD-10-CM | POA: Diagnosis not present

## 2019-07-27 ENCOUNTER — Inpatient Hospital Stay (HOSPITAL_COMMUNITY)
Admission: AD | Admit: 2019-07-27 | Discharge: 2019-07-27 | Disposition: A | Discharge status: Home / Self Care | Payer: Medicaid Other | Attending: Obstetrics and Gynecology | Admitting: Obstetrics and Gynecology

## 2019-07-27 ENCOUNTER — Other Ambulatory Visit: Payer: Self-pay

## 2019-07-27 ENCOUNTER — Encounter (HOSPITAL_COMMUNITY): Payer: Self-pay

## 2019-07-27 DIAGNOSIS — R1030 Lower abdominal pain, unspecified: Secondary | ICD-10-CM | POA: Diagnosis present

## 2019-07-27 DIAGNOSIS — R109 Unspecified abdominal pain: Secondary | ICD-10-CM | POA: Diagnosis not present

## 2019-07-27 DIAGNOSIS — O26893 Other specified pregnancy related conditions, third trimester: Secondary | ICD-10-CM | POA: Insufficient documentation

## 2019-07-27 DIAGNOSIS — F17210 Nicotine dependence, cigarettes, uncomplicated: Secondary | ICD-10-CM | POA: Insufficient documentation

## 2019-07-27 DIAGNOSIS — Z3689 Encounter for other specified antenatal screening: Secondary | ICD-10-CM

## 2019-07-27 DIAGNOSIS — Z3A31 31 weeks gestation of pregnancy: Secondary | ICD-10-CM | POA: Diagnosis not present

## 2019-07-27 DIAGNOSIS — O99333 Smoking (tobacco) complicating pregnancy, third trimester: Secondary | ICD-10-CM | POA: Diagnosis not present

## 2019-07-27 DIAGNOSIS — O26899 Other specified pregnancy related conditions, unspecified trimester: Secondary | ICD-10-CM

## 2019-07-27 LAB — URINALYSIS, ROUTINE W REFLEX MICROSCOPIC
Bilirubin Urine: NEGATIVE
Glucose, UA: NEGATIVE mg/dL
Hgb urine dipstick: NEGATIVE
Ketones, ur: NEGATIVE mg/dL
Leukocytes,Ua: NEGATIVE
Nitrite: NEGATIVE
Protein, ur: NEGATIVE mg/dL
Specific Gravity, Urine: 1.018 (ref 1.005–1.030)
pH: 6 (ref 5.0–8.0)

## 2019-07-27 LAB — WET PREP, GENITAL
Clue Cells Wet Prep HPF POC: NONE SEEN
Sperm: NONE SEEN
Trich, Wet Prep: NONE SEEN
Yeast Wet Prep HPF POC: NONE SEEN

## 2019-07-27 MED ORDER — NIFEDIPINE 10 MG PO CAPS
10.0000 mg | ORAL_CAPSULE | Freq: Once | ORAL | Status: AC
  Administered 2019-07-27: 10 mg via ORAL
  Filled 2019-07-27: qty 1

## 2019-07-27 NOTE — MAU Note (Signed)
Pt reports ctx and pelvic pressure for the past few hours, pt reports some mucous like dc but denies LOF, reports good fm.

## 2019-07-27 NOTE — MAU Provider Note (Signed)
History     CSN: [id number]  Arrival date and time: 07/27/19 1715   First Provider Initiated Contact with Patient 07/27/19 1745      Chief Complaint  Patient presents with  . Contractions  . Back Pain   28 y.o. G2P1001 @31 .5 wks presenting with low abd cramping and pressure. Reports onset of sx 3 days ago. Cramping is intermittent. Denies ctx. Denies VB and LOF. Had some spotting yesterday. No recent sex. Denies urinary sx. Admits to poor hydration. +FM.   OB History    Gravida  5   Para  1   Term  1   Preterm      AB  3   Living  1     SAB  2   TAB  1   Ectopic      Multiple      Live Births  1           Past Medical History:  Diagnosis Date  . Anemia   . Asthma   . Depression   . Generalized anxiety disorder   . GERD (gastroesophageal reflux disease)   . History of chlamydia     Past Surgical History:  Procedure Laterality Date  . DILATION AND CURETTAGE OF UTERUS      Family History  Problem Relation Age of Onset  . Anesthesia problems Mother   . Arthritis Mother   . Asthma Brother   . Seizures Maternal Aunt   . Anesthesia problems Maternal Aunt   . Hypertension Paternal Aunt   . Cancer Paternal Aunt        lung  . Hypertension Paternal Uncle   . Cancer Maternal Grandmother        cervix  . Seizures Maternal Grandmother   . Hypertension Maternal Grandfather   . Cancer Maternal Grandfather        prostate  . Hypertension Paternal Grandfather   . Mental retardation Cousin        cp x2    Social History   Tobacco Use  . Smoking status: Current Every Day Smoker    Packs/day: 0.25    Years: 2.00    Pack years: 0.50    Types: Cigarettes  . Smokeless tobacco: Never Used  Substance Use Topics  . Alcohol use: No  . Drug use: No    Allergies: No Known Allergies  Medications Prior to Admission  Medication Sig Dispense Refill Last Dose  . albuterol (PROVENTIL HFA;VENTOLIN HFA) 108 (90 Base) MCG/ACT inhaler Inhale 1-2 puffs into  the lungs every 6 (six) hours as needed for wheezing or shortness of breath.     . fluticasone furoate-vilanterol (BREO ELLIPTA) 100-25 MCG/INH AEPB Inhale 2 puffs into the lungs daily.     . Prenatal Vit-Fe Fumarate-FA (PREPLUS) 27-1 MG TABS Take 1 tablet by mouth daily.     Marland Kitchen. terconazole (TERAZOL 3) 0.8 % vaginal cream Place 1 applicator vaginally at bedtime.       Review of Systems  Constitutional: Negative for chills and fever.  Gastrointestinal: Positive for abdominal pain.  Genitourinary: Negative for dysuria, frequency, urgency, vaginal bleeding and vaginal discharge.  Musculoskeletal: Positive for back pain.   Physical Exam   Blood pressure 129/80, pulse 83, temperature 98.5 F (36.9 C), resp. rate 20, last menstrual period 11/07/2018, SpO2 100 %.  Physical Exam  Nursing note and vitals reviewed. Constitutional: She is oriented to person, place, and time. She appears well-developed and well-nourished. No distress.  HENT:  Head: Normocephalic  and atraumatic.  Neck: Normal range of motion.  Cardiovascular: Normal rate.  Respiratory: Effort normal. No respiratory distress.  GI: Soft. She exhibits no distension. There is no abdominal tenderness.  gravid  Genitourinary:    Genitourinary Comments: SVE closed/thick   Musculoskeletal: Normal range of motion.  Neurological: She is alert and oriented to person, place, and time.  Skin: Skin is warm and dry.  Psychiatric: She has a normal mood and affect.  EFM: 150 bpm, mod variability, + accels, no decels Toco: irritablity  Results for orders placed or performed during the hospital encounter of 07/27/19 (from the past 24 hour(s))  Urinalysis, Routine w reflex microscopic     Status: Abnormal   Collection Time: 07/27/19  5:45 PM  Result Value Ref Range   Color, Urine YELLOW YELLOW   APPearance CLOUDY (A) CLEAR   Specific Gravity, Urine 1.018 1.005 - 1.030   pH 6.0 5.0 - 8.0   Glucose, UA NEGATIVE NEGATIVE mg/dL   Hgb urine  dipstick NEGATIVE NEGATIVE   Bilirubin Urine NEGATIVE NEGATIVE   Ketones, ur NEGATIVE NEGATIVE mg/dL   Protein, ur NEGATIVE NEGATIVE mg/dL   Nitrite NEGATIVE NEGATIVE   Leukocytes,Ua NEGATIVE NEGATIVE  Wet prep, genital     Status: Abnormal   Collection Time: 07/27/19  5:54 PM  Result Value Ref Range   Yeast Wet Prep HPF POC NONE SEEN NONE SEEN   Trich, Wet Prep NONE SEEN NONE SEEN   Clue Cells Wet Prep HPF POC NONE SEEN NONE SEEN   WBC, Wet Prep HPF POC FEW (A) NONE SEEN   Sperm NONE SEEN    MAU Course  Procedures Procardia x2  MDM Labs ordered and reviewed. No evidence of PTL or UTI. Pt feeling no cramping after po hydration and Procardia. Stable for discharge home.   Assessment and Plan   1. [redacted] weeks gestation of pregnancy   2. NST (non-stress test) reactive   3. Abdominal cramping affecting pregnancy    Discharge home [facility] as scheduled this week PTL precautions Hydrate- 5-6 bottle H2O daily  Allergies as of 07/27/2019   No Known Allergies     Medication List    STOP taking these medications   terconazole 0.8 % vaginal cream Commonly known as: TERAZOL 3     TAKE these medications   albuterol 108 (90 Base) MCG/ACT inhaler Commonly known as: VENTOLIN HFA Inhale 1-2 puffs into the lungs every 6 (six) hours as needed for wheezing or shortness of breath.   Breo Ellipta 100-25 MCG/INH Aepb Generic drug: fluticasone furoate-vilanterol Inhale 2 puffs into the lungs daily.   PrePLUS 27-1 MG Tabs Take 1 tablet by mouth daily.      Julianne Handler, CNM 07/27/2019, 7:33 PM

## 2019-07-27 NOTE — Discharge Instructions (Signed)
Abdominal Pain During Pregnancy ° °Belly (abdominal) pain is common during pregnancy. There are many possible causes. Most of the time, it is not a serious problem. Other times, it can be a sign that something is wrong with the pregnancy. Always tell your doctor if you have belly pain. °Follow these instructions at home: °· Do not have sex or put anything in your vagina until your pain goes away completely. °· Get plenty of rest until your pain gets better. °· Drink enough fluid to keep your pee (urine) pale yellow. °· Take over-the-counter and prescription medicines only as told by your doctor. °· Keep all follow-up visits as told by your doctor. This is important. °Contact a doctor if: °· Your pain continues or gets worse after resting. °· You have lower belly pain that: °? Comes and goes at regular times. °? Spreads to your back. °? Feels like menstrual cramps. °· You have pain or burning when you pee (urinate). °Get help right away if: °· You have a fever or chills. °· You have vaginal bleeding. °· You are leaking fluid from your vagina. °· You are passing tissue from your vagina. °· You throw up (vomit) for more than 24 hours. °· You have watery poop (diarrhea) for more than 24 hours. °· Your baby is moving less than usual. °· You feel very weak or faint. °· You have shortness of breath. °· You have very bad pain in your upper belly. °Summary °· Belly (abdominal) pain is common during pregnancy. There are many possible causes. °· If you have belly pain during pregnancy, tell your doctor right away. °· Keep all follow-up visits as told by your doctor. This is important. °This information is not intended to replace advice given to you by your health care provider. Make sure you discuss any questions you have with your health care provider. °Document Released: 11/22/2009 Document Revised: 03/24/2019 Document Reviewed: 03/08/2017 °Elsevier Patient Education © 2020 [employer] ° °

## 2019-07-29 LAB — GC/CHLAMYDIA PROBE AMP (~~LOC~~) NOT AT ARMC
Chlamydia: NEGATIVE
Neisseria Gonorrhea: NEGATIVE

## 2019-08-13 ENCOUNTER — Ambulatory Visit (HOSPITAL_COMMUNITY)
Admission: RE | Admit: 2019-08-13 | Discharge: 2019-08-13 | Disposition: A | Discharge status: Home / Self Care | Payer: Medicaid Other | Source: Ambulatory Visit | Attending: Obstetrics and Gynecology | Admitting: Obstetrics and Gynecology

## 2019-08-13 ENCOUNTER — Other Ambulatory Visit: Payer: Self-pay

## 2019-08-13 ENCOUNTER — Encounter (HOSPITAL_COMMUNITY): Payer: Self-pay

## 2019-08-13 ENCOUNTER — Ambulatory Visit (HOSPITAL_COMMUNITY): Payer: Medicaid Other | Admitting: *Deleted

## 2019-08-13 VITALS — BP 117/76 | HR 96 | Temp 99.0°F

## 2019-08-13 DIAGNOSIS — Z362 Encounter for other antenatal screening follow-up: Secondary | ICD-10-CM | POA: Insufficient documentation

## 2019-08-13 DIAGNOSIS — O09293 Supervision of pregnancy with other poor reproductive or obstetric history, third trimester: Secondary | ICD-10-CM | POA: Diagnosis not present

## 2019-08-13 DIAGNOSIS — Z3A34 34 weeks gestation of pregnancy: Secondary | ICD-10-CM | POA: Diagnosis not present

## 2019-08-13 DIAGNOSIS — Q632 Ectopic kidney: Secondary | ICD-10-CM | POA: Insufficient documentation

## 2019-08-13 DIAGNOSIS — O359XX0 Maternal care for (suspected) fetal abnormality and damage, unspecified, not applicable or unspecified: Secondary | ICD-10-CM | POA: Diagnosis not present

## 2019-08-14 ENCOUNTER — Encounter (HOSPITAL_COMMUNITY): Payer: Self-pay

## 2019-08-14 ENCOUNTER — Ambulatory Visit (HOSPITAL_COMMUNITY): Payer: Medicaid Other

## 2019-09-05 ENCOUNTER — Encounter (HOSPITAL_COMMUNITY): Payer: Self-pay | Admitting: *Deleted

## 2019-09-05 ENCOUNTER — Inpatient Hospital Stay (HOSPITAL_COMMUNITY): Payer: Medicaid Other

## 2019-09-05 ENCOUNTER — Telehealth (HOSPITAL_COMMUNITY): Payer: Self-pay | Admitting: *Deleted

## 2019-09-05 ENCOUNTER — Other Ambulatory Visit: Payer: Self-pay

## 2019-09-05 ENCOUNTER — Encounter (HOSPITAL_COMMUNITY): Payer: Self-pay

## 2019-09-05 ENCOUNTER — Inpatient Hospital Stay (HOSPITAL_COMMUNITY)
Admission: AD | Admit: 2019-09-05 | Discharge: 2019-09-08 | DRG: 807 | Disposition: A | Discharge status: Home / Self Care | Payer: Medicaid Other | Attending: Obstetrics and Gynecology | Admitting: Obstetrics and Gynecology

## 2019-09-05 DIAGNOSIS — O99824 Streptococcus B carrier state complicating childbirth: Principal | ICD-10-CM | POA: Diagnosis present

## 2019-09-05 DIAGNOSIS — O9902 Anemia complicating childbirth: Secondary | ICD-10-CM | POA: Diagnosis present

## 2019-09-05 DIAGNOSIS — O368390 Maternal care for abnormalities of the fetal heart rate or rhythm, unspecified trimester, not applicable or unspecified: Secondary | ICD-10-CM

## 2019-09-05 DIAGNOSIS — O9962 Diseases of the digestive system complicating childbirth: Secondary | ICD-10-CM | POA: Diagnosis present

## 2019-09-05 DIAGNOSIS — D649 Anemia, unspecified: Secondary | ICD-10-CM | POA: Diagnosis present

## 2019-09-05 DIAGNOSIS — O368130 Decreased fetal movements, third trimester, not applicable or unspecified: Secondary | ICD-10-CM | POA: Diagnosis not present

## 2019-09-05 DIAGNOSIS — Z20828 Contact with and (suspected) exposure to other viral communicable diseases: Secondary | ICD-10-CM | POA: Diagnosis present

## 2019-09-05 DIAGNOSIS — Z3A37 37 weeks gestation of pregnancy: Secondary | ICD-10-CM

## 2019-09-05 DIAGNOSIS — F329 Major depressive disorder, single episode, unspecified: Secondary | ICD-10-CM | POA: Diagnosis present

## 2019-09-05 DIAGNOSIS — O99344 Other mental disorders complicating childbirth: Secondary | ICD-10-CM | POA: Diagnosis present

## 2019-09-05 DIAGNOSIS — O368330 Maternal care for abnormalities of the fetal heart rate or rhythm, third trimester, not applicable or unspecified: Secondary | ICD-10-CM | POA: Diagnosis not present

## 2019-09-05 DIAGNOSIS — O9952 Diseases of the respiratory system complicating childbirth: Secondary | ICD-10-CM | POA: Diagnosis present

## 2019-09-05 DIAGNOSIS — O359XX0 Maternal care for (suspected) fetal abnormality and damage, unspecified, not applicable or unspecified: Secondary | ICD-10-CM

## 2019-09-05 DIAGNOSIS — F17210 Nicotine dependence, cigarettes, uncomplicated: Secondary | ICD-10-CM | POA: Diagnosis present

## 2019-09-05 DIAGNOSIS — J45909 Unspecified asthma, uncomplicated: Secondary | ICD-10-CM | POA: Diagnosis present

## 2019-09-05 DIAGNOSIS — O09293 Supervision of pregnancy with other poor reproductive or obstetric history, third trimester: Secondary | ICD-10-CM | POA: Diagnosis not present

## 2019-09-05 DIAGNOSIS — K219 Gastro-esophageal reflux disease without esophagitis: Secondary | ICD-10-CM | POA: Diagnosis present

## 2019-09-05 DIAGNOSIS — O26893 Other specified pregnancy related conditions, third trimester: Secondary | ICD-10-CM | POA: Diagnosis present

## 2019-09-05 DIAGNOSIS — O99334 Smoking (tobacco) complicating childbirth: Secondary | ICD-10-CM | POA: Diagnosis present

## 2019-09-05 LAB — SARS CORONAVIRUS 2 BY RT PCR (HOSPITAL ORDER, PERFORMED IN ~~LOC~~ HOSPITAL LAB): SARS Coronavirus 2: NEGATIVE

## 2019-09-05 LAB — TYPE AND SCREEN
ABO/RH(D): A POS
Antibody Screen: NEGATIVE

## 2019-09-05 LAB — CBC
HCT: 31.8 % — ABNORMAL LOW (ref 36.0–46.0)
Hemoglobin: 10.2 g/dL — ABNORMAL LOW (ref 12.0–15.0)
MCH: 29.7 pg (ref 26.0–34.0)
MCHC: 32.1 g/dL (ref 30.0–36.0)
MCV: 92.4 fL (ref 80.0–100.0)
Platelets: 187 10*3/uL (ref 150–400)
RBC: 3.44 MIL/uL — ABNORMAL LOW (ref 3.87–5.11)
RDW: 14.1 % (ref 11.5–15.5)
WBC: 7.1 10*3/uL (ref 4.0–10.5)
nRBC: 0 % (ref 0.0–0.2)

## 2019-09-05 LAB — ABO/RH: ABO/RH(D): A POS

## 2019-09-05 MED ORDER — OXYCODONE-ACETAMINOPHEN 5-325 MG PO TABS: 2.0000 | ORAL_TABLET | ORAL | Status: DC | PRN

## 2019-09-05 MED ORDER — BUTORPHANOL TARTRATE 1 MG/ML IJ SOLN
1.0000 mg | INTRAMUSCULAR | Status: DC | PRN
  Administered 2019-09-06 (×2): 1 mg via INTRAVENOUS
  Filled 2019-09-05 (×2): qty 1

## 2019-09-05 MED ORDER — SOD CITRATE-CITRIC ACID 500-334 MG/5ML PO SOLN: 30.0000 mL | ORAL | Status: DC | PRN

## 2019-09-05 MED ORDER — ACETAMINOPHEN 325 MG PO TABS: 650.0000 mg | ORAL_TABLET | ORAL | Status: DC | PRN

## 2019-09-05 MED ORDER — LIDOCAINE HCL (PF) 1 % IJ SOLN: 30.0000 mL | INTRAMUSCULAR | Status: DC | PRN

## 2019-09-05 MED ORDER — SODIUM CHLORIDE 0.9 % IV SOLN
5.0000 10*6.[IU] | Freq: Once | INTRAVENOUS | Status: AC
  Administered 2019-09-05: 5 10*6.[IU] via INTRAVENOUS
  Filled 2019-09-05: qty 5

## 2019-09-05 MED ORDER — OXYCODONE-ACETAMINOPHEN 5-325 MG PO TABS: 1.0000 | ORAL_TABLET | ORAL | Status: DC | PRN

## 2019-09-05 MED ORDER — PENICILLIN G 3 MILLION UNITS IVPB - SIMPLE MED
3.0000 10*6.[IU] | INTRAVENOUS | Status: DC
  Administered 2019-09-06 (×2): 3 10*6.[IU] via INTRAVENOUS
  Filled 2019-09-05 (×2): qty 100

## 2019-09-05 MED ORDER — OXYTOCIN 40 UNITS IN NORMAL SALINE INFUSION - SIMPLE MED: 2.5000 [IU]/h | INTRAVENOUS | Status: DC

## 2019-09-05 MED ORDER — LACTATED RINGERS IV SOLN: 500.0000 mL | INTRAVENOUS | Status: DC | PRN

## 2019-09-05 MED ORDER — ONDANSETRON HCL 4 MG/2ML IJ SOLN: 4.0000 mg | Freq: Four times a day (QID) | INTRAMUSCULAR | Status: DC | PRN

## 2019-09-05 MED ORDER — OXYTOCIN BOLUS FROM INFUSION
500.0000 mL | Freq: Once | INTRAVENOUS | Status: AC
  Administered 2019-09-06: 08:00:00 500 mL via INTRAVENOUS

## 2019-09-05 MED ORDER — TERBUTALINE SULFATE 1 MG/ML IJ SOLN: 0.2500 mg | Freq: Once | INTRAMUSCULAR | Status: DC | PRN

## 2019-09-05 MED ORDER — LACTATED RINGERS IV SOLN
INTRAVENOUS | Status: DC
  Administered 2019-09-05: 20:00:00 via INTRAVENOUS
  Administered 2019-09-06: 125 mL via INTRAVENOUS

## 2019-09-05 MED ORDER — OXYTOCIN 40 UNITS IN NORMAL SALINE INFUSION - SIMPLE MED
1.0000 m[IU]/min | INTRAVENOUS | Status: DC
  Administered 2019-09-05: 2 m[IU]/min via INTRAVENOUS
  Filled 2019-09-05: qty 1000

## 2019-09-05 NOTE — H&P (Signed)
[name] is a 28 y.o.G55P1011 female presenting at 67 3/7wks. Pt was seen in office today for contractions and noted to be 3cm dilated. On NST, late deceleration into the 80s noted for a minute. Pt subsequently had a BPP done in MAU - 6/8 ( 6/10). Pt is dated per  8week [place]. Her pregnancy has been complicated by GERD, anxiety/depression, asthma - stable. She has a history of recurrent abs - was on baby asa this pregnancy. GBS + noted in urine  Baby with left pelvic kidney - has been followed by MFM; plan to follow with pediatric urology after delivery  OB History    Gravida  5   Para  1   Term  1   Preterm      AB  3   Living  1     SAB  2   TAB  1   Ectopic      Multiple      Live Births  1          Past Medical History:  Diagnosis Date  . Anemia   . Asthma   . Depression   . Generalized anxiety disorder   . GERD (gastroesophageal reflux disease)   . History of chlamydia    Past Surgical History:  Procedure Laterality Date  . DILATION AND CURETTAGE OF UTERUS     Family History: family history includes Anesthesia problems in her maternal aunt and mother; Arthritis in her mother; Asthma in her brother; Cancer in her maternal grandfather, maternal grandmother, and paternal aunt; Hypertension in her maternal grandfather, paternal aunt, paternal grandfather, and paternal uncle; Mental retardation in her cousin; Seizures in her maternal aunt and maternal grandmother. Social History:  reports that she has been smoking cigarettes. She has a 0.50 pack-year smoking history. She has never used smokeless tobacco. She reports that she does not drink alcohol or use drugs.     Maternal Diabetes: No Genetic Screening: Abnormal:  Results: Other:CF+ Maternal Ultrasounds/Referrals: Fetal Kidney Anomalies see HPI Fetal Ultrasounds or other Referrals:  None, Other: pediatric urology Maternal Substance Abuse:  Yes:  Type: Smoker - stopped with pregnancy Significant Maternal  Medications:  None Significant Maternal Lab Results:  Group B Strep positive Other Comments:  None  Review of Systems  Constitutional: Positive for malaise/fatigue. Negative for chills, fever and weight loss.  Eyes: Negative for blurred vision and double vision.  Respiratory: Negative for shortness of breath.   Cardiovascular: Negative for chest pain and leg swelling.  Gastrointestinal: Positive for abdominal pain. Negative for heartburn, nausea and vomiting.  Genitourinary: Negative for dysuria.  Musculoskeletal: Positive for back pain and myalgias.  Skin: Negative for itching and rash.  Neurological: Negative for dizziness and headaches.  Endo/Heme/Allergies: Negative for environmental allergies. Does not bruise/bleed easily.  Psychiatric/Behavioral: Negative for depression, hallucinations, substance abuse and suicidal ideas. The patient is nervous/anxious.    Maternal Medical History:  Reason for admission: Nausea. BPP 6/10 at term  Contractions: Onset was yesterday.   Frequency: regular.   Perceived severity is moderate.    Fetal activity: Perceived fetal activity is normal.   Last perceived fetal movement was within the past hour.    Prenatal complications: no prenatal complications Prenatal Complications - Diabetes: none.    Dilation: 3 Effacement (%): 50 Station: Ballotable Exam by:: T Lytle RN  Blood pressure 133/81, pulse 88, temperature 98.4 F (36.9 C), temperature source Oral, resp. rate 18, height 5\' 5"  (1.651 m), weight 89.4 kg, last menstrual  period 11/07/2018, SpO2 100 %. Maternal Exam:  Uterine Assessment: Contraction strength is moderate.  Contraction frequency is irregular.   Abdomen: Patient reports generalized tenderness.  Estimated fetal weight is AGA.   Fetal presentation: vertex  Introitus: Normal vulva. Vulva is negative for condylomata and lesion.  Normal vagina.  Vagina is negative for condylomata.  Pelvis: adequate for delivery.   Cervix:  Cervix evaluated by digital exam.     Fetal Exam Fetal Monitor Review: Baseline rate: 135.  Variability: moderate (6-25 bpm).   Pattern: accelerations present and late decelerations.    Fetal State Assessment: Category I - tracings are normal.     Physical Exam  Constitutional: She is oriented to person, place, and time. She appears well-developed and well-nourished.  Neck: Normal range of motion.  Cardiovascular: Intact distal pulses.  Respiratory: Effort normal.  GI: Soft. There is generalized abdominal tenderness.  Genitourinary:    Vulva, vagina and uterus normal.     No vulval condylomata or lesion noted.   Musculoskeletal: Normal range of motion.        General: No edema.  Neurological: She is alert and oriented to person, place, and time.  Skin: Skin is warm.  Psychiatric: She has a normal mood and affect. Her behavior is normal. Judgment and thought content normal.    Prenatal labs: ABO, Rh: A/Positive/-- (03/02 0000) Antibody: Negative (03/02 0000) Rubella: Immune (03/02 0000) RPR: Nonreactive (03/02 0000)  HBsAg: Negative (03/02 0000)  HIV: Non-reactive (03/02 0000)  GBS: Positive/-- (03/02 0000)   Assessment/Plan: 78GN [id number] G3P1011 female with BPP of 6/10 and favorable cervix for iol Admit PCN for GBS  SARs Covid screen AROM now/pitocin if able Pain control prn Anticipate svd   Cecilia W Banga 09/05/2019, 8:12 PM

## 2019-09-05 NOTE — MAU Provider Note (Signed)
Chief Complaint:  fetal monitoring   First Provider Initiated Contact with Patient 09/05/19 1822     HPI: [name] is a 28 y.o. [id number] at 6950w3d who presents to maternity admissions from the office for fetal monitoring. [facility] Was in the office this afternoon due to contractions. Cervix was dilated 3 cm. While on NST she had some fetal decelerations & was sent to MAU for monitoring. Reports continued contractions that are not as painful. Denies vaginal bleeding or LOF. Good fetal movement.   Past Medical History:  Diagnosis Date  . Anemia   . Asthma   . Depression   . Generalized anxiety disorder   . GERD (gastroesophageal reflux disease)   . History of chlamydia    OB History  Gravida Para Term Preterm AB Living  5 1 1   3 1   SAB TAB Ectopic Multiple Live Births  2 1     1     # Outcome Date GA Lbr Len/2nd Weight Sex Delivery Anes PTL Lv  5 Current           4 TAB 2015          3 Term 12/26/11 3312w3d 14:04 / 00:34 3320 g F Vag-Spont EPI  LIV  2 SAB           1 SAB            Past Surgical History:  Procedure Laterality Date  . DILATION AND CURETTAGE OF UTERUS     Family History  Problem Relation Age of Onset  . Anesthesia problems Mother   . Arthritis Mother   . Asthma Brother   . Seizures Maternal Aunt   . Anesthesia problems Maternal Aunt   . Hypertension Paternal Aunt   . Cancer Paternal Aunt        lung  . Hypertension Paternal Uncle   . Cancer Maternal Grandmother        cervix  . Seizures Maternal Grandmother   . Hypertension Maternal Grandfather   . Cancer Maternal Grandfather        prostate  . Hypertension Paternal Grandfather   . Mental retardation Cousin        cp x2   Social History   Tobacco Use  . Smoking status: Current Every Day Smoker    Packs/day: 0.25    Years: 2.00    Pack years: 0.50    Types: Cigarettes  . Smokeless tobacco: Never Used  Substance Use Topics  . Alcohol use: No  . Drug use: No   No Known  Allergies Medications Prior to Admission  Medication Sig Dispense Refill Last Dose  . albuterol (PROVENTIL HFA;VENTOLIN HFA) 108 (90 Base) MCG/ACT inhaler Inhale 1-2 puffs into the lungs every 6 (six) hours as needed for wheezing or shortness of breath.     . fluticasone furoate-vilanterol (BREO ELLIPTA) 100-25 MCG/INH AEPB Inhale 2 puffs into the lungs daily.     . Prenatal Vit-Fe Fumarate-FA (PREPLUS) 27-1 MG TABS Take 1 tablet by mouth daily.       I have reviewed patient's Past Medical Hx, Surgical Hx, Family Hx, Social Hx, medications and allergies.   ROS:  Review of Systems  Constitutional: Negative.   Gastrointestinal: Positive for abdominal pain.  Genitourinary: Negative.     Physical Exam   Patient Vitals for the past 24 hrs:  BP Temp Temp src Pulse Resp SpO2 Height Weight  09/05/19 1759 133/81 98.4 F (36.9 C) Oral 88 18 100 %  5\' 5"  (1.651 m) 89.4 kg    Constitutional: Well-developed, well-nourished female in no acute distress.  Cardiovascular: normal rate & rhythm, no murmur Respiratory: normal effort, lung sounds clear throughout GI: Abd soft, non-tender, gravid appropriate for gestational age. Pos BS x 4 MS: Extremities nontender, no edema, normal ROM Neurologic: Alert and oriented x 4.   NST:  Baseline: 145 bpm, Variability: Good {> 6 bpm), Accelerations: Reactive and Decelerations: Absent      Dilation: 3 Effacement (%): 50 Cervical Position: Middle Station: Ballotable Presentation: Vertex Exam by:: Frances Maywood RN      Labs: No results found for this or any previous visit (from the past 24 hour(s)).  Imaging:  No results found.  MAU Course: Orders Placed This Encounter  Procedures  . [place] MFM FETAL BPP WO NON STRESS   No orders of the defined types were placed in this encounter.   MDM: Reactive NST while in MAU. S/w Dr. Terri Piedra regarding tracing in the office, as per patient it occurred multiple times. Dr. Terri Piedra requests BPP BPP 6/8, off for fetal  breathing.   Cervical exam unchanged since office visit  Assessment: 1. [redacted] weeks gestation of pregnancy   2. Maternal care for fetal decelerations during pregnancy     Plan: Dr. Terri Piedra to admit patient to birthing suites  Jorje Guild, NP 09/05/2019 7:53 PM

## 2019-09-05 NOTE — MAU Note (Signed)
Pt sent from office due to "baby's heartrate dropping after a contraction" during an NST. Patient says her cervix was 3cm at the office.

## 2019-09-05 NOTE — Telephone Encounter (Signed)
Preadmission screen  

## 2019-09-05 NOTE — Progress Notes (Signed)
Patient ID: [name], female   DOB: [birth year], 28 y.o.   MRN: [id number] Pt doing well. Ctxs not too painful. +Fms VSS EFM - 140s, cat 1........Marland Kitchenhad a decel when changed positions about an hour ago. Spontaneously resolved, none since TOCO  - ctxs q 3-43mins SVE - 4/70/-2  A/P: [id number] at 63 3/7wks progressing in labor on pitocin at 37mus         AROM performed with clear fluid noted         Pain control prn         Anticipate svd

## 2019-09-06 ENCOUNTER — Inpatient Hospital Stay (HOSPITAL_COMMUNITY): Payer: Medicaid Other | Admitting: Anesthesiology

## 2019-09-06 ENCOUNTER — Encounter (HOSPITAL_COMMUNITY): Payer: Self-pay | Admitting: Anesthesiology

## 2019-09-06 LAB — RPR: RPR Ser Ql: NONREACTIVE

## 2019-09-06 MED ORDER — OXYCODONE HCL 5 MG PO TABS: 10.0000 mg | ORAL_TABLET | ORAL | Status: DC | PRN

## 2019-09-06 MED ORDER — PHENYLEPHRINE 40 MCG/ML (10ML) SYRINGE FOR IV PUSH (FOR BLOOD PRESSURE SUPPORT): 80.0000 ug | PREFILLED_SYRINGE | INTRAVENOUS | Status: DC | PRN

## 2019-09-06 MED ORDER — SENNOSIDES-DOCUSATE SODIUM 8.6-50 MG PO TABS
2.0000 | ORAL_TABLET | ORAL | Status: DC
  Administered 2019-09-06 – 2019-09-07 (×2): 2 via ORAL
  Filled 2019-09-06 (×2): qty 2

## 2019-09-06 MED ORDER — FENTANYL-BUPIVACAINE-NACL 0.5-0.125-0.9 MG/250ML-% EP SOLN
12.0000 mL/h | EPIDURAL | Status: DC | PRN
  Filled 2019-09-06: qty 250

## 2019-09-06 MED ORDER — METHYLERGONOVINE MALEATE 0.2 MG/ML IJ SOLN
INTRAMUSCULAR | Status: AC
  Filled 2019-09-06: qty 1

## 2019-09-06 MED ORDER — TETANUS-DIPHTH-ACELL PERTUSSIS 5-2.5-18.5 LF-MCG/0.5 IM SUSP: 0.5000 mL | Freq: Once | INTRAMUSCULAR | Status: DC

## 2019-09-06 MED ORDER — SODIUM CHLORIDE (PF) 0.9 % IJ SOLN
INTRAMUSCULAR | Status: DC | PRN
  Administered 2019-09-06: 12 mL/h via EPIDURAL

## 2019-09-06 MED ORDER — LACTATED RINGERS IV SOLN: 500.0000 mL | Freq: Once | INTRAVENOUS | Status: DC

## 2019-09-06 MED ORDER — ONDANSETRON HCL 4 MG PO TABS: 4.0000 mg | ORAL_TABLET | ORAL | Status: DC | PRN

## 2019-09-06 MED ORDER — EPHEDRINE 5 MG/ML INJ: 10.0000 mg | INTRAVENOUS | Status: DC | PRN

## 2019-09-06 MED ORDER — WITCH HAZEL-GLYCERIN EX PADS: 1.0000 "application " | MEDICATED_PAD | CUTANEOUS | Status: DC | PRN

## 2019-09-06 MED ORDER — LACTATED RINGERS IV SOLN
500.0000 mL | Freq: Once | INTRAVENOUS | Status: AC
  Administered 2019-09-06: 04:00:00 500 mL via INTRAVENOUS

## 2019-09-06 MED ORDER — DIBUCAINE (PERIANAL) 1 % EX OINT: 1.0000 "application " | TOPICAL_OINTMENT | CUTANEOUS | Status: DC | PRN

## 2019-09-06 MED ORDER — COCONUT OIL OIL
1.0000 "application " | TOPICAL_OIL | Status: DC | PRN
  Administered 2019-09-07: 1 via TOPICAL

## 2019-09-06 MED ORDER — OXYCODONE HCL 5 MG PO TABS: 5.0000 mg | ORAL_TABLET | ORAL | Status: DC | PRN

## 2019-09-06 MED ORDER — ZOLPIDEM TARTRATE 5 MG PO TABS: 5.0000 mg | ORAL_TABLET | Freq: Every evening | ORAL | Status: DC | PRN

## 2019-09-06 MED ORDER — ACETAMINOPHEN 325 MG PO TABS
650.0000 mg | ORAL_TABLET | ORAL | Status: DC | PRN
  Administered 2019-09-06 – 2019-09-07 (×4): 650 mg via ORAL
  Filled 2019-09-06 (×3): qty 2

## 2019-09-06 MED ORDER — DIPHENHYDRAMINE HCL 25 MG PO CAPS: 25.0000 mg | ORAL_CAPSULE | Freq: Four times a day (QID) | ORAL | Status: DC | PRN

## 2019-09-06 MED ORDER — DIPHENHYDRAMINE HCL 50 MG/ML IJ SOLN: 12.5000 mg | INTRAMUSCULAR | Status: DC | PRN

## 2019-09-06 MED ORDER — MISOPROSTOL 200 MCG PO TABS: 800.0000 ug | ORAL_TABLET | Freq: Once | ORAL | Status: AC

## 2019-09-06 MED ORDER — MISOPROSTOL 200 MCG PO TABS
ORAL_TABLET | ORAL | Status: AC
  Administered 2019-09-06: 800 ug
  Filled 2019-09-06: qty 4

## 2019-09-06 MED ORDER — METHYLERGONOVINE MALEATE 0.2 MG/ML IJ SOLN
0.2000 mg | Freq: Once | INTRAMUSCULAR | Status: AC
  Administered 2019-09-06: 0.2 mg via INTRAMUSCULAR

## 2019-09-06 MED ORDER — ONDANSETRON HCL 4 MG/2ML IJ SOLN: 4.0000 mg | INTRAMUSCULAR | Status: DC | PRN

## 2019-09-06 MED ORDER — IBUPROFEN 600 MG PO TABS
600.0000 mg | ORAL_TABLET | Freq: Four times a day (QID) | ORAL | Status: DC
  Administered 2019-09-06 – 2019-09-08 (×9): 600 mg via ORAL
  Filled 2019-09-06 (×9): qty 1

## 2019-09-06 MED ORDER — BENZOCAINE-MENTHOL 20-0.5 % EX AERO
1.0000 "application " | INHALATION_SPRAY | CUTANEOUS | Status: DC | PRN
  Administered 2019-09-06: 1 via TOPICAL
  Filled 2019-09-06: qty 56

## 2019-09-06 MED ORDER — LIDOCAINE HCL (PF) 1 % IJ SOLN
INTRAMUSCULAR | Status: DC | PRN
  Administered 2019-09-06 (×2): 4 mL via EPIDURAL

## 2019-09-06 MED ORDER — PRENATAL MULTIVITAMIN CH
1.0000 | ORAL_TABLET | Freq: Every day | ORAL | Status: DC
  Administered 2019-09-06 – 2019-09-08 (×3): 1 via ORAL
  Filled 2019-09-06 (×3): qty 1

## 2019-09-06 MED ORDER — SIMETHICONE 80 MG PO CHEW: 80.0000 mg | CHEWABLE_TABLET | ORAL | Status: DC | PRN

## 2019-09-06 NOTE — Anesthesia Procedure Notes (Signed)
Epidural Patient location during procedure: OB Start time: 09/06/2019 4:45 AM End time: 09/06/2019 4:52 AM  Staffing Anesthesiologist: Josephine Igo, MD Performed: anesthesiologist   Preanesthetic Checklist Completed: patient identified, site marked, surgical consent, pre-op evaluation, timeout performed, IV checked, risks and benefits discussed and monitors and equipment checked  Epidural Patient position: sitting Prep: site prepped and draped and DuraPrep Patient monitoring: continuous pulse ox and blood pressure Approach: midline Location: L3-L4 Injection technique: LOR air  Needle:  Needle type: Tuohy  Needle gauge: 17 G Needle length: 9 cm and 9 Needle insertion depth: 5 cm cm Catheter type: closed end flexible Catheter size: 19 Gauge Catheter at skin depth: 10 cm Test dose: negative and Other  Assessment Events: blood not aspirated, injection not painful, no injection resistance, negative IV test and no paresthesia  Additional Notes Patient identified. Risks and benefits discussed including failed block, incomplete  Pain control, post dural puncture headache, nerve damage, paralysis, blood pressure Changes, nausea, vomiting, reactions to medications-both toxic and allergic and post Partum back pain. All questions were answered. Patient expressed understanding and wished to proceed. Sterile technique was used throughout procedure. Epidural site was Dressed with sterile barrier dressing. No paresthesias, signs of intravascular injection Or signs of intrathecal spread were encountered.  Patient was more comfortable after the epidural was dosed. Please see RN's note for documentation of vital signs and FHR which are stable. Reason for block:procedure for pain

## 2019-09-06 NOTE — Lactation Note (Addendum)
This note was copied from a baby's chart. Lactation Consultation Note  Patient Name: [name] [id number] Date: 09/06/2019 Reason for consult: Initial assessment;Difficult latch;Early term 37-38.6wks P2, 10 hour ETI infant. Infant had 3 voids since birth. Mom is active on the [facility] program in [facility] and mom has DEBP at home. Mom really want to try breastfeeding she only breastfed her 28 year old daughter for 2 days due lack of support, help and encouragement. Per mom, she has been experiencing pinching and having painful latches.   LC notice mom has flat nipples, LC gave mom hand pump to pre-pump breast and do breast stimulation prior to latching infant to breast. Mom was given breast shells to wear in her bra during the day to help evert nipple shaft out more, mom understands not to sleep in bra during the night. LC discussed pre-pumping and hand expressing prior to latching infant, rub breast below infant's nose and when his mouth is wide , his tongue is down bring infant to breast chin first and his nose and chin should touch breast. Mom should feel a tug but not pinching or pain, if she experiences that to break latch and re-latch infant again to breast until done correctly.  Infant was reluctant to latch at this time and would not suckle on glove finger was tongue thrusting at this time. Mom taught back hand expression and infant was given 9 ml of colostrum by spoon. Mom will ask for assistance from Nurse or [place] at next  Infant feeding. Mom knows that if infant refuses to latch to hand express and give infant back volume. Mom knows to breastfeed infant according hunger cues, 8 to 12 times within 24 hours and on demand. Reviewed Baby & Me book's Breastfeeding Basics.  Mom made aware of O/P services, breastfeeding support groups, community resources, and our phone # for post-discharge questions.   Maternal Data Formula Feeding for Exclusion: No Has patient been taught Hand  Expression?: Yes(Infant was given 9 m; pf colostrum by spoon.)  Feeding Feeding Type: Breast Fed  LATCH Score Latch: Too sleepy or reluctant, no latch achieved, no sucking elicited.  Audible Swallowing: None  Type of Nipple: Flat  Comfort (Breast/Nipple): Soft / non-tender  Hold (Positioning): Assistance needed to correctly position infant at breast and maintain latch.  LATCH Score: 4  Interventions Interventions: Breast feeding basics reviewed;Breast compression;Adjust position;Assisted with latch;Skin to skin;Support pillows;Hand pump;Position options;Breast massage;Hand express;Expressed milk;Pre-pump if needed;Shells  Lactation Tools Discussed/Used Tools: Shells;Pump Shell Type: Other (comment)(flat) Breast pump type: Manual Pump Review: Setup, frequency, and cleaning;Milk Storage Initiated by:: [name], IBCLC Date initiated:: 09/06/19   Consult Status Consult Status: Follow-up Date: 09/07/19 Follow-up type: In-patient    [name] 09/06/2019, 6:50 PM

## 2019-09-06 NOTE — Anesthesia Postprocedure Evaluation (Signed)
Anesthesia Post Note  Patient: [name]  Procedure(s) Performed: AN AD HOC LABOR EPIDURAL     Patient location during evaluation: Mother Baby Anesthesia Type: Epidural Level of consciousness: awake and alert, oriented and patient cooperative Pain management: pain level controlled Vital Signs Assessment: post-procedure vital signs reviewed and stable Respiratory status: spontaneous breathing Cardiovascular status: stable Postop Assessment: no headache, epidural receding, patient able to bend at knees and no signs of nausea or vomiting Anesthetic complications: no Comments: Pt. Interviewed via phone consultation.  Pt. States she is walking.  Pain score 3.     Last Vitals:  Vitals:   09/06/19 1130 09/06/19 1520  BP: 113/67 108/71  Pulse: 86 88  Resp: 18 16  Temp: 37.1 C 37.4 C  SpO2: 98% 99%    Last Pain:  Vitals:   09/06/19 1739  TempSrc:   PainSc: 0-No pain   Pain Goal: Patients Stated Pain Goal: 0 (09/05/19 1948)                 Rico Sheehan

## 2019-09-06 NOTE — Anesthesia Preprocedure Evaluation (Signed)
Anesthesia Evaluation  Patient identified by MRN, date of birth, ID band Patient awake    Reviewed: Allergy & Precautions, Patient's Chart, lab work & pertinent test results  Airway Mallampati: III  TM Distance: >3 FB Neck ROM: Full    Dental no notable dental hx. (+) Teeth Intact   Pulmonary asthma , Current Smoker,    Pulmonary exam normal breath sounds clear to auscultation       Cardiovascular negative cardio ROS Normal cardiovascular exam Rhythm:Regular Rate:Normal     Neuro/Psych PSYCHIATRIC DISORDERS Anxiety Depression negative neurological ROS     GI/Hepatic Neg liver ROS, GERD  Medicated and Controlled,  Endo/Other  Obesity  Renal/GU negative Renal ROS  negative genitourinary   Musculoskeletal negative musculoskeletal ROS (+)   Abdominal (+) + obese,   Peds  Hematology  (+) anemia ,   Anesthesia Other Findings   Reproductive/Obstetrics (+) Pregnancy                             Anesthesia Physical Anesthesia Plan  ASA: II  Anesthesia Plan: Epidural   Post-op Pain Management:    Induction:   PONV Risk Score and Plan:   Airway Management Planned: Natural Airway  Additional Equipment:   Intra-op Plan:   Post-operative Plan:   Informed Consent: I have reviewed the patients History and Physical, chart, labs and discussed the procedure including the risks, benefits and alternatives for the proposed anesthesia with the patient or authorized representative who has indicated his/her understanding and acceptance.       Plan Discussed with: Anesthesiologist  Anesthesia Plan Comments:         Anesthesia Quick Evaluation

## 2019-09-07 LAB — CBC
HCT: 31.7 % — ABNORMAL LOW (ref 36.0–46.0)
Hemoglobin: 10.2 g/dL — ABNORMAL LOW (ref 12.0–15.0)
MCH: 29.9 pg (ref 26.0–34.0)
MCHC: 32.2 g/dL (ref 30.0–36.0)
MCV: 93 fL (ref 80.0–100.0)
Platelets: 165 10*3/uL (ref 150–400)
RBC: 3.41 MIL/uL — ABNORMAL LOW (ref 3.87–5.11)
RDW: 14.3 % (ref 11.5–15.5)
WBC: 9.6 10*3/uL (ref 4.0–10.5)
nRBC: 0 % (ref 0.0–0.2)

## 2019-09-07 NOTE — Lactation Note (Signed)
This note was copied from a baby's chart. Lactation Consultation Note Called to room to assist in latching. Baby had bath. Mom trying to get baby to feed. Baby in football position, attempted to latch baby. Changed baby's diaper. Baby alert, has no interest in BF. W/nipple touched top lip, baby opened wide, placed on nipple. Baby held nipple in mouth. No attempt to suckle at all. Hand expressed colostrum in mouth. Baby didn't respond. Encouraged mom to cont. To hold STS, let baby be at the breast. Mom states she has been hand expressing and giving baby colostrum in spoon.  Mom states baby has been spitting up. Newborn behavior and feeding habits discussed. Encouraged mom to cont. To try to feed w/cues and every 3 hrs if not cued to stimulate feedings. Call for assistance as needed.  Patient Name: Boy Hannie Shoe [id number] Date: 09/07/2019 Reason for consult: Difficult latch   Maternal Data    Feeding Feeding Type: Breast Fed  LATCH Score Latch: Too sleepy or reluctant, no latch achieved, no sucking elicited.  Audible Swallowing: None  Type of Nipple: Flat  Comfort (Breast/Nipple): Soft / non-tender  Hold (Positioning): Full assist, staff holds infant at breast  LATCH Score: 3  Interventions Interventions: Breast feeding basics reviewed;Adjust position;Assisted with latch;Support pillows;Skin to skin;Position options;Breast massage;Hand express;Breast compression;Shells  Lactation Tools Discussed/Used Tools: Nipple Shields Nipple shield size: 20   Consult Status Consult Status: Follow-up Date: 09/07/19(in pm when interested in BF.) Follow-up type: In-patient    Ryana Montecalvo, Elta Guadeloupe 09/07/2019, 3:13 AM

## 2019-09-07 NOTE — Lactation Note (Signed)
This note was copied from a baby's chart. Lactation Consultation Note RN reported to [facility] that mom doesn't have a bra but needs to wear the shells. LC gave mom a belly band to wear. Mom applied shells. Mom will call for assistance for next feeding.  Patient Name: [name] [id number] Date: 09/07/2019     Maternal Data    Feeding Feeding Type: Breast Milk  LATCH Score Latch: Repeated attempts needed to sustain latch, nipple held in mouth throughout feeding, stimulation needed to elicit sucking reflex.  Audible Swallowing: None  Type of Nipple: Flat  Comfort (Breast/Nipple): Soft / non-tender  Hold (Positioning): Assistance needed to correctly position infant at breast and maintain latch.  LATCH Score: 5  Interventions    Lactation Tools Discussed/Used Tools: Nipple Shields Nipple shield size: 20   Consult Status      [name] G 09/07/2019, 12:06 AM

## 2019-09-07 NOTE — Progress Notes (Signed)
[facility]) Care Management  09/07/2019  [name] Jan 05, 1991 [id number]   [facility]) Care Management  09/07/2019  [name] 09/06/2019 [id number]   CSW received consult for hx of Anxiety and Depression.  CSW met with MOB, along with Eduard Clos, LCSW colleague, to offer support and complete assessment.    Father of baby also present during assessment completion. Verbal consent received from MOB for father of baby to be present.    MOB denies experiencing symptoms of anxiety and   depression at present, reporting that she "feels great".  MOB denied taking prescription medications for anxiety and depression, nor does MOB see a psychiatrist or therapist for symptom management.  MOB denied experiencing PPD with first pregnancy.    CSW provided education regarding the baby blues period vs. perinatal mood disorders, discussed treatment and gave resources for mental health follow up if concerns arise.  CSW recommends self-evaluation during the postpartum time period using the New Mom Checklist from Postpartum Progress and encouraged MOB to contact a medical professional if symptoms are noted at any time.   CSW provided review of Sudden Infant Death Syndrome (SIDS) precautions.   CSW identifies no further need for intervention and no barriers to discharge at this time.  Nat Christen, BSW, MSW, LCSW  Licensed Clinical Social Worker  [place]  Cell # [phone]  [email or web address]

## 2019-09-07 NOTE — Progress Notes (Signed)
Post Partum Day 1 Subjective: no complaints, up ad lib, voiding, tolerating PO, + flatus and bonding well with baby. Lochia mild. Denies fever/chills/SOB or HA  Objective: Blood pressure 108/79, pulse 77, temperature 98.7 F (37.1 C), temperature source Oral, resp. rate 18, height 5\' 5"  (1.651 m), weight 89.4 kg, last menstrual period 11/07/2018, SpO2 100 %, unknown if currently breastfeeding.  Physical Exam:  General: alert, cooperative and no distress Lochia: appropriate Uterine Fundus: firm Incision: n/a DVT Evaluation: No evidence of DVT seen on physical exam. No significant calf/ankle edema.  Recent Labs    09/05/19 2000 09/07/19 0503  HGB 10.2* 10.2*  HCT 31.8* 31.7*    Assessment/Plan: Plan for discharge tomorrow, Breastfeeding and Lactation consult  Circ in office   LOS: 2 days   Marston Mccadden W Dashauna Heymann 09/07/2019, 11:21 AM

## 2019-09-08 MED ORDER — PREPLUS 27-1 MG PO TABS: 1.0000 | ORAL_TABLET | Freq: Every day | ORAL | 3 refills | Status: DC

## 2019-09-08 MED ORDER — IBUPROFEN 600 MG PO TABS: 600.0000 mg | ORAL_TABLET | Freq: Four times a day (QID) | ORAL | 0 refills | Status: DC

## 2019-09-08 NOTE — Lactation Note (Signed)
This note was copied from a baby's chart. Lactation Consultation Note  Patient Name: [name] [id number] Date: 09/08/2019  P2, 38 hour female infant, -5% weight loss. Infant had 2 stools and 3 voids. Infant has been cluster feeding. LC did not observe latch mom had breastfed infant prior to [facility] entering the room. Per mom, breastfeeding is going well, infant is now latching 20 to 30 minutes most feedings. Mom has been wearing breast shells, in belly band given by LC. Mom knows to call Nurse or [place] if she has any questions, concerns or need assistance with latching infant to breast.   Maternal Data    Feeding Feeding Type: Breast Fed  LATCH Score Latch: Repeated attempts needed to sustain latch, nipple held in mouth throughout feeding, stimulation needed to elicit sucking reflex.  Audible Swallowing: Spontaneous and intermittent  Type of Nipple: Flat  Comfort (Breast/Nipple): Filling, red/small blisters or bruises, mild/mod discomfort  Hold (Positioning): Assistance needed to correctly position infant at breast and maintain latch.  LATCH Score: 6  Interventions Interventions: Adjust position;Skin to skin;Assisted with latch;Breast massage;Position options;Support pillows;Coconut oil  Lactation Tools Discussed/Used     Consult Status      [name] 09/08/2019, 12:39 AM

## 2019-09-08 NOTE — Discharge Summary (Signed)
OB Discharge Summary     Patient Name: [name] DOB: 10-Sep-1991 MRN: [id number]  Date of admission: 09/05/2019 Delivering MD: Carlynn Purl [facility]   Date of discharge: 09/08/2019  Admitting diagnosis: CTX dropping fetal heartrate Intrauterine pregnancy: [redacted]w[redacted]d     Secondary diagnosis:  Active Problems:   Indication for care in labor or delivery   SVD (spontaneous vaginal delivery)  Additional problems: fetal pelvic kidney     Discharge diagnosis: Term Pregnancy Delivered                                                                                                Post partum procedures:N/A  Augmentation: AROM and Pitocin  Complications: None  Hospital course:  Onset of Labor With Vaginal Delivery     28 y.o. yo [id number] at [redacted]w[redacted]d was admitted in Active Labor on 09/05/2019. Patient had an uncomplicated labor course as follows:  Membrane Rupture Time/Date: 11:23 PM ,09/05/2019   Intrapartum Procedures: Episiotomy: None [1]                                         Lacerations:  None [1]  Patient had a delivery of a Viable infant. 09/06/2019  Information for the patient's newborn:  Georgina, Krist [[id number]]  Delivery Method: [place] had an uncomplicated postpartum course.  She is ambulating, tolerating a regular diet, passing flatus, and urinating well. Patient is discharged home in stable condition on 09/08/19.   Physical exam  Vitals:   09/07/19 0559 09/07/19 0603 09/07/19 2138 09/08/19 0523  BP: (!) 98/56 108/79 118/75 122/76  Pulse: 77  89 86  Resp: 18  18   Temp: 98.7 F (37.1 C)  98 F (36.7 C) 98.2 F (36.8 C)  TempSrc: Oral  Oral Oral  SpO2:   100% 100%  Weight:      Height:       General: alert and no distress Lochia: appropriate Uterine Fundus: firm  Labs: Lab Results  Component Value Date   WBC 9.6 09/07/2019   HGB 10.2 (L) 09/07/2019   HCT 31.7 (L) 09/07/2019   MCV 93.0 09/07/2019   PLT 165 09/07/2019   CMP Latest Ref  Rng & Units 03/30/2017  Glucose 65 - 99 mg/dL 82  BUN 6 - 20 mg/dL 8  Creatinine 0.44 - 1.00 mg/dL 0.91  Sodium 135 - 145 mmol/L 135  Potassium 3.5 - 5.1 mmol/L 3.4(L)  Chloride 101 - 111 mmol/L 106  CO2 22 - 32 mmol/L 21(L)  Calcium 8.9 - 10.3 mg/dL 9.1  Total Protein 6.5 - 8.1 g/dL 6.6  Total Bilirubin 0.3 - 1.2 mg/dL 0.6  Alkaline Phos 38 - 126 U/L 34(L)  AST 15 - 41 U/L 17  ALT 14 - 54 U/L 12(L)    Discharge instruction: per After Visit Summary and "Baby and Me Booklet".  After visit meds:  Allergies as of 09/08/2019   No Known Allergies     Medication List  TAKE these medications   albuterol 108 (90 Base) MCG/ACT inhaler Commonly known as: VENTOLIN HFA Inhale 1-2 puffs into the lungs every 6 (six) hours as needed for wheezing or shortness of breath.   Breo Ellipta 100-25 MCG/INH Aepb Generic drug: fluticasone furoate-vilanterol Inhale 2 puffs into the lungs daily.   ibuprofen 600 MG tablet Commonly known as: ADVIL Take 1 tablet (600 mg total) by mouth every 6 (six) hours.   PrePLUS 27-1 MG Tabs Take 1 tablet by mouth daily.       Diet: routine diet  Activity: Advance as tolerated. Pelvic rest for 6 weeks.   Outpatient follow up:6 weeks Follow up Appt: Future Appointments  Date Time Provider Department Center  09/14/2019  8:00 AM [facility] 1 [facility] None   Follow up Visit:No follow-ups on file.  Postpartum contraception: Undecided  Newborn Data: Live born female  Birth Weight: 7 lb 10.4 oz (3470 g) APGAR: 9, 9  Newborn Delivery   Birth date/time: 09/06/2019 08:17:00 Delivery type: Vaginal, Spontaneous      Baby Feeding: Breast Disposition:home with mother   09/08/2019 Sherian Rein, MD

## 2019-09-08 NOTE — Progress Notes (Signed)
Post Partum Day 2 Subjective: no complaints, up ad lib, voiding, tolerating PO and nl lochia, pain controlled  Objective: Blood pressure 122/76, pulse 86, temperature 98.2 F (36.8 C), temperature source Oral, resp. rate 18, height 5\' 5"  (1.651 m), weight 89.4 kg, last menstrual period 11/07/2018, SpO2 100 %, unknown if currently breastfeeding.  Physical Exam:  General: alert and no distress Lochia: appropriate Uterine Fundus: firm   Recent Labs    09/05/19 2000 09/07/19 0503  HGB 10.2* 10.2*  HCT 31.8* 31.7*    Assessment/Plan: Discharge home, Breastfeeding and Lactation consult.  Routine PP care.  D/C with Motrin and PNV.  F/u 6 weeks   LOS: 3 days   Macsen Nuttall Bovard-Stuckert 09/08/2019, 7:41 AM

## 2019-09-14 ENCOUNTER — Other Ambulatory Visit (HOSPITAL_COMMUNITY)
Admission: RE | Admit: 2019-09-14 | Discharge: 2019-09-14 | Disposition: A | Discharge status: Home / Self Care | Payer: Medicaid Other | Source: Ambulatory Visit | Attending: Family Medicine | Admitting: Family Medicine

## 2019-09-16 ENCOUNTER — Inpatient Hospital Stay (HOSPITAL_COMMUNITY)
Admission: AD | Admit: 2019-09-16 | Payer: Medicaid Other | Source: Home / Self Care | Admitting: Obstetrics and Gynecology

## 2019-09-16 ENCOUNTER — Inpatient Hospital Stay (HOSPITAL_COMMUNITY): Payer: Medicaid Other

## 2019-10-23 ENCOUNTER — Other Ambulatory Visit: Payer: Self-pay

## 2019-10-23 DIAGNOSIS — Z20822 Contact with and (suspected) exposure to covid-19: Secondary | ICD-10-CM

## 2019-10-25 LAB — NOVEL CORONAVIRUS, NAA: SARS-CoV-2, NAA: NOT DETECTED

## 2020-01-26 ENCOUNTER — Telehealth: Payer: Medicaid Other | Admitting: Physician Assistant

## 2020-01-26 ENCOUNTER — Ambulatory Visit (INDEPENDENT_AMBULATORY_CARE_PROVIDER_SITE_OTHER)
Admission: RE | Admit: 2020-01-26 | Discharge: 2020-01-26 | Disposition: A | Discharge status: Home / Self Care | Payer: Medicaid Other | Source: Ambulatory Visit

## 2020-01-26 ENCOUNTER — Inpatient Hospital Stay: Admit: 2020-01-26 | Discharge: 2020-01-26 | Disposition: A | Discharge status: Home / Self Care | Payer: Medicaid Other

## 2020-01-26 ENCOUNTER — Telehealth: Payer: Medicaid Other

## 2020-01-26 DIAGNOSIS — L309 Dermatitis, unspecified: Secondary | ICD-10-CM

## 2020-01-26 DIAGNOSIS — R21 Rash and other nonspecific skin eruption: Secondary | ICD-10-CM | POA: Diagnosis not present

## 2020-01-26 MED ORDER — PERMETHRIN 5 % EX CREA: TOPICAL_CREAM | CUTANEOUS | 1 refills | Status: DC

## 2020-01-26 MED ORDER — HYDROCORTISONE 1 % EX LOTN: 1.0000 "application " | TOPICAL_LOTION | Freq: Two times a day (BID) | CUTANEOUS | 0 refills | Status: DC

## 2020-01-26 NOTE — Progress Notes (Signed)
E Visit for Rash  We are sorry that you are not feeling well. Here is how we plan to help!  It is difficult to tell your exact rash without a photo or further description  Based on what you shared with me it looks like you have contact dermatitis.  Contact dermatitis is a skin rash caused by something that touches the skin and causes irritation or inflammation.  Your skin may be red, swollen, dry, cracked, and itch.  The rash should go away in a few days but can last a few weeks.  If you get a rash, it's important to figure out what caused it so the irritant can be avoided in the future.  I will prescribe you a steroid cream. If this is not helpful then please make an in person visit for further evaluation.   HOME CARE:   Take cool showers and avoid direct sunlight.  Apply cool compress or wet dressings.  Take a bath in an oatmeal bath.  Sprinkle content of one Aveeno packet under running faucet with comfortably warm water.  Bathe for 15-20 minutes, 1-2 times daily.  Pat dry with a towel. Do not rub the rash.  Take an antihistamine like Benadryl for widespread rashes that itch.  The adult dose of Benadryl is 25-50 mg by mouth 4 times daily.  Caution:  This type of medication may cause sleepiness.  Do not drink alcohol, drive, or operate dangerous machinery while taking antihistamines.  Do not take these medications if you have prostate enlargement.  Read package instructions thoroughly on all medications that you take.  GET HELP RIGHT AWAY IF:   Symptoms don't go away after treatment.  Severe itching that persists.  If you rash spreads or swells.  If you rash begins to smell.  If it blisters and opens or develops a yellow-brown crust.  You develop a fever.  You have a sore throat.  You become short of breath.  MAKE SURE YOU:  Understand these instructions. Will watch your condition. Will get help right away if you are not doing well or get worse.  Thank you for  choosing an e-visit. Your e-visit answers were reviewed by a board certified advanced clinical practitioner to complete your personal care plan. Depending upon the condition, your plan could have included both over the counter or prescription medications. Please review your pharmacy choice. Be sure that the pharmacy you have chosen is open so that you can pick up your prescription now.  If there is a problem you may message your provider in MyChart to have the prescription routed to another pharmacy. Your safety is important to [place]. If you have drug allergies check your prescription carefully.  For the next 24 hours, you can use MyChart to ask questions about today's visit, request a non-urgent call back, or ask for a work or school excuse from your e-visit provider. You will get an email in the next two days asking about your experience. I hope that your e-visit has been valuable and will speed your recovery.     5 minutes spent on chart

## 2020-01-26 NOTE — Discharge Instructions (Signed)
Treating you for scabies Take the medication as prescribed  Follow up as needed for continued or worsening symptoms

## 2020-01-26 NOTE — ED Provider Notes (Signed)
Virtual Visit via Video Note:  [name]  initiated request for Telemedicine visit with [facility] Urgent Care team. I connected with [name]  on 01/26/2020 at 11:54 AM  for a synchronized telemedicine visit using a video enabled HIPPA compliant telemedicine application. I verified that I am speaking with [name]  using two identifiers. Janace Aris, NP  was physically located in a [facility] Urgent care site and TYKESHIA TOURANGEAU was located at a different location.   The limitations of evaluation and management by telemedicine as well as the availability of in-person appointments were discussed. Patient was informed that she  may incur a bill ( including co-pay) for this virtual visit encounter. Trenace R Ast  expressed understanding and gave verbal consent to proceed with virtual visit.     History of Present Illness:[name]  is a 29 y.o. female presents with rash.  This is been present and spreading over the past week.  The rash is located to her buttocks area and inner thighs and abdomen.  The rash is very itchy and worse at night.  Her son also has similar rash.  They sleep in the same bed together.  Did change her laundry soap detergent recently.  Denies any fever, joint pain.No recent travel.  Patient has been outside but denies any contact with plants or insects. No new foods or medications.  She took a Benadryl earlier today with some relief of the itching.    Past Medical History:  Diagnosis Date  . Anemia   . Asthma   . Depression   . Generalized anxiety disorder   . GERD (gastroesophageal reflux disease)   . History of chlamydia     No Known Allergies      Observations/Objective:VITALS: Per patient if applicable, see vitals. GENERAL: Alert, appears well and in no acute distress. HEENT: Atraumatic, conjunctiva clear, no obvious abnormalities on inspection of external nose and ears. NECK: Normal movements of the head and neck. CARDIOPULMONARY: No  increased WOB. Speaking in clear sentences. I:E ratio WNL.  MS: Moves all visible extremities without noticeable abnormality. PSYCH: Pleasant and cooperative, well-groomed. Speech normal rate and rhythm. Affect is appropriate. Insight and judgement are appropriate. Attention is focused, linear, and appropriate.  NEURO: CN grossly intact. Oriented as arrived to appointment on time with no prompting. Moves both UE equally.  SKIN: papular rash to inner thighs and abdomen   Assessment and Plan: Rash consistent with scabies.  Will treat with permethrin cream.  Benadryl as needed for itching Follow up as needed for continued or worsening symptoms    Follow Up Instructions:Follow up as needed for continued or worsening symptoms    I discussed the assessment and treatment plan with the patient. The patient was provided an opportunity to ask questions and all were answered. The patient agreed with the plan and demonstrated an understanding of the instructions.   The patient was advised to call back or seek an in-person evaluation if the symptoms worsen or if the condition fails to improve as anticipated.    Janace Aris, NP  01/26/2020 11:54 AM         Janace Aris, NP 01/26/20 1154

## 2020-02-10 IMAGING — US US OB < 14 WEEKS - US OB TV
1 series · 15 of 28 positions shown · non-contrast
Comparison: None.

CLINICAL DATA: Bleeding

EXAM:
OBSTETRIC <14 WK US AND TRANSVAGINAL OB US
TECHNIQUE: Both transabdominal and transvaginal ultrasound examinations were
performed for complete evaluation of the gestation as well as the
maternal uterus, adnexal regions, and pelvic cul-de-sac.
Transvaginal technique was performed to assess early pregnancy.

[Series 1: us ob < 14 weeks - us ob tv · 15 of 44 slices shown]
[im 1/44]
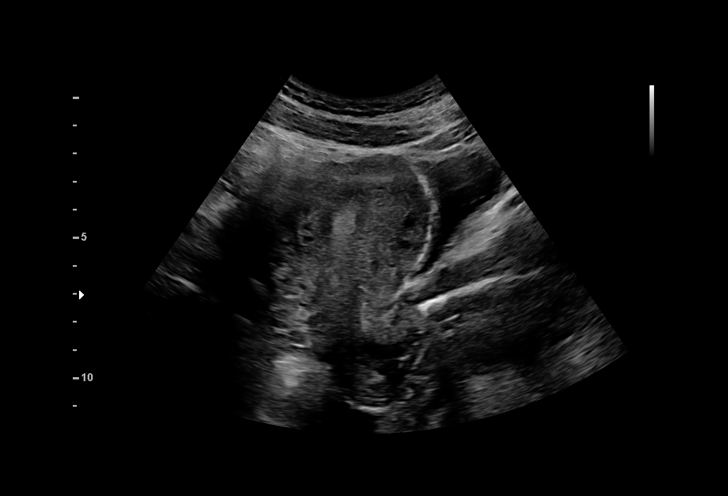
[im 4/44]
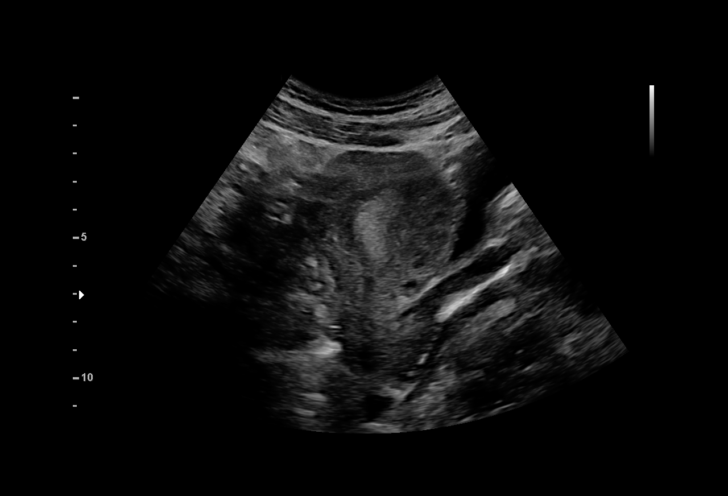
[im 7/44]
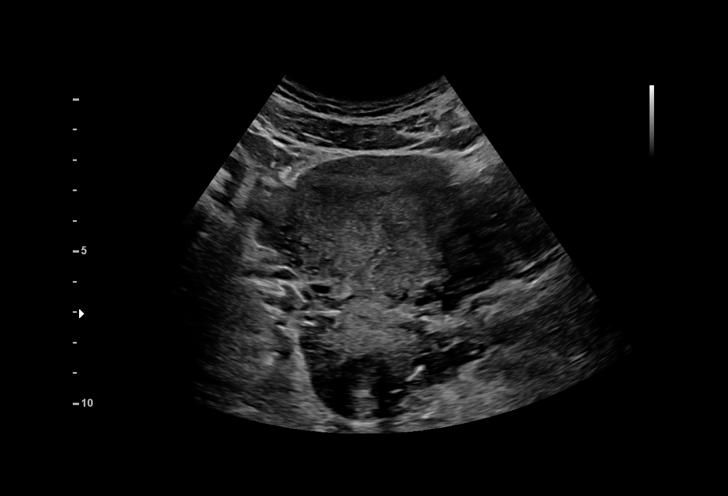
[im 10/44]
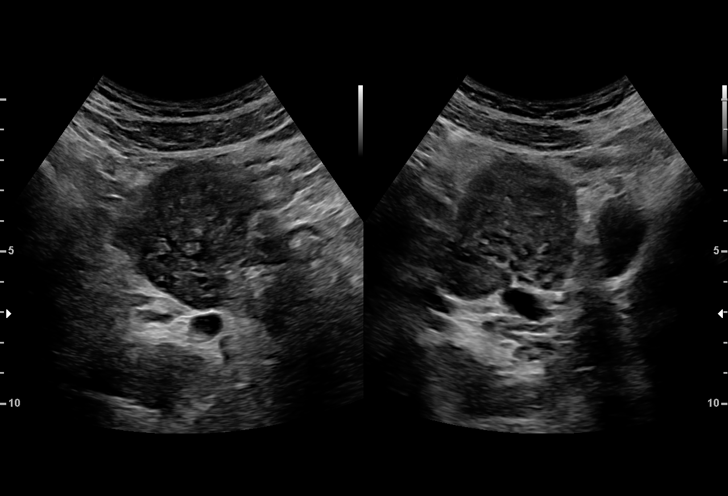
[im 13/44]
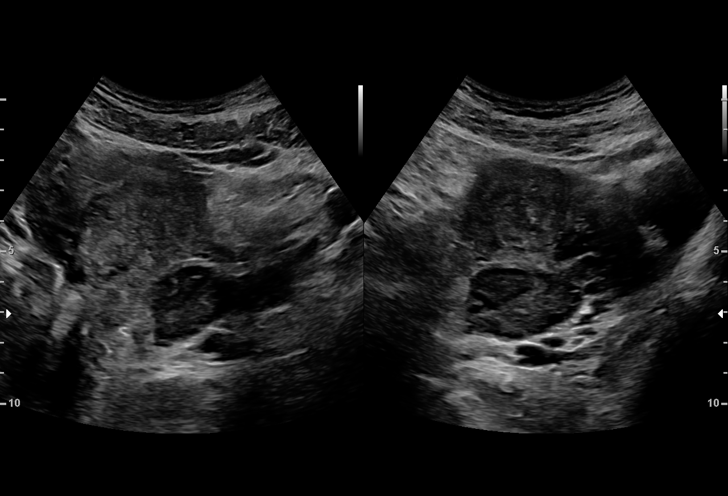
[im 16/44]
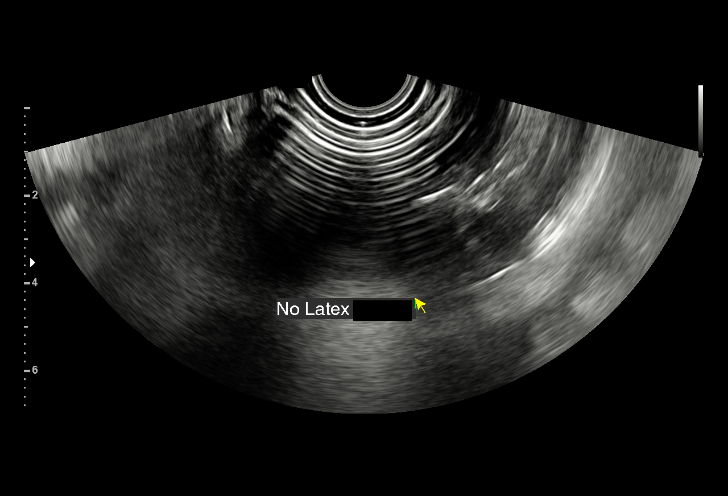
[im 20/44]
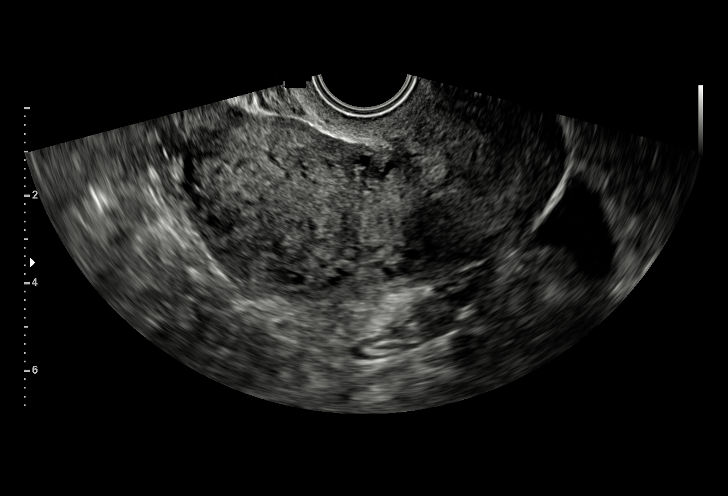
[im 23/44]
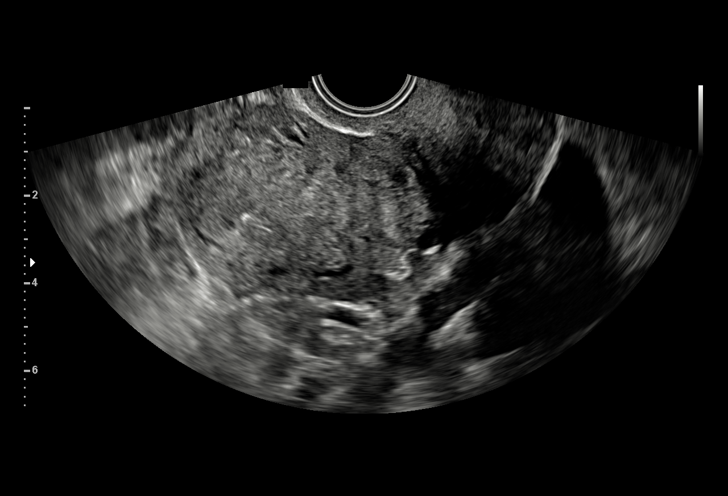
[im 24/44]
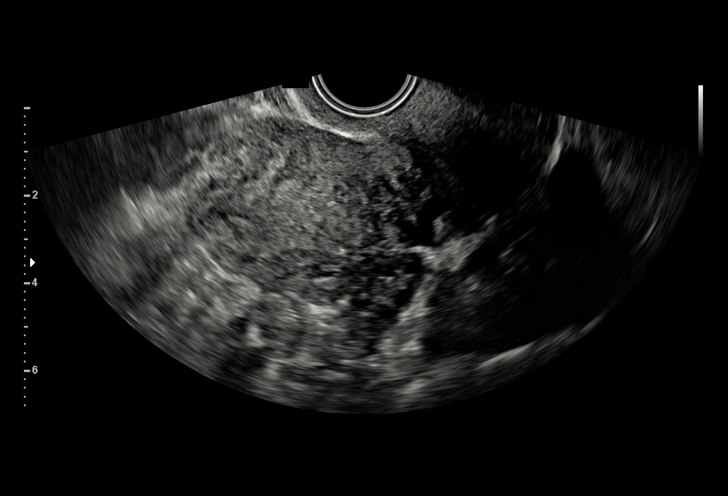
[im 28/44]
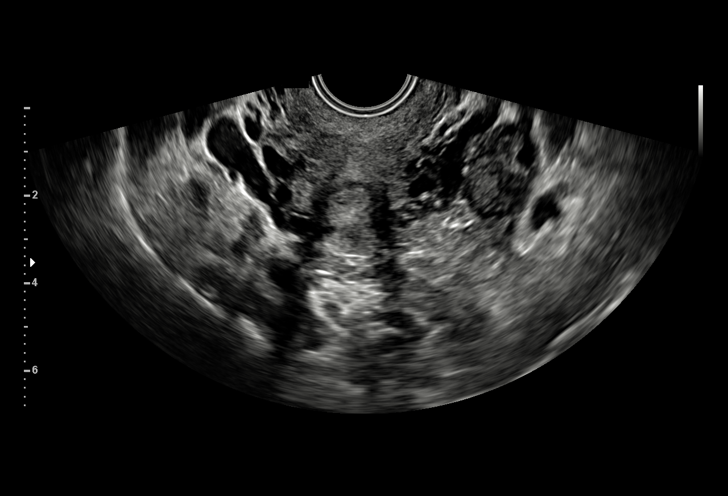
[im 31/44]
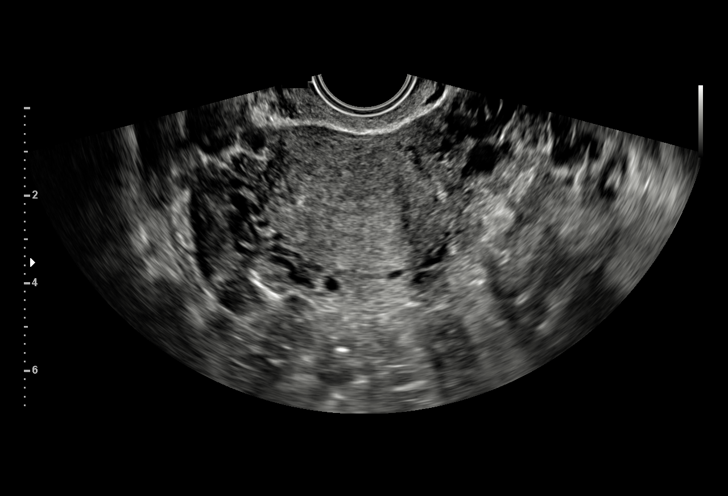
[im 34/44]
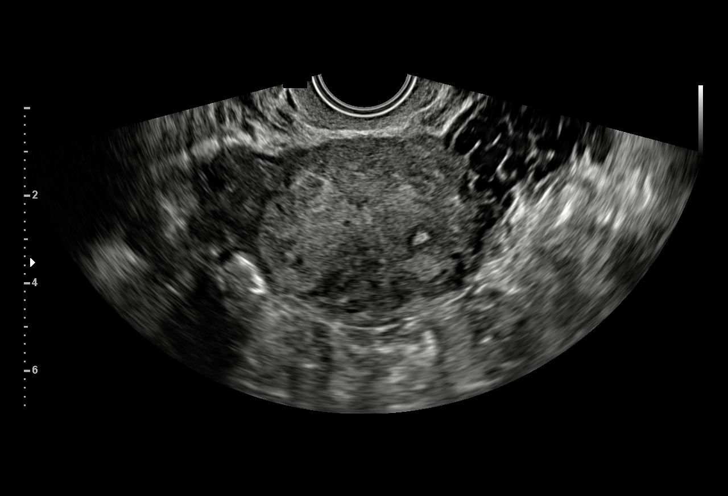
[im 37/44]
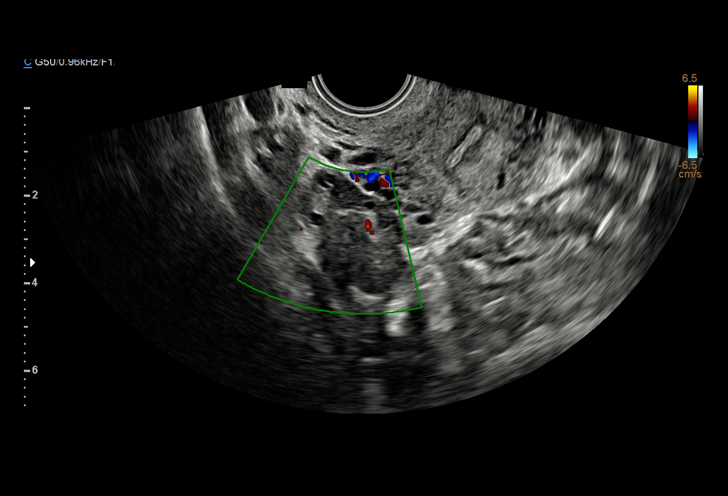
[im 40/44]
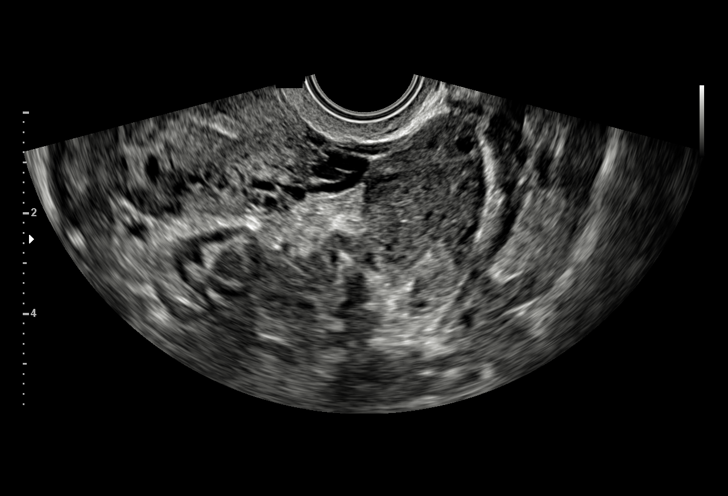
[im 44/44]
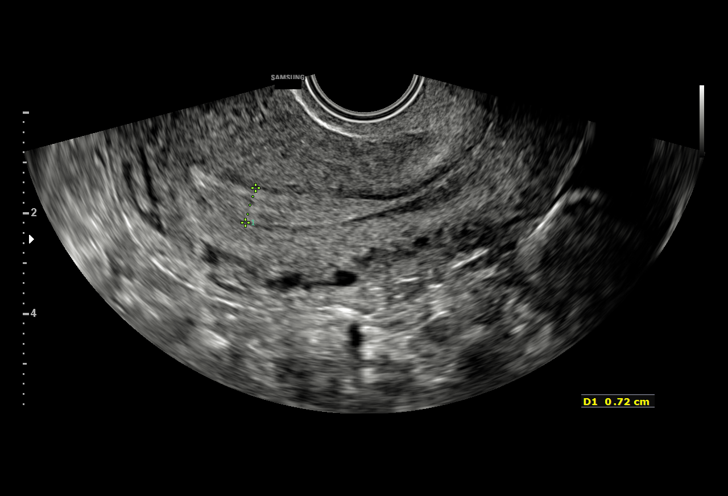

[15 of 28 positions shown; findings below may reference images not displayed]

FINDINGS: Intrauterine gestational sac: None

Yolk sac:  Not visualized

Embryo:  Not visualized

Cardiac Activity: Not visualized

Heart Rate:   bpm

MSD:   mm    w     d

CRL:    mm    w    d                  US EDC:

Subchorionic hemorrhage:  None visualized.

Maternal uterus/adnexae: No adnexal mass or free fluid.
IMPRESSION: No intrauterine pregnancy visualized. Differential considerations
would include early intrauterine pregnancy too early to visualize,
spontaneous abortion, or occult ectopic pregnancy. Recommend close
clinical followup and serial quantitative beta HCGs and ultrasounds.

## 2020-04-05 ENCOUNTER — Other Ambulatory Visit: Payer: Medicaid Other

## 2020-05-07 ENCOUNTER — Telehealth: Payer: Medicaid Other

## 2020-05-07 ENCOUNTER — Inpatient Hospital Stay: Admission: RE | Admit: 2020-05-07 | Payer: Medicaid Other | Source: Ambulatory Visit

## 2020-05-07 ENCOUNTER — Ambulatory Visit (INDEPENDENT_AMBULATORY_CARE_PROVIDER_SITE_OTHER)
Admission: RE | Admit: 2020-05-07 | Discharge: 2020-05-07 | Disposition: A | Discharge status: Other Acute Inpatient Hospital | Payer: Medicaid Other | Source: Ambulatory Visit

## 2020-05-07 ENCOUNTER — Ambulatory Visit
Admission: RE | Admit: 2020-05-07 | Discharge: 2020-05-07 | Discharge status: Absent without leave | Payer: Medicaid Other | Source: Ambulatory Visit

## 2020-05-07 DIAGNOSIS — J029 Acute pharyngitis, unspecified: Secondary | ICD-10-CM

## 2020-05-07 NOTE — Discharge Instructions (Addendum)
Come to the Urgent Care for in-person evaluation of your symptoms.     

## 2020-05-07 NOTE — ED Provider Notes (Signed)
Virtual Visit via Video Note:  [name]  initiated request for Telemedicine visit with [facility] Urgent Care team. I connected with [name]  on 05/07/2020 at 1:28 PM  for a synchronized telemedicine visit using a video enabled HIPPA compliant telemedicine application. I verified that I am speaking with [name]  using two identifiers. Mickie Bail, NP  was physically located in a [facility] Urgent care site and CRYSTALL DONALDSON was located at a different location.   The limitations of evaluation and management by telemedicine as well as the availability of in-person appointments were discussed. Patient was informed that she  may incur a bill ( including co-pay) for this virtual visit encounter. Jyrah R Shadrick  expressed understanding and gave verbal consent to proceed with virtual visit.     History of Present Illness:[name]  is a 29 y.o. female presents for evaluation of 2 week history of cough productive of yellow phlegm.  She also reports hoarse voice and sore throat x 3 days.  "Feels like swallowing glass".  No fever, chills, shortness of breath, vomiting, diarrhea, rash, or other symptoms.  Her children have had similar symptoms.  She denies current pregnancy or breastfeeding.      No Known Allergies   Past Medical History:  Diagnosis Date  . Anemia   . Asthma   . Depression   . Generalized anxiety disorder   . GERD (gastroesophageal reflux disease)   . History of chlamydia      Social History   Tobacco Use  . Smoking status: Current Every Day Smoker    Packs/day: 0.25    Years: 2.00    Pack years: 0.50    Types: Cigarettes  . Smokeless tobacco: Never Used  Substance Use Topics  . Alcohol use: No  . Drug use: No   ROS: as stated in HPI.  All other systems reviewed and negative.      Observations/Objective: Physical Exam  VITALS: Patient denies fever. GENERAL: Alert, appears well and in no acute distress. HEENT: Atraumatic.  Voice  hoarse.  NECK: Normal movements of the head and neck. CARDIOPULMONARY: No increased WOB. Speaking in clear sentences. I:E ratio WNL.  MS: Moves all visible extremities without noticeable abnormality. PSYCH: Pleasant and cooperative, well-groomed. Speech normal rate and rhythm. Affect is appropriate. Insight and judgement are appropriate. Attention is focused, linear, and appropriate.  NEURO: CN grossly intact. Oriented as arrived to appointment on time with no prompting. Moves both UE equally.  SKIN: No obvious lesions, wounds, erythema, or cyanosis noted on face or hands.   Assessment and Plan:    ICD-10-CM   1. Sore throat  J02.9        Follow Up Instructions: Discussed with patient the need for in-person visit due to duration and nature of her symptoms.  She agree to come to [facility] for evaluation.      I discussed the assessment and treatment plan with the patient. The patient was provided an opportunity to ask questions and all were answered. The patient agreed with the plan and demonstrated an understanding of the instructions.   The patient was advised to call back or seek an in-person evaluation if the symptoms worsen or if the condition fails to improve as anticipated.      Mickie Bail, NP  05/07/2020 1:28 PM         Mickie Bail, NP 05/07/20 1328

## 2020-06-16 ENCOUNTER — Ambulatory Visit (INDEPENDENT_AMBULATORY_CARE_PROVIDER_SITE_OTHER)
Admission: RE | Admit: 2020-06-16 | Discharge: 2020-06-16 | Disposition: A | Discharge status: Home / Self Care | Payer: Medicaid Other | Source: Ambulatory Visit

## 2020-06-16 ENCOUNTER — Other Ambulatory Visit: Payer: Self-pay

## 2020-06-16 DIAGNOSIS — W57XXXA Bitten or stung by nonvenomous insect and other nonvenomous arthropods, initial encounter: Secondary | ICD-10-CM | POA: Diagnosis not present

## 2020-06-16 DIAGNOSIS — L03113 Cellulitis of right upper limb: Secondary | ICD-10-CM

## 2020-06-16 MED ORDER — DOXYCYCLINE HYCLATE 100 MG PO CAPS: 100.0000 mg | ORAL_CAPSULE | Freq: Two times a day (BID) | ORAL | 0 refills | Status: DC

## 2020-06-16 MED ORDER — FLUCONAZOLE 150 MG PO TABS: ORAL_TABLET | ORAL | 0 refills | Status: DC

## 2020-06-16 NOTE — Discharge Instructions (Signed)
Sent doxycycline to your pharmacy  Sent diflucan to your pharmacy as well  Let us know if you're not getting better towards the end of your doxycycline course  If you are having any symptoms we can test you in 2 more weeks for Lyme disease or [facility] spotted fever  Follow-up with primary care as needed

## 2020-06-16 NOTE — ED Provider Notes (Signed)
[facility] CARE CENTER  Virtual Visit via Video Note:  [name]  initiated request for Telemedicine visit with [facility] Urgent Care team. I connected with [name]  on 06/16/2020 at 1:09 PM  for a synchronized telemedicine visit using a video enabled HIPPA compliant telemedicine application. I verified that I am speaking with [name]  using two identifiers. Marykay Lex, NP  was physically located in a [facility] Urgent care site and [name] was located at a different location.   The limitations of evaluation and management by telemedicine as well as the availability of in-person appointments were discussed. Patient was informed that she  may incur a bill ( including co-pay) for this virtual visit encounter. [name]  expressed understanding and gave verbal consent to proceed with virtual visit.   [id number] 06/16/20 Arrival Time: 1257  ZT:IWPY COMPLAINT  SUBJECTIVE: History from: patient.  [name] is a 29 y.o. female who presents with abrupt onset of a tick bite with itching, redness, swelling at the site.  Reports that this happened 2 weeks ago.  Reports that this is never happened to her before.  There are no aggravating or alleviating symptoms.  She has not tried anything over-the-counter to try to treat her bite.  Denies symptoms such as fatigue, nausea, headaches, joint pain, muscle aches at this time.   ROS: As per HPI.  All other pertinent ROS negative.     Past Medical History:  Diagnosis Date  . Anemia   . Asthma   . Depression   . Generalized anxiety disorder   . GERD (gastroesophageal reflux disease)   . History of chlamydia    Past Surgical History:  Procedure Laterality Date  . DILATION AND CURETTAGE OF UTERUS     No Known Allergies No current facility-administered medications on file prior to encounter.   Current Outpatient Medications on File Prior to Encounter  Medication Sig Dispense Refill  . albuterol  (PROVENTIL HFA;VENTOLIN HFA) 108 (90 Base) MCG/ACT inhaler Inhale 1-2 puffs into the lungs every 6 (six) hours as needed for wheezing or shortness of breath.    . fluticasone furoate-vilanterol (BREO ELLIPTA) 100-25 MCG/INH AEPB Inhale 2 puffs into the lungs daily.    . hydrocortisone 1 % lotion Apply 1 application topically 2 (two) times daily. 118 mL 0  . ibuprofen (ADVIL) 600 MG tablet Take 1 tablet (600 mg total) by mouth every 6 (six) hours. 30 tablet 0  . permethrin (ELIMITE) 5 % cream Apply to affected area once 60 g 1  . Prenatal Vit-Fe Fumarate-FA (PREPLUS) 27-1 MG TABS Take 1 tablet by mouth daily. 100 tablet 3   Social History   Socioeconomic History  . Marital status: Single    Spouse name: Not on file  . Number of children: Not on file  . Years of education: Not on file  . Highest education level: Not on file  Occupational History  . Not on file  Tobacco Use  . Smoking status: Current Every Day Smoker    Packs/day: 0.25    Years: 2.00    Pack years: 0.50    Types: Cigarettes  . Smokeless tobacco: Never Used  Substance and Sexual Activity  . Alcohol use: No  . Drug use: No  . Sexual activity: Yes    Partners: Male    Birth control/protection: None  Other Topics Concern  . Not on file  Social History Narrative  . Not on file  Social Determinants of Health   Financial Resource Strain:   . Difficulty of Paying Living Expenses:   Food Insecurity:   . Worried About Programme researcher, broadcasting/film/video in the Last Year:   . Barista in the Last Year:   Transportation Needs:   . Freight forwarder (Medical):   Marland Kitchen Lack of Transportation (Non-Medical):   Physical Activity:   . Days of Exercise per Week:   . Minutes of Exercise per Session:   Stress:   . Feeling of Stress :   Social Connections:   . Frequency of Communication with Friends and Family:   . Frequency of Social Gatherings with Friends and Family:   . Attends Religious Services:   . Active Member of Clubs  or Organizations:   . Attends Banker Meetings:   Marland Kitchen Marital Status:   Intimate Partner Violence:   . Fear of Current or Ex-Partner:   . Emotionally Abused:   Marland Kitchen Physically Abused:   . Sexually Abused:    Family History  Problem Relation Age of Onset  . Anesthesia problems Mother   . Arthritis Mother   . Asthma Brother   . Seizures Maternal Aunt   . Anesthesia problems Maternal Aunt   . Hypertension Paternal Aunt   . Cancer Paternal Aunt        lung  . Hypertension Paternal Uncle   . Cancer Maternal Grandmother        cervix  . Seizures Maternal Grandmother   . Hypertension Maternal Grandfather   . Cancer Maternal Grandfather        prostate  . Hypertension Paternal Grandfather   . Mental retardation Cousin        cp x2    OBJECTIVE:   There were no vitals filed for this visit.  General appearance: alert; no distress Eyes: EOMI grossly HENT: normocephalic; atraumatic Neck: supple with FROM Lungs: normal respiratory effort; speaking in full sentences without difficulty Extremities: moves extremities without difficulty Skin: No obvious rashes, about half centimeter circular erythematous area with center point consistent with a tick bite Neurologic: No facial asymmetries Psychological: alert and cooperative; normal mood and affect  ASSESSMENT & PLAN:  1. Cellulitis of right upper extremity   2. Tick bite, initial encounter     Meds ordered this encounter  Medications  . doxycycline (VIBRAMYCIN) 100 MG capsule    Sig: Take 1 capsule (100 mg total) by mouth 2 (two) times daily.    Dispense:  14 capsule    Refill:  0    Order Specific Question:   Supervising Provider    Answer:   Merrilee Jansky [id number]  . fluconazole (DIFLUCAN) 150 MG tablet    Sig: Take one tablet at the onset of symptoms, if still having symptoms in 3 days, take the second tablet.    Dispense:  2 tablet    Refill:  0    Order Specific Question:   Supervising Provider     Answer:   Merrilee Jansky [id number]    Doxycycline twice a day for 7 days Fluconazole in case of yeast infection If symptoms are not improving near the end of your course of doxycycline, follow-up in the office to be seen Can do antibody testing in about 2 weeks for Lyme and [facility] spotted fever Take OTC ibuprofen or tylenol as needed for pain Follow up with PCP if symptoms persists Return or go to ER if patient has any new or worsening  symptoms such as fever, chills, nausea, vomiting, worsening sore throat, cough, abdominal pain, chest pain, changes in bowel or bladder habits, etc...  I discussed the assessment and treatment plan with the patient. The patient was provided an opportunity to ask questions and all were answered. The patient agreed with the plan and demonstrated an understanding of the instructions.   The patient was advised to call back or seek an in-person evaluation if the symptoms worsen or if the condition fails to improve as anticipated.  I provided 10 minutes of non-face-to-face time during this encounter.  Marykay Lex, NP  06/16/2020 1:09 PM         Moshe Cipro, NP 06/16/20 1309

## 2020-08-31 ENCOUNTER — Other Ambulatory Visit: Payer: Medicaid Other

## 2020-09-13 IMAGING — US US MFM OB DETAIL +14 WK
1 series · 16 of 28 positions shown · non-contrast
Comparison: none

[Series 1: us mfm ob detail +14 wk · 101 acquisitions, 16 frames shown]
[im 1/101]
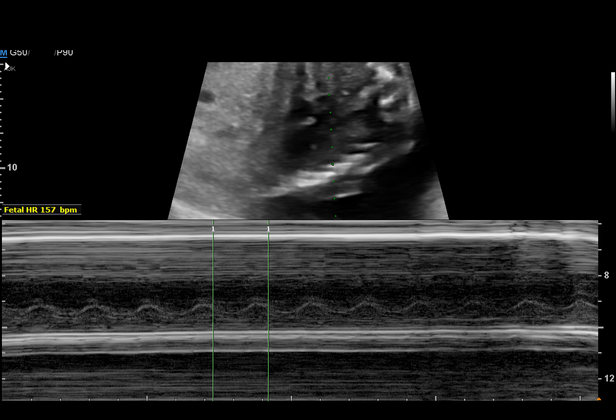
[im 8/101]
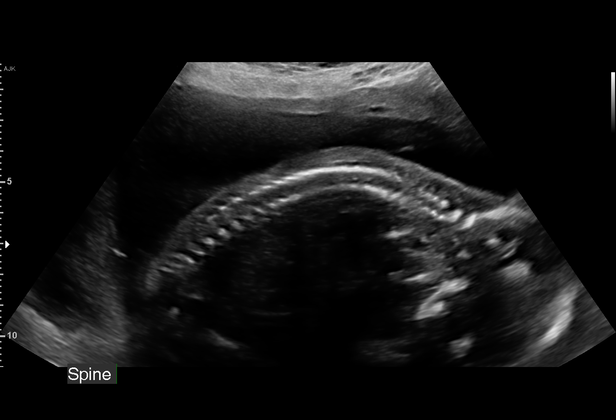
[im 15/101]
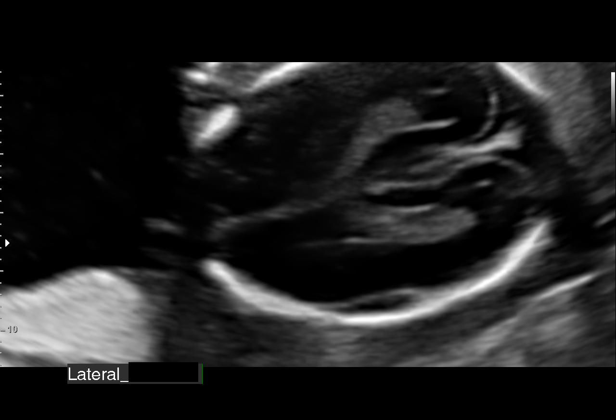
[im 23/101]
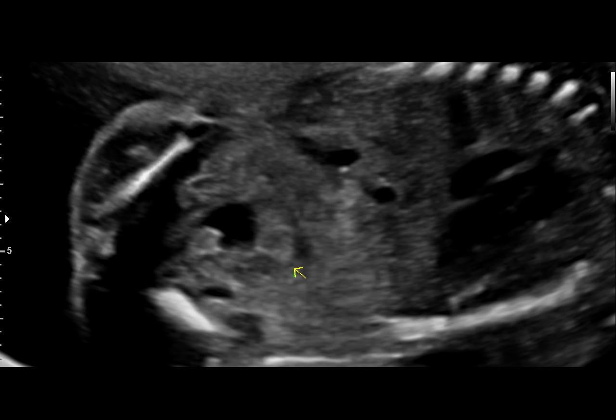
[im 26/101]
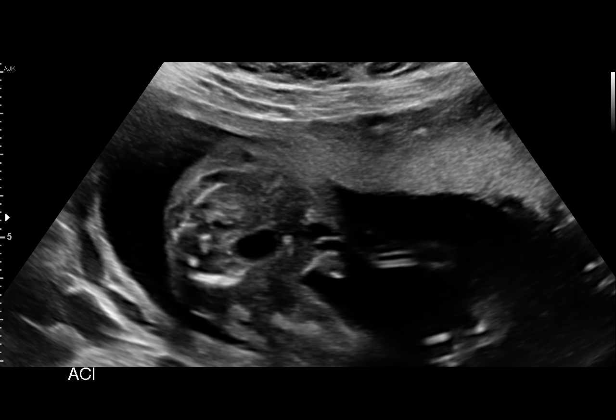
[im 34/101]
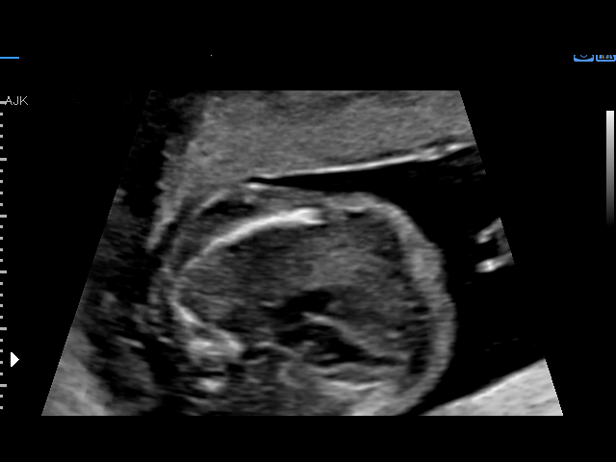
[im 41/101]
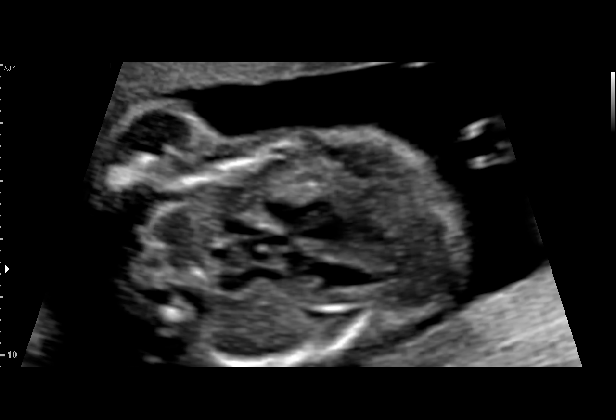
[im 49/101]
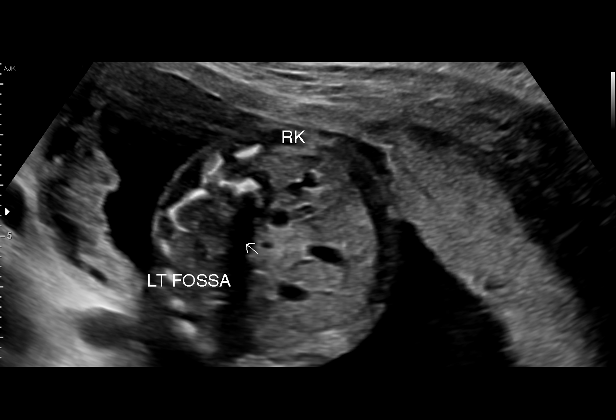
[im 52/101]
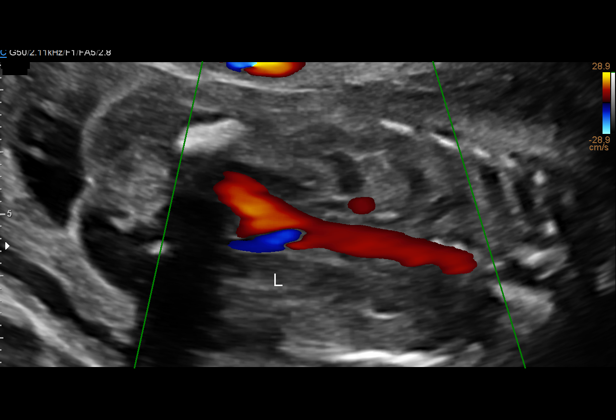
[im 60/101]
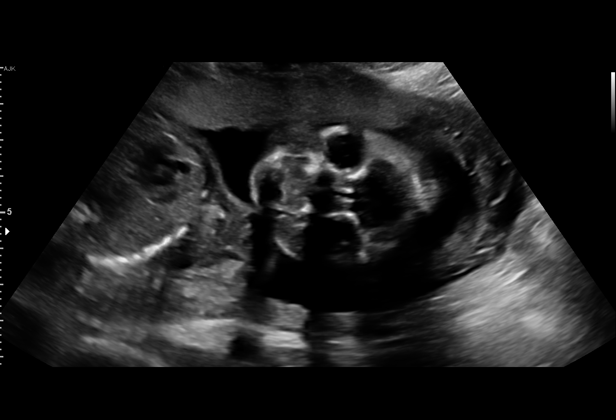
[im 67/101]
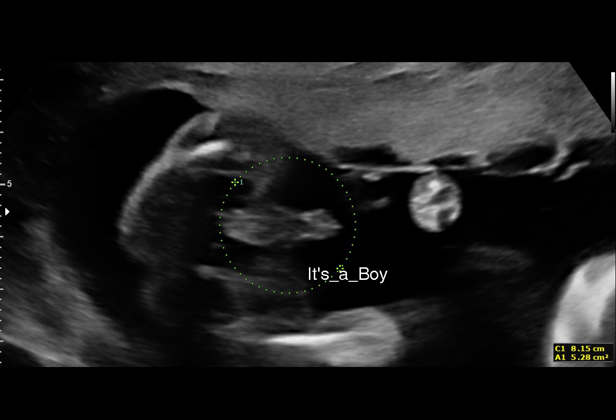
[im 75/101]
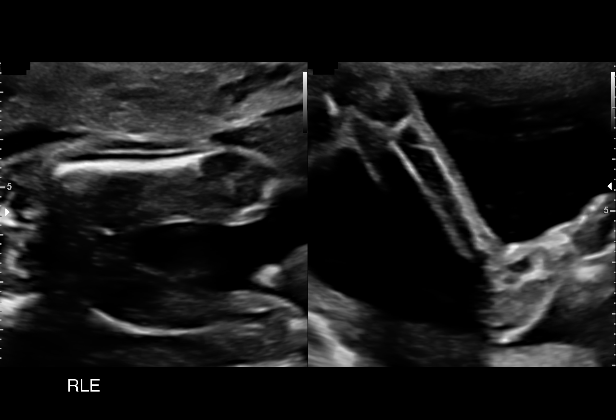
[im 78/101]
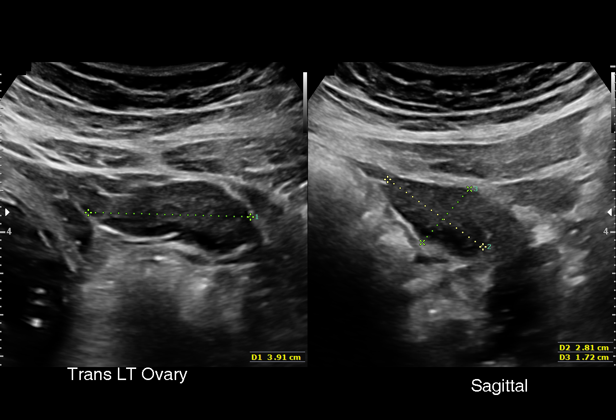
[im 86/101]
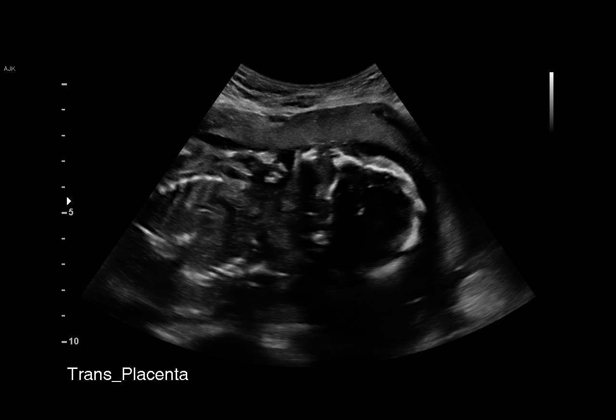
[im 93/101]
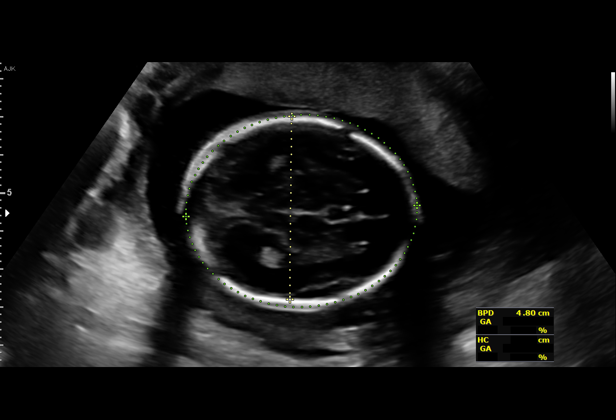
[im 101/101]
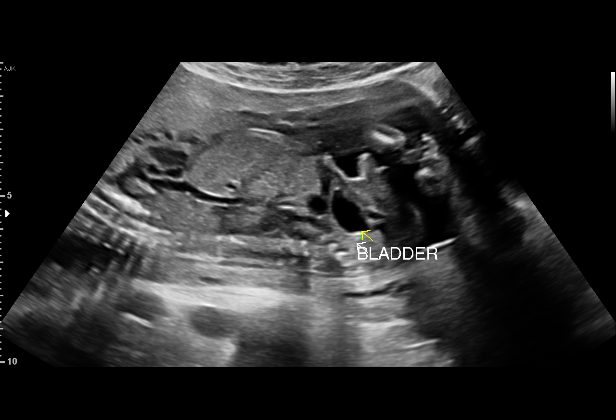

[16 of 28 positions shown; findings below may reference images not displayed]

OBGYN
                                                            [REDACTED]
 Referred By:      Shalihin Dran

                                                       CATALINO TOLER
 ----------------------------------------------------------------------

 ----------------------------------------------------------------------
Indications

  Fetal abnormality - other known or
  suspected (left pelvic kidney)
  Encounter for antenatal screening for
  malformations
  Poor obstetric history: Previous
  preeclampsia / eclampsia/gestational HTN
  20 weeks gestation of pregnancy
 ----------------------------------------------------------------------
Vital Signs

 BMI:
Fetal Evaluation

 Num Of Fetuses:         1
 Fetal Heart Rate(bpm):  157
 Cardiac Activity:       Observed
 Presentation:           Variable
 Placenta:               Anterior
 P. Cord Insertion:      Visualized, central

 Amniotic Fluid
 AFI FV:      Within normal limits

                             Largest Pocket(cm)

Biometry
 BPD:        48  mm     G. Age:  20w 4d         59  %    CI:        75.25   %    70 - 86
                                                         FL/HC:      18.5   %    16.8 -
 HC:      175.5  mm     G. Age:  20w 0d         32  %    HC/AC:      1.05        1.09 -
 AC:      166.7  mm     G. Age:  21w 5d         84  %    FL/BPD:     67.5   %
 FL:       32.4  mm     G. Age:  20w 1d         36  %    FL/AC:      19.4   %    20 - 24
 HUM:      33.2  mm     G. Age:  21w 1d         76  %
 CER:      21.4  mm     G. Age:  20w 3d         52  %

 LV:          6  mm
 CM:        4.7  mm

 Est. FW:     383  gm    0 lb 14 oz      56  %
OB History

 Gravidity:    5         Term:   1         SAB:   2
 TOP:          1        Living:  1
Gestational Age

 LMP:           26w 0d        Date:  11/07/18                 EDD:   08/14/19
 U/S Today:     20w 4d                                        EDD:   09/21/19
 Best:          20w 2d     Det. By:  Early Ultrasound         EDD:   09/23/19
                                     (02/13/19)
Anatomy

 Cranium:               Appears normal         Aortic Arch:            Appears normal
 Cavum:                 Appears normal         Ductal Arch:            Appears normal
 Ventricles:            Appears normal         Diaphragm:              Appears normal
 Choroid Plexus:        Appears normal         Stomach:                Appears normal, left
                                                                       sided
 Cerebellum:            Appears normal         Abdomen:                Appears normal
 Posterior Fossa:       Appears normal         Abdominal Wall:         Appears nml (cord
                                                                       insert, abd wall)
 Nuchal Fold:           Not applicable (>20    Cord Vessels:           Appears normal (3
                        wks GA)                                        vessel cord)
 Face:                  Appears normal         Kidneys:                Pelvic kidney LEFT
                        (orbits and profile)
 Lips:                  Appears normal         Bladder:                Appears normal
 Thoracic:              Appears normal         Spine:                  Appears normal
 Heart:                 Appears normal         Upper Extremities:      Appears normal
                        (4CH, axis, and
                        situs)
 RVOT:                  Appears normal         Lower Extremities:      Appears normal
 LVOT:                  Appears normal

 Other:  Fetus appears to be a male. Heels visualized. Nasal bone visualized.
         Technically difficult due to fetal position.
Cervix Uterus Adnexa

 Cervix
 Length:           4.06  cm.
 Normal appearance by transabdominal scan.

 Uterus
 No abnormality visualized.
 Left Ovary
 Within normal limits.

 Right Ovary
 Within normal limits.

 Adnexa
 No abnormality visualized.
Impression

 We performed a fetal anatomy scan. No markers of
 aneuploidies are seen. Left kidney is in the pelvis. No
 increased echogenicity is seen. Right kidney is in normal
 location and appears normal. No other fetal structural defects
 are seen. Cardiac anatomy appears normal.
 Fetal biometry is consistent with her previously-established
 dates. Amniotic fluid is normal and good fetal activity is seen.
 Patient understands the limitations of ultrasound in detecting
 fetal anomalies.

 xxxxxxxxx[id number]
 Consultation Note (Copied from [REDACTED]):
 Maternal-Fetal Medicine

 Name: NYA
 MRN: [id number]
 Requesting Provider: Marleen Emilie Stamberg

 Harshil Zanchez had the pleasure of seeing [facility]CAFE [facility] She is here for fetal anatomy scan and
 consultation. On your office ultrasound, fetal pelvic kidney
 was suspected.

 Patient reports she had first-trimester screening that showed
 low risk for fetal aneuploidies.

 PMH: No history of diabetes or hypertension or any other
 chronic medical conditions. She reports she does not have
 sickle-cell trait.
 PSH: Nil of note.
 Medications: Prenatal vitamins, aspirin.
 Allergies: NKDA.
 Social: Denies tobacco or drug or alcohol use in pregnancy.
 She quit smoking in this pregnancy. Her partner is African
 American (different partner) and he is in good health.
 Family: No history of venous thromboembolism.
 Gyn history: No history of abnormal Pap smears or cervical
 surgeries.
 Obstetric history:
 -1899: Term delivery of a female infant weighing 7-5 at birth.
 Labor was induced. Her daughter is in good health.
 In addition, she had 2 early SABs (reason for low-dose
 aspirin prophylaxis).

 I counseled the patient on the following:
 Pelvic kidney:
 It refers to an abnormally-located kidney. It can be associated
 with other anomalies. I also reassured the patient that rest of
 the fetal anatomy appears normal. Pelvic kidney is usually
 hypoplastic and can be associated with renal obstruction and
 hydronephrosis, renal calculi and postnatal surgery. I
 reassured her that in the presence of normal-functioning
 contralateral kidney, no early delivery is indicated. Also
 informed her that vaginal delivery is not contraindicated.

 Although the ultrasound appearance is suggestive of ectopic
 renal kidney (pelvic), it should be confirmed in later gestation.
 Fetal kidneys are better seen in the third trimester
 (perinephric fat) and I have made an appointment for her to
 return in 8 weeks for reassessment.

 In the absence of associated anomalies, the risk of
 chromosomal anomaly is very rare.

 Consultation including face-to-face counseling: 40 min.
Recommendations

 An appointment was made for her to return in 8 weeks for
 fetal growth and renal assessments.
                 [place]

## 2020-11-08 IMAGING — US US MFM OB FOLLOW UP
1 series · 13 of 28 positions shown · non-contrast
Comparison: none

[Series 1: us mfm ob follow up · 13 of 63 slices shown]
[im 3/63]
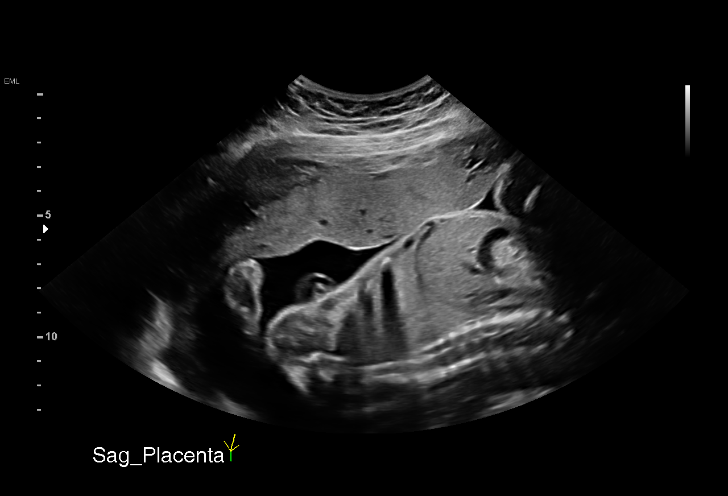
[im 7/63]
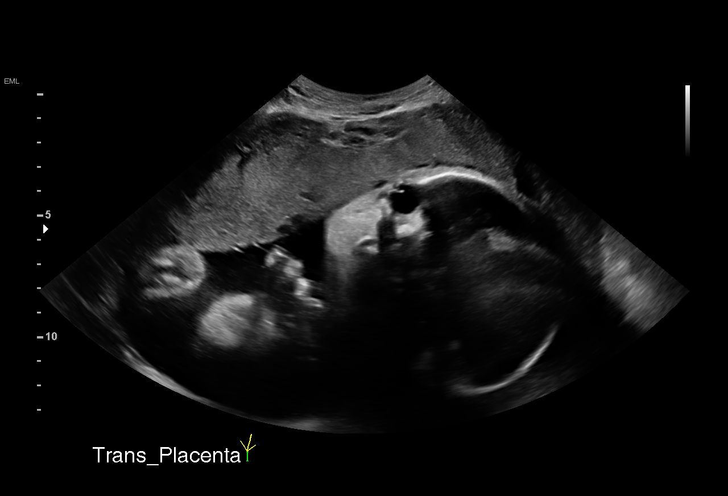
[im 12/63]
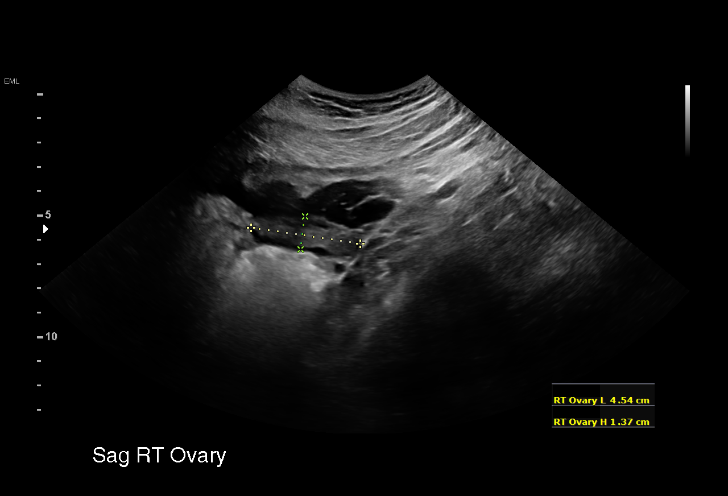
[im 17/63]
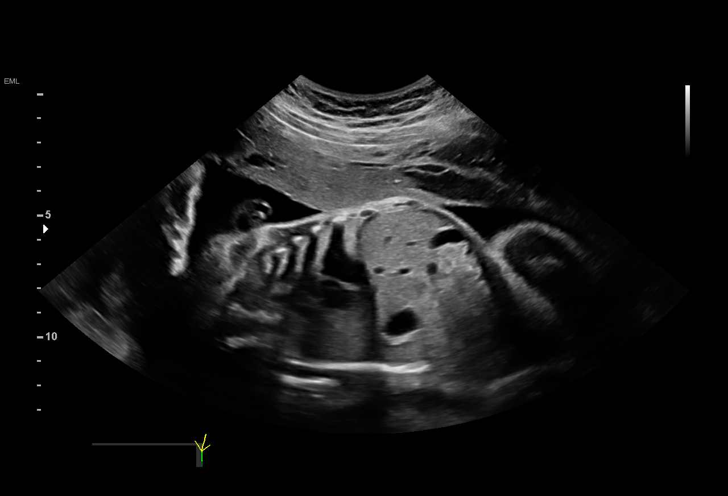
[im 21/63]
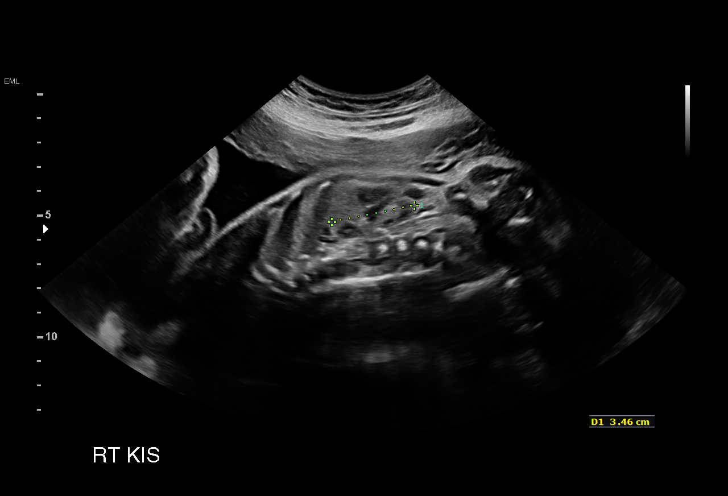
[im 26/63]
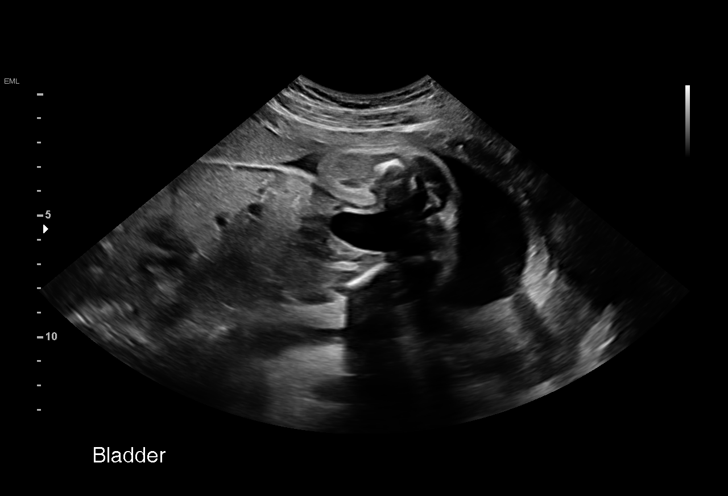
[im 33/63]
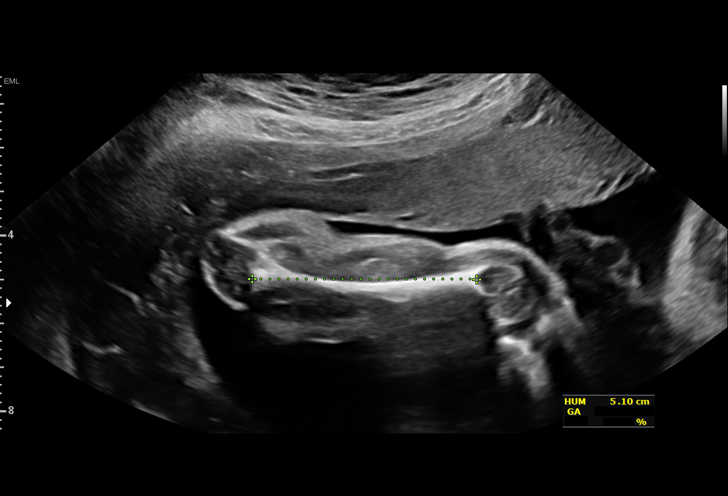
[im 37/63]
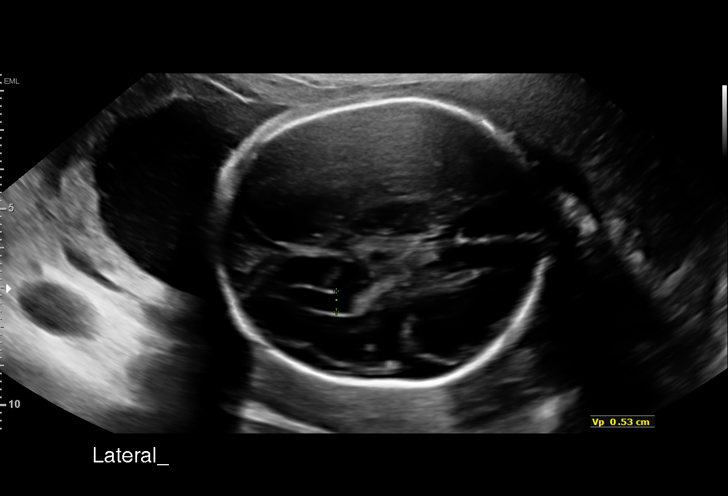
[im 42/63]
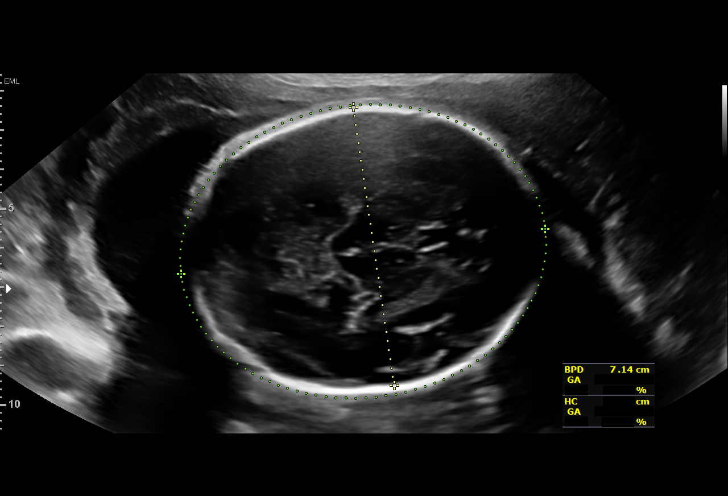
[im 46/63]
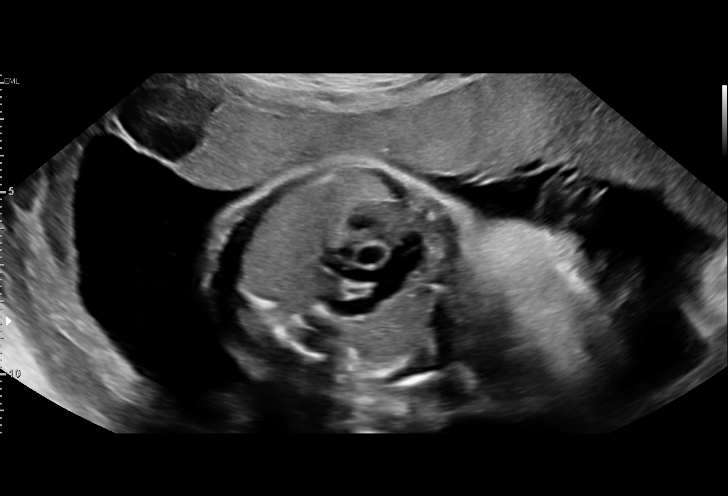
[im 51/63]
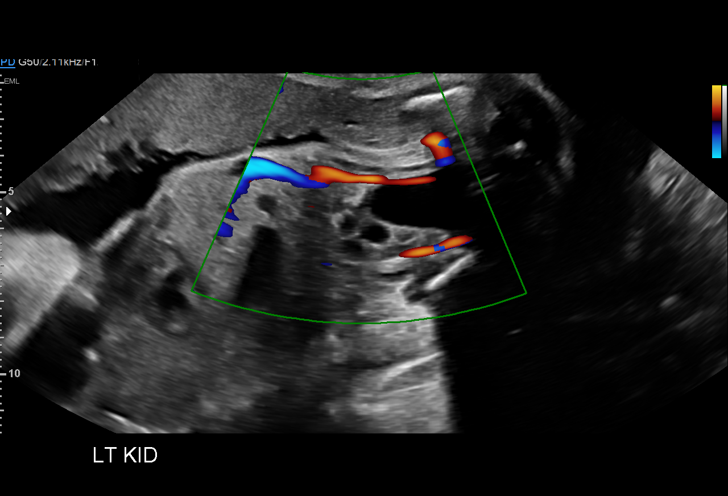
[im 56/63]
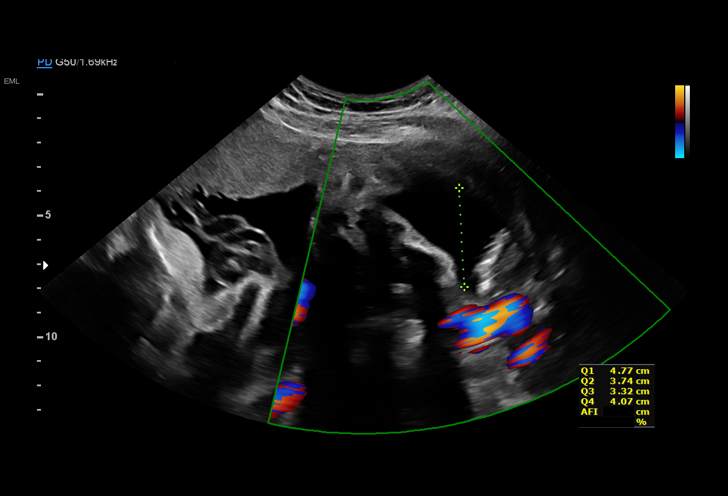
[im 60/63]
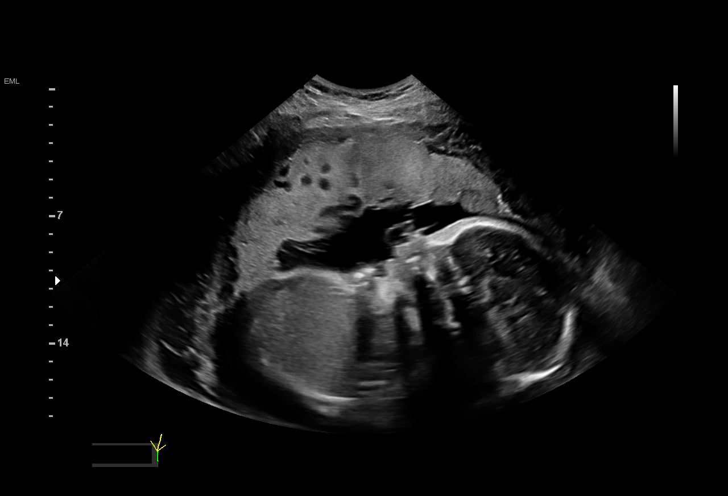

[13 of 28 positions shown; findings below may reference images not displayed]

OBGYN
                                                            [REDACTED]
 Referred By:      Erdni Ruzicka

 ----------------------------------------------------------------------

 ----------------------------------------------------------------------
Indications

  28 weeks gestation of pregnancy
  Fetal abnormality - other known or
  suspected (left pelvic kidney)
  Encounter for antenatal screening for
  malformations
  Poor obstetric history: Previous
  preeclampsia / eclampsia/gestational HTN
 ----------------------------------------------------------------------
Vital Signs

 BMI:
Fetal Evaluation

 Num Of Fetuses:         1
 Fetal Heart Rate(bpm):  136
 Cardiac Activity:       Observed
 Presentation:           Breech
 Placenta:               Anterior
 P. Cord Insertion:      Visualized, central

 Amniotic Fluid
 AFI FV:      Within normal limits

 AFI Sum(cm)     %Tile       Largest Pocket(cm)
 15.9            57

 RUQ(cm)       RLQ(cm)       LUQ(cm)        LLQ(cm)

Biometry

 BPD:        71  mm     G. Age:  28w 4d         45  %    CI:        72.13   %    70 - 86
                                                         FL/HC:      20.3   %    18.8 -
 HC:       266   mm     G. Age:  29w 0d         39  %    HC/AC:      1.10        1.05 -
 AC:      241.4  mm     G. Age:  28w 3d         47  %    FL/BPD:     76.1   %    71 - 87
 FL:         54  mm     G. Age:  28w 4d         44  %    FL/AC:      22.4   %    20 - 24
 HUM:      51.2  mm     G. Age:  30w 0d         80  %

 LV:        5.3  mm

 Est. FW:    3274  gm    2 lb 12 oz      47  %
OB History

 Gravidity:    5         Term:   1         SAB:   2
 TOP:          1        Living:  1
Gestational Age

 LMP:           34w 0d        Date:  11/07/18                 EDD:   08/14/19
 U/S Today:     28w 5d                                        EDD:   09/20/19
 Best:          28w 2d     Det. By:  Early Ultrasound         EDD:   09/23/19
                                     (02/13/19)
Anatomy

 Cranium:               Appears normal         LVOT:                   Appears normal
 Cavum:                 Appears normal         Aortic Arch:            Appears normal
 Ventricles:            Appears normal         Ductal Arch:            Appears normal
 Choroid Plexus:        Appears normal         Diaphragm:              Appears normal
 Cerebellum:            Appears normal         Stomach:                Appears normal, left
                                                                       sided
 Posterior Fossa:       Appears normal         Abdomen:                Appears normal
 Nuchal Fold:           Not applicable (>20    Abdominal Wall:         Appears nml (cord
                        wks GA)                                        insert, abd wall)
 Face:                  Orbits previously      Cord Vessels:           Appears normal (3
                        seen, profile nl.                              vessel cord)
 Lips:                  Previously seen        Kidneys:                Pelvic kidney, left
 Palate:                Previously seen        Bladder:                Appears normal
 Thoracic:              Appears normal         Spine:                  Previously seen
 Heart:                 Appears normal         Upper Extremities:      Previously seen
                        (4CH, axis, and
                        situs)
 RVOT:                  Appears normal         Lower Extremities:      Previously seen
Cervix Uterus Adnexa

 Cervix
 Not visualized (advanced GA >58wks)

 Left Ovary
 Within normal limits.

 Right Ovary
 Within normal limits.

 Adnexa
 No abnormality visualized.
Impression

 Patient returned for evaluation of fetal growth and renal
 assessments. Left pelvic kidney is seen on her previous
 scan. Patient does not have gestational diabetes.

 Fetal growth is appropriate for gestational age. Amniotic fluid
 is normal and good fetal activity is seen. Left pelvic kidney is
 seen and is small. Right kidney appears normal. Fetal urinary
 bladder appears normal.

 We reassured the patient of the findings.
Recommendations

 -An appointment was made for her to return in 6 weeks for
 fetal growth and renal assessments.
                 MASETAFA KORTOBI

## 2021-08-08 ENCOUNTER — Telehealth: Payer: Medicaid Other | Admitting: Physician Assistant

## 2021-08-08 DIAGNOSIS — J208 Acute bronchitis due to other specified organisms: Secondary | ICD-10-CM

## 2021-08-08 DIAGNOSIS — B9689 Other specified bacterial agents as the cause of diseases classified elsewhere: Secondary | ICD-10-CM

## 2021-08-08 MED ORDER — PREDNISONE 10 MG (21) PO TBPK: ORAL_TABLET | ORAL | 0 refills | Status: DC

## 2021-08-08 MED ORDER — BENZONATATE 100 MG PO CAPS: 100.0000 mg | ORAL_CAPSULE | Freq: Three times a day (TID) | ORAL | 0 refills | Status: DC | PRN

## 2021-08-08 MED ORDER — AZITHROMYCIN 250 MG PO TABS: ORAL_TABLET | ORAL | 0 refills | Status: DC

## 2021-08-08 NOTE — Patient Instructions (Signed)
[name], thank you for joining Cynthia Loveless, PA-C for today's virtual visit.  While this provider is not your primary care provider (PCP), if your PCP is located in our provider database this encounter information will be shared with them immediately following your visit.  Consent: (Patient) [name] provided verbal consent for this virtual visit at the beginning of the encounter.  Current Medications:  Current Outpatient Medications:    azithromycin (ZITHROMAX) 250 MG tablet, Take 2 tablets PO on day one, and one tablet PO daily thereafter until completed., Disp: 6 tablet, Rfl: 0   benzonatate (TESSALON) 100 MG capsule, Take 1 capsule (100 mg total) by mouth 3 (three) times daily as needed., Disp: 30 capsule, Rfl: 0   predniSONE (STERAPRED UNI-PAK 21 TAB) 10 MG (21) TBPK tablet, 6 day taper; take as directed on package instructions, Disp: 21 tablet, Rfl: 0   albuterol (PROVENTIL HFA;VENTOLIN HFA) 108 (90 Base) MCG/ACT inhaler, Inhale 1-2 puffs into the lungs every 6 (six) hours as needed for wheezing or shortness of breath., Disp: , Rfl:    doxycycline (VIBRAMYCIN) 100 MG capsule, Take 1 capsule (100 mg total) by mouth 2 (two) times daily., Disp: 14 capsule, Rfl: 0   fluconazole (DIFLUCAN) 150 MG tablet, Take one tablet at the onset of symptoms, if still having symptoms in 3 days, take the second tablet., Disp: 2 tablet, Rfl: 0   fluticasone furoate-vilanterol (BREO ELLIPTA) 100-25 MCG/INH AEPB, Inhale 2 puffs into the lungs daily., Disp: , Rfl:    hydrocortisone 1 % lotion, Apply 1 application topically 2 (two) times daily., Disp: 118 mL, Rfl: 0   ibuprofen (ADVIL) 600 MG tablet, Take 1 tablet (600 mg total) by mouth every 6 (six) hours., Disp: 30 tablet, Rfl: 0   permethrin (ELIMITE) 5 % cream, Apply to affected area once, Disp: 60 g, Rfl: 1   Prenatal Vit-Fe Fumarate-FA (PREPLUS) 27-1 MG TABS, Take 1 tablet by mouth daily., Disp: 100 tablet, Rfl: 3   Medications  ordered in this encounter:  Meds ordered this encounter  Medications   azithromycin (ZITHROMAX) 250 MG tablet    Sig: Take 2 tablets PO on day one, and one tablet PO daily thereafter until completed.    Dispense:  6 tablet    Refill:  0    Order Specific Question:   Supervising Provider    Answer:   MILLER, BRIAN [[id number]]   predniSONE (STERAPRED UNI-PAK 21 TAB) 10 MG (21) TBPK tablet    Sig: 6 day taper; take as directed on package instructions    Dispense:  21 tablet    Refill:  0    Order Specific Question:   Supervising Provider    Answer:   MILLER, BRIAN [[id number]]   benzonatate (TESSALON) 100 MG capsule    Sig: Take 1 capsule (100 mg total) by mouth 3 (three) times daily as needed.    Dispense:  30 capsule    Refill:  0    Order Specific Question:   Supervising Provider    Answer:   Hyacinth Meeker, BRIAN [[id number]]     *If you need refills on other medications prior to your next appointment, please contact your pharmacy*  Follow-Up: Call back or seek an in-person evaluation if the symptoms worsen or if the condition fails to improve as anticipated.  Other Instructions Acute Bronchitis, Adult  Acute bronchitis is when air tubes in the lungs (bronchi) suddenly get swollen. The condition can make it hard for you to breathe.  In adults, acute bronchitis usually goes away within 2 weeks. A cough caused by bronchitis may last up to 3 weeks. Smoking, allergies, and asthma can make thecondition worse. What are the causes? This condition is caused by: Cold and flu viruses. The most common cause of this condition is the virus that causes the common cold. Bacteria. Substances that irritate the lungs, including: Smoke from cigarettes and other types of tobacco. Dust and pollen. Fumes from chemicals, gases, or burned fuel. Other materials that pollute indoor or outdoor air. Close contact with someone who has acute bronchitis. What increases the risk? The following factors may make you more likely to  develop this condition: A weak body's defense system. This is also called the immune system. Any condition that affects your lungs and breathing, such as asthma. What are the signs or symptoms? Symptoms of this condition include: A cough. Coughing up clear, yellow, or green mucus. Wheezing. Having too much mucus in your lungs (chest congestion). Shortness of breath. A fever. Chills. Body aches. A sore throat. How is this treated? Acute bronchitis may go away over time without treatment. Your doctor may recommend: Drinking more fluids. Using a device that gets medicine into your lungs (inhaler). Using a vaporizer or a humidifier. These are machines that add water or moisture to the air. This helps with coughing and poor breathing. Taking a medicine for fever. Taking a medicine that thins mucus and clears congestion. Taking a medicine that prevents or stops coughing. Follow these instructions at home: Activity Get a lot of rest. Return to your normal activities as told by your doctor. Ask your doctor what activities are safe for you. Lifestyle  Drink enough fluid to keep your pee (urine) pale yellow. Do not drink alcohol. Do not use any products that contain nicotine or tobacco, such as cigarettes, e-cigarettes, and chewing tobacco. If you need help quitting, ask your doctor. Be aware that: Your bronchitis will get worse if you smoke or breathe in other people's smoke (secondhand smoke). Your lungs will heal faster if you quit smoking.  General instructions Take over-the-counter and prescription medicines only as told by your doctor. Use an inhaler, cool mist vaporizer, or humidifier as told by your doctor. Rinse your mouth often with salt water. To make salt water, dissolve -1 tsp (3-6 g) of salt in 1 cup (237 mL) of warm water. Take two teaspoons of honey at bedtime. This helps lessen your coughing at night. Keep all follow-up visits as told by your doctor. This is  important. How is this prevented? To lower your risk of getting this condition again: Wash your hands often with soap and water. If you cannot use soap and water, use hand sanitizer. Avoid contact with people who have cold symptoms. Try not to touch your mouth, nose, or eyes with your hands. Make sure to get the flu shot every year. Contact a doctor if: Your symptoms do not get better in 2 weeks. You vomit more than once or twice. You have symptoms of loss of fluid from your body (dehydration). These include: Dark pee. Dry skin or eyes. Increased thirst. Headaches. Confusion. Muscle cramps. Get help right away if: You cough up blood. You have chest pain. You have very bad shortness of breath. You become dehydrated. You faint or keep feeling like you are going to faint. You have a very bad headache. Your fever or chills get worse. These symptoms may be an emergency. Get help right away. Call your local emergency services (  911 in the [place] Do not wait to see if the symptoms will go away. Do not drive yourself to the hospital. Summary Acute bronchitis is when air tubes in the lungs (bronchi) suddenly get swollen. In adults, acute bronchitis usually goes away within 2 weeks. Take over-the-counter and prescription medicines only as told by your doctor. Drink enough fluid to keep your pee (urine) pale yellow. Contact a doctor if your symptoms do not improve after 2 weeks of treatment. Get help right away if you cough up blood, faint, or have chest pain or shortness of breath. This information is not intended to replace advice given to you by your health care provider. Make sure you discuss any questions you have with your healthcare provider. Document Revised: 11/03/2020 Document Reviewed: 06/27/2019 Elsevier Patient Education  2022 [employer].    If you have been instructed to have an in-person evaluation today at a local Urgent Care facility, please use the link below. It will  take you to a list of all of our available [facility], including address, phone number and hours of operation. Please do not delay care.  [facility]  If you or a family member do not have a primary care provider, use the link below to schedule a visit and establish care. When you choose a [facility] primary care physician or advanced practice provider, you gain a long-term partner in health. Find a Primary Care Provider  Learn more about [facility]'s in-office and virtual care options: [facility] - Get Care Now

## 2021-08-08 NOTE — Progress Notes (Signed)
Virtual Visit Consent   TOVA VATER, you are scheduled for a virtual visit with a [facility] provider today.     Just as with appointments in the office, your consent must be obtained to participate.  Your consent will be active for this visit and any virtual visit you may have with one of our providers in the next 365 days.     If you have a MyChart account, a copy of this consent can be sent to you electronically.  All virtual visits are billed to your insurance company just like a traditional visit in the office.    As this is a virtual visit, video technology does not allow for your provider to perform a traditional examination.  This may limit your provider's ability to fully assess your condition.  If your provider identifies any concerns that need to be evaluated in person or the need to arrange testing (such as labs, EKG, etc.), we will make arrangements to do so.     Although advances in technology are sophisticated, we cannot ensure that it will always work on either your end or our end.  If the connection with a video visit is poor, the visit may have to be switched to a telephone visit.  With either a video or telephone visit, we are not always able to ensure that we have a secure connection.     I need to obtain your verbal consent now.   Are you willing to proceed with your visit today?    [name] has provided verbal consent on 08/08/2021 for a virtual visit (video or telephone).   [name], PA-C   Date: 08/08/2021 2:08 PM   Virtual Visit via Video Note   I, [name], connected with  [name]  ([id number], August 07, 1991) on 08/08/21 at  2:00 PM EDT by a video-enabled telemedicine application and verified that I am speaking with the correct person using two identifiers.  Location: Patient: Virtual Visit Location Patient: Home Provider: Virtual Visit Location Provider: Home Office   I discussed the limitations of evaluation and management  by telemedicine and the availability of in person appointments. The patient expressed understanding and agreed to proceed.    History of Present Illness: [name] is a 30 y.o. who identifies as a female who was assigned female at birth, and is being seen today for sinus congestion/URI symptoms.  HPI: Sinusitis This is a new problem. The current episode started 1 to 4 weeks ago. The problem has been gradually worsening since onset. There has been no fever. Associated symptoms include congestion, coughing, ear pain (fullness), headaches, a hoarse voice, shortness of breath (h/o asthma), sinus pressure, a sore throat and swollen glands. Pertinent negatives include no chills or diaphoresis. Past treatments include acetaminophen (mucinex). The treatment provided mild relief.    She and her son started with symptoms 2 weeks ago. He improved. Then her daughter was sick last week with same symptoms and was diagnosed with atypical pneumonia. [name]'s symptoms has persisted through all of this and worsened yesterday and today. Having more SOB, chest tightness and cough. Had to use her albuterol inhaler yesterday. PMH positive for asthma.   Problems:  Patient Active Problem List   Diagnosis Date Noted   SVD (spontaneous vaginal delivery) 09/06/2019   Indication for care in labor or delivery 09/05/2019    Allergies: No Known Allergies Medications:  Current Outpatient Medications:    azithromycin (ZITHROMAX) 250 MG tablet, Take  2 tablets PO on day one, and one tablet PO daily thereafter until completed., Disp: 6 tablet, Rfl: 0   benzonatate (TESSALON) 100 MG capsule, Take 1 capsule (100 mg total) by mouth 3 (three) times daily as needed., Disp: 30 capsule, Rfl: 0   predniSONE (STERAPRED UNI-PAK 21 TAB) 10 MG (21) TBPK tablet, 6 day taper; take as directed on package instructions, Disp: 21 tablet, Rfl: 0   albuterol (PROVENTIL HFA;VENTOLIN HFA) 108 (90 Base) MCG/ACT inhaler, Inhale 1-2 puffs into  the lungs every 6 (six) hours as needed for wheezing or shortness of breath., Disp: , Rfl:    doxycycline (VIBRAMYCIN) 100 MG capsule, Take 1 capsule (100 mg total) by mouth 2 (two) times daily., Disp: 14 capsule, Rfl: 0   fluconazole (DIFLUCAN) 150 MG tablet, Take one tablet at the onset of symptoms, if still having symptoms in 3 days, take the second tablet., Disp: 2 tablet, Rfl: 0   fluticasone furoate-vilanterol (BREO ELLIPTA) 100-25 MCG/INH AEPB, Inhale 2 puffs into the lungs daily., Disp: , Rfl:    hydrocortisone 1 % lotion, Apply 1 application topically 2 (two) times daily., Disp: 118 mL, Rfl: 0   ibuprofen (ADVIL) 600 MG tablet, Take 1 tablet (600 mg total) by mouth every 6 (six) hours., Disp: 30 tablet, Rfl: 0   permethrin (ELIMITE) 5 % cream, Apply to affected area once, Disp: 60 g, Rfl: 1   Prenatal Vit-Fe Fumarate-FA (PREPLUS) 27-1 MG TABS, Take 1 tablet by mouth daily., Disp: 100 tablet, Rfl: 3  Observations/Objective: Patient is well-developed, well-nourished in no acute distress.  Resting comfortably at home.  Head is normocephalic, atraumatic.  No labored breathing.  Speech is clear and coherent with logical content.  Patient is alert and oriented at baseline.    Assessment and Plan: 1. Acute bacterial bronchitis - azithromycin (ZITHROMAX) 250 MG tablet; Take 2 tablets PO on day one, and one tablet PO daily thereafter until completed.  Dispense: 6 tablet; Refill: 0 - predniSONE (STERAPRED UNI-PAK 21 TAB) 10 MG (21) TBPK tablet; 6 day taper; take as directed on package instructions  Dispense: 21 tablet; Refill: 0 - benzonatate (TESSALON) 100 MG capsule; Take 1 capsule (100 mg total) by mouth 3 (three) times daily as needed.  Dispense: 30 capsule; Refill: 0  -Worsening over last 2 weeks despite OTC management.  - Will treat with zpak, prednisone and tessalon perles.  - Continue to use her albuterol inhaler - Push fluids.  - Rest.  - Seek in-person evaluation if worsening or  not improving   Follow Up Instructions: I discussed the assessment and treatment plan with the patient. The patient was provided an opportunity to ask questions and all were answered. The patient agreed with the plan and demonstrated an understanding of the instructions.  A copy of instructions were sent to the patient via MyChart.  The patient was advised to call back or seek an in-person evaluation if the symptoms worsen or if the condition fails to improve as anticipated.  Time:  I spent 12 minutes with the patient via telehealth technology discussing the above problems/concerns.    [name], PA-C

## 2021-08-23 ENCOUNTER — Emergency Department (HOSPITAL_BASED_OUTPATIENT_CLINIC_OR_DEPARTMENT_OTHER)
Admission: EM | Admit: 2021-08-23 | Discharge: 2021-08-24 | Disposition: A | Discharge status: Home / Self Care | Payer: Medicaid Other | Attending: Emergency Medicine | Admitting: Emergency Medicine

## 2021-08-23 DIAGNOSIS — Z7951 Long term (current) use of inhaled steroids: Secondary | ICD-10-CM | POA: Insufficient documentation

## 2021-08-23 DIAGNOSIS — F17210 Nicotine dependence, cigarettes, uncomplicated: Secondary | ICD-10-CM | POA: Diagnosis not present

## 2021-08-23 DIAGNOSIS — S4991XA Unspecified injury of right shoulder and upper arm, initial encounter: Secondary | ICD-10-CM | POA: Diagnosis present

## 2021-08-23 DIAGNOSIS — J45909 Unspecified asthma, uncomplicated: Secondary | ICD-10-CM | POA: Diagnosis not present

## 2021-08-23 DIAGNOSIS — X58XXXA Exposure to other specified factors, initial encounter: Secondary | ICD-10-CM | POA: Insufficient documentation

## 2021-08-23 DIAGNOSIS — S46911A Strain of unspecified muscle, fascia and tendon at shoulder and upper arm level, right arm, initial encounter: Secondary | ICD-10-CM | POA: Diagnosis not present

## 2021-08-23 NOTE — ED Triage Notes (Signed)
Possible dislocation of right shoulder while rocking a baby.  Patient states shoulder has dislocated before. Sling and swath in place by EMS.

## 2021-08-24 ENCOUNTER — Encounter (HOSPITAL_BASED_OUTPATIENT_CLINIC_OR_DEPARTMENT_OTHER): Payer: Self-pay

## 2021-08-24 ENCOUNTER — Emergency Department (HOSPITAL_BASED_OUTPATIENT_CLINIC_OR_DEPARTMENT_OTHER): Payer: Medicaid Other

## 2021-08-24 ENCOUNTER — Other Ambulatory Visit: Payer: Self-pay

## 2021-08-24 MED ORDER — TRAMADOL HCL 50 MG PO TABS
50.0000 mg | ORAL_TABLET | Freq: Once | ORAL | Status: AC
  Administered 2021-08-24: 50 mg via ORAL
  Filled 2021-08-24: qty 1

## 2021-08-24 MED ORDER — TRAMADOL HCL 50 MG PO TABS: 50.0000 mg | ORAL_TABLET | Freq: Four times a day (QID) | ORAL | 0 refills | Status: DC | PRN

## 2021-08-24 NOTE — Discharge Instructions (Addendum)
Wear shoulder sling for the next several days.  Take ibuprofen 600 mg 3 times daily for the next 5 days.  Take tramadol as prescribed as needed for pain not relieved with ibuprofen.  Follow-up with primary if not improving next few days.

## 2021-08-24 NOTE — ED Provider Notes (Signed)
[facility] EMERGENCY DEPT Provider Note   CSN: [id number] Arrival date & time: 08/23/21  2357     History Chief Complaint  Patient presents with   Shoulder Pain    [name] is a 30 y.o. female.  Patient is a 30 year old female with history of asthma, anemia, and prior right shoulder dislocation.  Patient presenting today with complaints of right shoulder pain.  She states she was rocking her baby when her arm shifted and she developed pain in her right shoulder.  She reports hearing a "pop".  She denies any numbness or tingling.  She has history of prior shoulder dislocation 8 or 9 years ago.  The history is provided by the patient.  Shoulder Pain Location:  Shoulder Shoulder location:  R shoulder Injury: no   Pain details:    Quality:  Sharp   Radiates to:  Does not radiate   Severity:  Severe   Onset quality:  Sudden   Timing:  Constant   Progression:  Unchanged Relieved by:  Nothing     Past Medical History:  Diagnosis Date   Anemia    Asthma    Depression    Generalized anxiety disorder    GERD (gastroesophageal reflux disease)    History of chlamydia     Patient Active Problem List   Diagnosis Date Noted   SVD (spontaneous vaginal delivery) 09/06/2019   Indication for care in labor or delivery 09/05/2019    Past Surgical History:  Procedure Laterality Date   DILATION AND CURETTAGE OF UTERUS       OB History     Gravida  5   Para  2   Term  2   Preterm      AB  3   Living  2      SAB  2   IAB  1   Ectopic      Multiple  0   Live Births  2           Family History  Problem Relation Age of Onset   Anesthesia problems Mother    Arthritis Mother    Asthma Brother    Seizures Maternal Aunt    Anesthesia problems Maternal Aunt    Hypertension Paternal Aunt    Cancer Paternal Aunt        lung   Hypertension Paternal Uncle    Cancer Maternal Grandmother        cervix   Seizures Maternal Grandmother     Hypertension Maternal Grandfather    Cancer Maternal Grandfather        prostate   Hypertension Paternal Grandfather    Mental retardation Cousin        cp x2    Social History   Tobacco Use   Smoking status: Every Day    Packs/day: 0.25    Years: 2.00    Pack years: 0.50    Types: Cigarettes   Smokeless tobacco: Never  Substance Use Topics   Alcohol use: Yes    Comment: occasionally   Drug use: No    Home Medications Prior to Admission medications   Medication Sig Start Date End Date Taking? Authorizing Provider  albuterol (PROVENTIL HFA;VENTOLIN HFA) 108 (90 Base) MCG/ACT inhaler Inhale 1-2 puffs into the lungs every 6 (six) hours as needed for wheezing or shortness of breath.    [provider]  azithromycin (ZITHROMAX) 250 MG tablet Take 2 tablets PO on day one, and one tablet PO daily  thereafter until completed. 08/08/21   [name], PA-C  benzonatate (TESSALON) 100 MG capsule Take 1 capsule (100 mg total) by mouth 3 (three) times daily as needed. 08/08/21   [name], PA-C  doxycycline (VIBRAMYCIN) 100 MG capsule Take 1 capsule (100 mg total) by mouth 2 (two) times daily. 06/16/20   Moshe Cipro, NP  fluconazole (DIFLUCAN) 150 MG tablet Take one tablet at the onset of symptoms, if still having symptoms in 3 days, take the second tablet. 06/16/20   Moshe Cipro, NP  fluticasone furoate-vilanterol (BREO ELLIPTA) 100-25 MCG/INH AEPB Inhale 2 puffs into the lungs daily.    [provider]  hydrocortisone 1 % lotion Apply 1 application topically 2 (two) times daily. 01/26/20   Arlyn Dunning, PA-C  ibuprofen (ADVIL) 600 MG tablet Take 1 tablet (600 mg total) by mouth every 6 (six) hours. 09/08/19   Bovard-Stuckert, Augusto Gamble, MD  permethrin (ELIMITE) 5 % cream Apply to affected area once 01/26/20   Dahlia Byes A, NP  predniSONE (STERAPRED UNI-PAK 21 TAB) 10 MG (21) TBPK tablet 6 day taper; take as directed on package instructions 08/08/21    [name], PA-C  Prenatal Vit-Fe Fumarate-FA (PREPLUS) 27-1 MG TABS Take 1 tablet by mouth daily. 09/08/19   Bovard-Stuckert, Augusto Gamble, MD    Allergies    Patient has no known allergies.  Review of Systems   Review of Systems  All other systems reviewed and are negative.  Physical Exam Updated Vital Signs BP 132/90 (BP Location: Left Arm)   Pulse 78   Temp 98.4 F (36.9 C) (Oral)   Resp 16   Ht 5\' 5"  (1.651 m)   Wt 81.6 kg   LMP 08/22/2021 (Approximate) Comment: spotted two days ago. Takes birth control 30 days of the month with no placebo week  SpO2 99%   BMI 29.95 kg/m   Physical Exam Vitals and nursing note reviewed.  Constitutional:      General: She is not in acute distress.    Appearance: Normal appearance. She is not ill-appearing.  HENT:     Head: Normocephalic and atraumatic.  Pulmonary:     Effort: Pulmonary effort is normal.  Musculoskeletal:     Comments: There is tenderness to palpation over the right shoulder.  Exam limited secondary to discomfort.  Ulnar and radial pulses are palpable and motor and sensation are intact throughout the hand.  Skin:    General: Skin is warm and dry.  Neurological:     General: No focal deficit present.     Mental Status: She is alert and oriented to person, place, and time.    ED Results / Procedures / Treatments   Labs (all labs ordered are listed, but only abnormal results are displayed) Labs Reviewed - No data to display  EKG None  Radiology No results found.  Procedures Procedures   Medications Ordered in ED Medications - No data to display  ED Course  I have reviewed the triage vital signs and the nursing notes.  Pertinent labs & imaging results that were available during my care of the patient were reviewed by me and considered in my medical decision making (see chart for details).    MDM Rules/Calculators/A&P  Patient is a 30 year old female with history of right shoulder dislocation  presenting with right shoulder pain.  She was rocking her child when she felt her shoulder pop followed by a pain.  Patient's shoulder exam somewhat limited secondary to discomfort, but appears  anatomically normal.  The arm is neurovascularly intact.  She does have some pain with range of motion of the shoulder, but x-rays are negative for fracture or dislocation.  At this point, patient will be discharged with an arm sling, NSAIDs, tramadol, rest, and follow-up as needed if not improving.  Final Clinical Impression(s) / ED Diagnoses Final diagnoses:  None    Rx / DC Orders ED Discharge Orders     None        Geoffery Lyons, MD 08/24/21 [phone]

## 2021-08-31 ENCOUNTER — Other Ambulatory Visit (HOSPITAL_BASED_OUTPATIENT_CLINIC_OR_DEPARTMENT_OTHER): Payer: Self-pay | Admitting: Orthopaedic Surgery

## 2021-08-31 DIAGNOSIS — M25511 Pain in right shoulder: Secondary | ICD-10-CM

## 2021-09-01 ENCOUNTER — Ambulatory Visit (HOSPITAL_BASED_OUTPATIENT_CLINIC_OR_DEPARTMENT_OTHER): Payer: Medicaid Other | Admitting: Orthopaedic Surgery

## 2021-09-05 ENCOUNTER — Ambulatory Visit (HOSPITAL_BASED_OUTPATIENT_CLINIC_OR_DEPARTMENT_OTHER): Payer: Medicaid Other | Admitting: Orthopaedic Surgery

## 2021-09-05 ENCOUNTER — Other Ambulatory Visit (HOSPITAL_BASED_OUTPATIENT_CLINIC_OR_DEPARTMENT_OTHER): Payer: Self-pay | Admitting: Orthopaedic Surgery

## 2021-10-30 ENCOUNTER — Telehealth: Payer: Medicaid Other | Admitting: Physician Assistant

## 2021-10-30 DIAGNOSIS — J4541 Moderate persistent asthma with (acute) exacerbation: Secondary | ICD-10-CM

## 2021-10-30 MED ORDER — PSEUDOEPH-BROMPHEN-DM 30-2-10 MG/5ML PO SYRP: 5.0000 mL | ORAL_SOLUTION | Freq: Four times a day (QID) | ORAL | 0 refills | Status: DC | PRN

## 2021-10-30 MED ORDER — AZITHROMYCIN 250 MG PO TABS: ORAL_TABLET | ORAL | 0 refills | Status: DC

## 2021-10-30 MED ORDER — PREDNISONE 10 MG (21) PO TBPK: ORAL_TABLET | ORAL | 0 refills | Status: DC

## 2021-10-30 MED ORDER — BENZONATATE 100 MG PO CAPS: 100.0000 mg | ORAL_CAPSULE | Freq: Three times a day (TID) | ORAL | 0 refills | Status: DC | PRN

## 2021-10-30 NOTE — Progress Notes (Signed)
Virtual Visit Consent   [name], you are scheduled for a virtual visit with a  provider today.     Just as with appointments in the office, your consent must be obtained to participate.  Your consent will be active for this visit and any virtual visit you may have with one of our providers in the next 365 days.     If you have a MyChart account, a copy of this consent can be sent to you electronically.  All virtual visits are billed to your insurance company just like a traditional visit in the office.    As this is a virtual visit, video technology does not allow for your provider to perform a traditional examination.  This may limit your provider's ability to fully assess your condition.  If your provider identifies any concerns that need to be evaluated in person or the need to arrange testing (such as labs, EKG, etc.), we will make arrangements to do so.     Although advances in technology are sophisticated, we cannot ensure that it will always work on either your end or our end.  If the connection with a video visit is poor, the visit may have to be switched to a telephone visit.  With either a video or telephone visit, we are not always able to ensure that we have a secure connection.     I need to obtain your verbal consent now.   Are you willing to proceed with your visit today?    [name] has provided verbal consent on 10/30/2021 for a virtual visit (video or telephone).   [name], PA-C   Date: 10/30/2021 5:18 PM   Virtual Visit via Video Note   I, [name], connected with  [name]  ([id number], [birth year]) on 10/30/21 at  5:15 PM EST by a video-enabled telemedicine application and verified that I am speaking with the correct person using two identifiers.  Location: Patient: Virtual Visit Location Patient: Home Provider: Virtual Visit Location Provider: Home Office   I discussed the limitations of evaluation and  management by telemedicine and the availability of in person appointments. The patient expressed understanding and agreed to proceed.    History of Present Illness: [name] is a 30 y.o. who identifies as a female who was assigned female at birth, and is being seen today for cough.  HPI: Cough This is a new problem. The current episode started in the past 7 days (wednesday). The problem has been gradually worsening. The cough is Productive of sputum. Associated symptoms include chest pain, chills, a fever, nasal congestion, postnasal drip, shortness of breath and wheezing. Associated symptoms comments: Hot flashes and chills. Risk factors for lung disease include smoking/tobacco exposure (kids have been sick). Treatments tried: inhalers, mucinex, hot teas. The treatment provided no relief. Her past medical history is significant for asthma and bronchitis.    Home covid testing is negative  Problems:  Patient Active Problem List   Diagnosis Date Noted   SVD (spontaneous vaginal delivery) 09/06/2019   Indication for care in labor or delivery 09/05/2019    Allergies: No Known Allergies Medications:  Current Outpatient Medications:    azithromycin (ZITHROMAX) 250 MG tablet, Take 2 tablets PO on day one, and one tablet PO daily thereafter until completed., Disp: 6 tablet, Rfl: 0   benzonatate (TESSALON) 100 MG capsule, Take 1 capsule (100 mg total) by mouth 3 (three) times daily as needed., Disp: 30  capsule, Rfl: 0   brompheniramine-pseudoephedrine-DM 30-2-10 MG/5ML syrup, Take 5 mLs by mouth 4 (four) times daily as needed., Disp: 120 mL, Rfl: 0   predniSONE (STERAPRED UNI-PAK 21 TAB) 10 MG (21) TBPK tablet, 6 day taper; take as directed on package instructions, Disp: 21 tablet, Rfl: 0   albuterol (PROVENTIL HFA;VENTOLIN HFA) 108 (90 Base) MCG/ACT inhaler, Inhale 1-2 puffs into the lungs every 6 (six) hours as needed for wheezing or shortness of breath., Disp: , Rfl:    fluticasone  furoate-vilanterol (BREO ELLIPTA) 100-25 MCG/INH AEPB, Inhale 2 puffs into the lungs daily., Disp: , Rfl:   Observations/Objective: Patient is well-developed, well-nourished in no acute distress.  Resting comfortably at home.  Head is normocephalic, atraumatic.  No labored breathing.  Speech is clear and coherent with logical content.  Patient is alert and oriented at baseline.    Assessment and Plan: 1. Moderate persistent asthmatic bronchitis with acute exacerbation - azithromycin (ZITHROMAX) 250 MG tablet; Take 2 tablets PO on day one, and one tablet PO daily thereafter until completed.  Dispense: 6 tablet; Refill: 0 - predniSONE (STERAPRED UNI-PAK 21 TAB) 10 MG (21) TBPK tablet; 6 day taper; take as directed on package instructions  Dispense: 21 tablet; Refill: 0 - brompheniramine-pseudoephedrine-DM 30-2-10 MG/5ML syrup; Take 5 mLs by mouth 4 (four) times daily as needed.  Dispense: 120 mL; Refill: 0 - benzonatate (TESSALON) 100 MG capsule; Take 1 capsule (100 mg total) by mouth 3 (three) times daily as needed.  Dispense: 30 capsule; Refill: 0  - High risk for bacterial infection - Will treat with Zpak - Bromfed DM and Tessalon perles for cough - Prednisone for asthma exacerbation - Push fluids - Rest - Seek in person evaluation if worsening or fails to improve  Follow Up Instructions: I discussed the assessment and treatment plan with the patient. The patient was provided an opportunity to ask questions and all were answered. The patient agreed with the plan and demonstrated an understanding of the instructions.  A copy of instructions were sent to the patient via MyChart unless otherwise noted below.    The patient was advised to call back or seek an in-person evaluation if the symptoms worsen or if the condition fails to improve as anticipated.  Time:  I spent 12 minutes with the patient via telehealth technology discussing the above problems/concerns.    [name], PA-C

## 2021-10-30 NOTE — Patient Instructions (Signed)
[name], thank you for joining Cynthia Loveless, PA-C for today's virtual visit.  While this provider is not your primary care provider (PCP), if your PCP is located in our provider database this encounter information will be shared with them immediately following your visit.  Consent: (Patient) [name] provided verbal consent for this virtual visit at the beginning of the encounter.  Current Medications:  Current Outpatient Medications:    azithromycin (ZITHROMAX) 250 MG tablet, Take 2 tablets PO on day one, and one tablet PO daily thereafter until completed., Disp: 6 tablet, Rfl: 0   benzonatate (TESSALON) 100 MG capsule, Take 1 capsule (100 mg total) by mouth 3 (three) times daily as needed., Disp: 30 capsule, Rfl: 0   brompheniramine-pseudoephedrine-DM 30-2-10 MG/5ML syrup, Take 5 mLs by mouth 4 (four) times daily as needed., Disp: 120 mL, Rfl: 0   predniSONE (STERAPRED UNI-PAK 21 TAB) 10 MG (21) TBPK tablet, 6 day taper; take as directed on package instructions, Disp: 21 tablet, Rfl: 0   albuterol (PROVENTIL HFA;VENTOLIN HFA) 108 (90 Base) MCG/ACT inhaler, Inhale 1-2 puffs into the lungs every 6 (six) hours as needed for wheezing or shortness of breath., Disp: , Rfl:    fluticasone furoate-vilanterol (BREO ELLIPTA) 100-25 MCG/INH AEPB, Inhale 2 puffs into the lungs daily., Disp: , Rfl:    Medications ordered in this encounter:  Meds ordered this encounter  Medications   azithromycin (ZITHROMAX) 250 MG tablet    Sig: Take 2 tablets PO on day one, and one tablet PO daily thereafter until completed.    Dispense:  6 tablet    Refill:  0    Order Specific Question:   Supervising Provider    Answer:   MILLER, BRIAN [[id number]]   predniSONE (STERAPRED UNI-PAK 21 TAB) 10 MG (21) TBPK tablet    Sig: 6 day taper; take as directed on package instructions    Dispense:  21 tablet    Refill:  0    Order Specific Question:   Supervising Provider    Answer:   Hyacinth Meeker, BRIAN [[id number]]    brompheniramine-pseudoephedrine-DM 30-2-10 MG/5ML syrup    Sig: Take 5 mLs by mouth 4 (four) times daily as needed.    Dispense:  120 mL    Refill:  0    Order Specific Question:   Supervising Provider    Answer:   MILLER, BRIAN [[id number]]   benzonatate (TESSALON) 100 MG capsule    Sig: Take 1 capsule (100 mg total) by mouth 3 (three) times daily as needed.    Dispense:  30 capsule    Refill:  0    Order Specific Question:   Supervising Provider    Answer:   Hyacinth Meeker, BRIAN [[id number]]     *If you need refills on other medications prior to your next appointment, please contact your pharmacy*  Follow-Up: Call back or seek an in-person evaluation if the symptoms worsen or if the condition fails to improve as anticipated.  Other Instructions See below    If you have been instructed to have an in-person evaluation today at a local Urgent Care facility, please use the link below. It will take you to a list of all of our available [facility], including address, phone number and hours of operation. Please do not delay care.  [facility]  If you or a family member do not have a primary care provider, use the link below to schedule a visit and establish care. When  you choose a [facility] primary care physician or advanced practice provider, you gain a long-term partner in health. Find a Primary Care Provider  Learn more about [facility]'s in-office and virtual care options: [facility] Now  Acute Bronchitis, Adult Acute bronchitis is when air tubes in the lungs (bronchi) suddenly get swollen. The condition can make it hard for you to breathe. In adults, acute bronchitis usually goes away within 2 weeks. A cough caused by bronchitis may last up to 3 weeks. Smoking, allergies, and asthma can make the condition worse. What are the causes? Germs that cause cold and flu (viruses). The most common cause of this condition is the virus that causes the common  cold. Bacteria. Substances that bother (irritate) the lungs, including: Smoke from cigarettes and other types of tobacco. Dust and pollen. Fumes from chemicals, gases, or burned fuel. Indoor or outdoor air pollution. What increases the risk? A weak body's defense system. This is also called the immune system. Any condition that affects your lungs and breathing, such as asthma. What are the signs or symptoms? A cough. Coughing up clear, yellow, or green mucus. Making high-pitched whistling sounds when you breathe, most often when you breathe out (wheezing). Runny or stuffy nose. Having too much mucus in your lungs (chest congestion). Shortness of breath. Body aches. A sore throat. How is this treated? Acute bronchitis may go away over time without treatment. Your doctor may tell you to: Drink more fluids. This will help thin your mucus so it is easier to cough up. Use a device that gets medicine into your lungs (inhaler). Use a vaporizer or a humidifier. These are machines that add water to the air. This helps with coughing and poor breathing. Take a medicine that thins mucus and helps clear it from your lungs. Take a medicine that prevents or stops coughing. It is not common to take an antibiotic medicine for this condition. Follow these instructions at home:  Take over-the-counter and prescription medicines only as told by your doctor. Use an inhaler, vaporizer, or humidifier as told by your doctor. Take two teaspoons (10 mL) of honey at bedtime. This helps lessen your coughing at night. Drink enough fluid to keep your pee (urine) pale yellow. Do not smoke or use any products that contain nicotine or tobacco. If you need help quitting, ask your doctor. Get a lot of rest. Return to your normal activities when your doctor says that it is safe. Keep all follow-up visits. How is this prevented?  Wash your hands often with soap and water for at least 20 seconds. If you cannot use  soap and water, use hand sanitizer. Avoid contact with people who have cold symptoms. Try not to touch your mouth, nose, or eyes with your hands. Avoid breathing in smoke or chemical fumes. Make sure to get the flu shot every year. Contact a doctor if: Your symptoms do not get better in 2 weeks. You have trouble coughing up the mucus. Your cough keeps you awake at night. You have a fever. Get help right away if: You cough up blood. You have chest pain. You have very bad shortness of breath. You faint or keep feeling like you are going to faint. You have a very bad headache. Your fever or chills get worse. These symptoms may be an emergency. Get help right away. Call your local emergency services (911 in the [place] Do not wait to see if the symptoms will go away. Do not  drive yourself to the hospital. Summary Acute bronchitis is when air tubes in the lungs (bronchi) suddenly get swollen. In adults, acute bronchitis usually goes away within 2 weeks. Drink more fluids. This will help thin your mucus so it is easier to cough up. Take over-the-counter and prescription medicines only as told by your doctor. Contact a doctor if your symptoms do not improve after 2 weeks of treatment. This information is not intended to replace advice given to you by your health care provider. Make sure you discuss any questions you have with your health care provider. Document Revised: 04/06/2021 Document Reviewed: 04/06/2021 Elsevier Patient Education  2022 [employer].

## 2021-11-02 ENCOUNTER — Telehealth: Payer: Medicaid Other

## 2021-11-23 ENCOUNTER — Other Ambulatory Visit: Payer: Self-pay

## 2021-11-23 DIAGNOSIS — N6011 Diffuse cystic mastopathy of right breast: Secondary | ICD-10-CM

## 2021-11-23 DIAGNOSIS — N6310 Unspecified lump in the right breast, unspecified quadrant: Secondary | ICD-10-CM

## 2022-01-06 ENCOUNTER — Other Ambulatory Visit: Payer: Medicaid Other

## 2022-01-19 ENCOUNTER — Inpatient Hospital Stay: Admission: RE | Admit: 2022-01-19 | Payer: Medicaid Other | Source: Ambulatory Visit

## 2022-01-19 ENCOUNTER — Other Ambulatory Visit: Payer: Medicaid Other

## 2022-02-07 ENCOUNTER — Other Ambulatory Visit: Payer: Self-pay | Admitting: Advanced Practice Midwife

## 2022-02-07 DIAGNOSIS — R102 Pelvic and perineal pain: Secondary | ICD-10-CM

## 2022-02-09 ENCOUNTER — Other Ambulatory Visit: Payer: Medicaid Other

## 2022-02-16 ENCOUNTER — Other Ambulatory Visit: Payer: Medicaid Other

## 2022-03-02 ENCOUNTER — Other Ambulatory Visit: Payer: Medicaid Other

## 2022-03-08 ENCOUNTER — Telehealth: Payer: Medicaid Other | Admitting: Physician Assistant

## 2022-03-08 DIAGNOSIS — A084 Viral intestinal infection, unspecified: Secondary | ICD-10-CM | POA: Diagnosis not present

## 2022-03-08 MED ORDER — ONDANSETRON 4 MG PO TBDP: 4.0000 mg | ORAL_TABLET | Freq: Three times a day (TID) | ORAL | 0 refills | Status: DC | PRN

## 2022-03-08 NOTE — Progress Notes (Signed)
?Virtual Visit Consent  ? ?[name], you are scheduled for a virtual visit with a [facility] provider today.   ?  ?Just as with appointments in the office, your consent must be obtained to participate.  Your consent will be active for this visit and any virtual visit you may have with one of our providers in the next 365 days.   ?  ?If you have a MyChart account, a copy of this consent can be sent to you electronically.  All virtual visits are billed to your insurance company just like a traditional visit in the office.   ? ?As this is a virtual visit, video technology does not allow for your provider to perform a traditional examination.  This may limit your provider's ability to fully assess your condition.  If your provider identifies any concerns that need to be evaluated in person or the need to arrange testing (such as labs, EKG, etc.), we will make arrangements to do so.   ?  ?Although advances in technology are sophisticated, we cannot ensure that it will always work on either your end or our end.  If the connection with a video visit is poor, the visit may have to be switched to a telephone visit.  With either a video or telephone visit, we are not always able to ensure that we have a secure connection.    ? ?I need to obtain your verbal consent now.   Are you willing to proceed with your visit today?  ?  ?[name] has provided verbal consent on 03/08/2022 for a virtual visit (video or telephone). ?  ?Piedad Climes, PA-C  ? ?Date: 03/08/2022 7:03 PM ? ? ?Virtual Visit via Video Note  ? ?IPiedad Climes, connected with  [name]  ([id number], [birth year]) on 03/08/22 at  7:30 PM EDT by a video-enabled telemedicine application and verified that I am speaking with the correct person using two identifiers. ? ?Location: ?Patient: Virtual Visit Location Patient: Home ?Provider: Virtual Visit Location Provider: Home Office ?  ?I discussed the limitations of evaluation and management  by telemedicine and the availability of in person appointments. The patient expressed understanding and agreed to proceed.   ? ?History of Present Illness: ?[name] is a 31 y.o. who identifies as a female who was assigned female at birth, and is being seen today for GI symptoms starting Monday evening. Her children and now husband have been dealing with similar symptoms. Patient endorses nausea and vomiting with some diarrhea. Some abdominal cramping. Denies fever but notes occasional sweats. Denies hematemesis, melena, hematochezia or tenesmus. Has not taken anything OTC since this started. Is hydrating very well now that vomiting has stopped. Can eat but not much of an appetite yet. ? ?HPI: HPI  ?Problems:  ?Patient Active Problem List  ? Diagnosis Date Noted  ? SVD (spontaneous vaginal delivery) 09/06/2019  ? Indication for care in labor or delivery 09/05/2019  ?  ?Allergies: No Known Allergies ?Medications:  ?Current Outpatient Medications:  ?  ondansetron (ZOFRAN-ODT) 4 MG disintegrating tablet, Take 1 tablet (4 mg total) by mouth every 8 (eight) hours as needed for nausea or vomiting., Disp: 20 tablet, Rfl: 0 ?  albuterol (PROVENTIL HFA;VENTOLIN HFA) 108 (90 Base) MCG/ACT inhaler, Inhale 1-2 puffs into the lungs every 6 (six) hours as needed for wheezing or shortness of breath., Disp: , Rfl:  ?  fluticasone furoate-vilanterol (BREO ELLIPTA) 100-25 MCG/INH AEPB, Inhale 2 puffs into the  lungs daily., Disp: , Rfl:  ? ?Observations/Objective: ?Patient is well-developed, well-nourished in no acute distress.  ?Resting comfortably at home.  ?Head is normocephalic, atraumatic.  ?No labored breathing. ?Speech is clear and coherent with logical content.  ?Patient is alert and oriented at baseline.  ? ?Assessment and Plan: ?1. Viral gastroenteritis ?- ondansetron (ZOFRAN-ODT) 4 MG disintegrating tablet; Take 1 tablet (4 mg total) by mouth every 8 (eight) hours as needed for nausea or vomiting.  Dispense: 20  tablet; Refill: 0 ? ?Mild. Vomiting has ceased. Still with nausea and diarrhea. Supportive measures and OTC medications reviewed. Zofran per orders. Start [employer] Work note provided.  ? ?Follow Up Instructions: ?I discussed the assessment and treatment plan with the patient. The patient was provided an opportunity to ask questions and all were answered. The patient agreed with the plan and demonstrated an understanding of the instructions.  A copy of instructions were sent to the patient via MyChart unless otherwise noted below.  ? ?The patient was advised to call back or seek an in-person evaluation if the symptoms worsen or if the condition fails to improve as anticipated. ? ?Time:  ?I spent 12 minutes with the patient via telehealth technology discussing the above problems/concerns.   ? ?Piedad Climes, PA-C ?

## 2022-03-08 NOTE — Patient Instructions (Signed)
?Orlie Pollen, thank you for joining Leeanne Rio, PA-C for today's virtual visit.  While this provider is not your primary care provider (PCP), if your PCP is located in our provider database this encounter information will be shared with them immediately following your visit. ? ?Consent: ?(Patient) Orlie Pollen provided verbal consent for this virtual visit at the beginning of the encounter. ? ?Current Medications: ? ?Current Outpatient Medications:  ?  albuterol (PROVENTIL HFA;VENTOLIN HFA) 108 (90 Base) MCG/ACT inhaler, Inhale 1-2 puffs into the lungs every 6 (six) hours as needed for wheezing or shortness of breath., Disp: , Rfl:  ?  azithromycin (ZITHROMAX) 250 MG tablet, Take 2 tablets PO on day one, and one tablet PO daily thereafter until completed., Disp: 6 tablet, Rfl: 0 ?  benzonatate (TESSALON) 100 MG capsule, Take 1 capsule (100 mg total) by mouth 3 (three) times daily as needed., Disp: 30 capsule, Rfl: 0 ?  brompheniramine-pseudoephedrine-DM 30-2-10 MG/5ML syrup, Take 5 mLs by mouth 4 (four) times daily as needed., Disp: 120 mL, Rfl: 0 ?  fluticasone furoate-vilanterol (BREO ELLIPTA) 100-25 MCG/INH AEPB, Inhale 2 puffs into the lungs daily., Disp: , Rfl:  ?  predniSONE (STERAPRED UNI-PAK 21 TAB) 10 MG (21) TBPK tablet, 6 day taper; take as directed on package instructions, Disp: 21 tablet, Rfl: 0  ? ?Medications ordered in this encounter:  ?No orders of the defined types were placed in this encounter. ?  ? ?*If you need refills on other medications prior to your next appointment, please contact your pharmacy* ? ?Follow-Up: ?Call back or seek an in-person evaluation if the symptoms worsen or if the condition fails to improve as anticipated. ? ?Other Instructions ?Please keep well-hydrated and get plenty of rest. ?Start some electrolyte water.  ?Use the zofran as directed to help with nausea and vomiting. ?You can use OTC Imodium if needed.  ? ?Start the dietary recommendations below. ?If  you become unable to keep fluids in -- you need an ER evaluation ASAP. DO NOT DELAY CARE. ? ?Bland Diet ?A bland diet consists of foods that are often soft and do not have a lot of fat, fiber, or extra seasonings. Foods without fat, fiber, or seasoning are easier for the body to digest. They are also less likely to irritate your mouth, throat, stomach, and other parts of your digestive system. A bland diet is sometimes called a BRAT diet. ?What is my plan? ?Your health care provider or food and nutrition specialist (dietitian) may recommend specific changes to your diet to prevent symptoms or to treat your symptoms. These changes may include: ?Eating small meals often. ?Cooking food until it is soft enough to chew easily. ?Chewing your food well. ?Drinking fluids slowly. ?Not eating foods that are very spicy, sour, or fatty. ?Not eating citrus fruits, such as oranges and grapefruit. ?What do I need to know about this diet? ?Eat a variety of foods from the bland diet food list. ?Do not follow a bland diet longer than needed. ?Ask your health care provider whether you should take vitamins or supplements. ?What foods can I eat? ?Grains ?Hot cereals, such as cream of wheat. Rice. Bread, crackers, or tortillas made from refined white flour. ?Vegetables ?Canned or cooked vegetables. Mashed or boiled potatoes. ?Fruits ?Bananas. Applesauce. Other types of cooked or canned fruit with the skin and seeds removed, such as canned peaches or pears. ?Meats and other proteins ?Scrambled eggs. Creamy peanut butter or other nut butters. Lean, well-cooked meats, such as  chicken or fish. Tofu. Soups or broths. ?Dairy ?Low-fat dairy products, such as milk, cottage cheese, or yogurt. ?Beverages ?Water. Herbal tea. Apple juice. ?Fats and oils ?Mild salad dressings. Canola or olive oil. ?Sweets and desserts ?Pudding. Custard. Fruit gelatin. Ice cream. ?The items listed above may not be a complete list of recommended foods and beverages.  Contact a dietitian for more options. ?What foods are not recommended? ?Grains ?Whole grain breads and cereals. ?Vegetables ?Raw vegetables. ?Fruits ?Raw fruits, especially citrus, berries, or dried fruits. ?Dairy ?Whole fat dairy foods. ?Beverages ?Caffeinated drinks. Alcohol. ?Seasonings and condiments ?Strongly flavored seasonings or condiments. Hot sauce. Salsa. ?Other foods ?Spicy foods. Fried foods. Sour foods, such as pickled or fermented foods. Foods with high sugar content. Foods high in fiber. ?The items listed above may not be a complete list of foods and beverages to avoid. Contact a dietitian for more information. ?Summary ?A bland diet consists of foods that are often soft and do not have a lot of fat, fiber, or extra seasonings. ?Foods without fat, fiber, or seasoning are easier for the body to digest. ?Check with your health care provider to see how long you should follow this diet plan. It is not meant to be followed for long periods. ?This information is not intended to replace advice given to you by your health care provider. Make sure you discuss any questions you have with your health care provider. ?Document Revised: 01/02/2018 Document Reviewed: 01/02/2018 ?Elsevier Patient Education ? 2022 [place]. ? ? ? ?If you have been instructed to have an in-person evaluation today at a local Urgent Care facility, please use the link below. It will take you to a list of all of our available [facility], including address, phone number and hours of operation. Please do not delay care.  ?[facility] ? ?If you or a family member do not have a primary care provider, use the link below to schedule a visit and establish care. When you choose a [facility] primary care physician or advanced practice provider, you gain a long-term partner in health. ?Find a Primary Care Provider ? ?Learn more about [facility]'s in-office and virtual care options: ?[facility]  ?

## 2022-03-24 ENCOUNTER — Ambulatory Visit
Admission: RE | Admit: 2022-03-24 | Discharge: 2022-03-24 | Disposition: A | Discharge status: Home / Self Care | Payer: Medicaid Other | Source: Ambulatory Visit

## 2022-03-24 DIAGNOSIS — N6011 Diffuse cystic mastopathy of right breast: Secondary | ICD-10-CM

## 2022-03-24 DIAGNOSIS — N6310 Unspecified lump in the right breast, unspecified quadrant: Secondary | ICD-10-CM

## 2022-05-13 ENCOUNTER — Telehealth: Payer: Medicaid Other | Admitting: Nurse Practitioner

## 2022-05-13 DIAGNOSIS — Z20818 Contact with and (suspected) exposure to other bacterial communicable diseases: Secondary | ICD-10-CM

## 2022-05-13 DIAGNOSIS — J020 Streptococcal pharyngitis: Secondary | ICD-10-CM

## 2022-05-13 MED ORDER — AMOXICILLIN 875 MG PO TABS: 875.0000 mg | ORAL_TABLET | Freq: Two times a day (BID) | ORAL | 0 refills | Status: DC

## 2022-05-13 NOTE — Progress Notes (Signed)
Virtual Visit Consent   [name], you are scheduled for a virtual visit with Mary-Margaret Daphine Deutscher, FNP, a [facility] provider, today.     Just as with appointments in the office, your consent must be obtained to participate.  Your consent will be active for this visit and any virtual visit you may have with one of our providers in the next 365 days.     If you have a MyChart account, a copy of this consent can be sent to you electronically.  All virtual visits are billed to your insurance company just like a traditional visit in the office.    As this is a virtual visit, video technology does not allow for your provider to perform a traditional examination.  This may limit your provider's ability to fully assess your condition.  If your provider identifies any concerns that need to be evaluated in person or the need to arrange testing (such as labs, EKG, etc.), we will make arrangements to do so.     Although advances in technology are sophisticated, we cannot ensure that it will always work on either your end or our end.  If the connection with a video visit is poor, the visit may have to be switched to a telephone visit.  With either a video or telephone visit, we are not always able to ensure that we have a secure connection.     I need to obtain your verbal consent now.   Are you willing to proceed with your visit today? YES   [name] has provided verbal consent on 05/13/2022 for a virtual visit (video or telephone).   Mary-Margaret Daphine Deutscher, FNP   Date: 05/13/2022 6:47 PM   Virtual Visit via Video Note   I, Mary-Margaret Daphine Deutscher, connected with [name] ([id number], 08-17-1991) on 05/13/22 at  7:00 PM EDT by a video-enabled telemedicine application and verified that I am speaking with the correct person using two identifiers.  Location: Patient: Virtual Visit Location Patient: Home Provider: Virtual Visit Location Provider: Mobile   I discussed the limitations  of evaluation and management by telemedicine and the availability of in person appointments. The patient expressed understanding and agreed to proceed.    History of Present Illness: [name] is a 31 y.o. who identifies as a female who was assigned female at birth, and is being seen today for sore throat.  HPI: Patient states that she took her son to the doctor today for sore throat and he tested positive for strep. She also had sore throat and tahy looked in her thorat and said it was red . She also has swollen lymph nodes in her neck. Says she can hrdly swollow now because her throat is so sore.   Review of Systems  Constitutional:  Positive for chills. Negative for fever and malaise/fatigue.  HENT:  Positive for sore throat.   Eyes:  Positive for discharge.  Respiratory:  Positive for cough.   Neurological:  Positive for headaches.   Problems:  Patient Active Problem List   Diagnosis Date Noted   SVD (spontaneous vaginal delivery) 09/06/2019   Indication for care in labor or delivery 09/05/2019    Allergies: No Known Allergies Medications:  Current Outpatient Medications:    albuterol (PROVENTIL HFA;VENTOLIN HFA) 108 (90 Base) MCG/ACT inhaler, Inhale 1-2 puffs into the lungs every 6 (six) hours as needed for wheezing or shortness of breath., Disp: , Rfl:    fluticasone furoate-vilanterol (BREO ELLIPTA) 100-25 MCG/INH  AEPB, Inhale 2 puffs into the lungs daily., Disp: , Rfl:    ondansetron (ZOFRAN-ODT) 4 MG disintegrating tablet, Take 1 tablet (4 mg total) by mouth every 8 (eight) hours as needed for nausea or vomiting., Disp: 20 tablet, Rfl: 0  Observations/Objective: Patient is well-developed, well-nourished in no acute distress.  Resting comfortably  at home.  Head is normocephalic, atraumatic.  No labored breathing.  Speech is clear and coherent with logical content.  Patient is alert and oriented at baseline.  Posterior oral pharynx is erythematous  Assessment and  Plan:  [name] in today with chief complaint of Sore Throat   1. Pharyngitis due to Streptococcus species 2. Strep throat exposure Force fluids Motrin or tylenol OTC OTC decongestant Throat lozenges if help New toothbrush in 3 days  Meds ordered this encounter  Medications   amoxicillin (AMOXIL) 875 MG tablet    Sig: Take 1 tablet (875 mg total) by mouth 2 (two) times daily. 1 po BID    Dispense:  14 tablet    Refill:  0    Order Specific Question:   Supervising Provider    Answer:   Eber Hong [[id number]]       The above assessment and management plan was discussed with the patient. The patient verbalized understanding of and has agreed to the management plan. Patient is aware to call the clinic if symptoms persist or worsen. Patient is aware when to return to the clinic for a follow-up visit. Patient educated on when it is appropriate to go to the emergency department.   Mary-Margaret Daphine Deutscher, FNP    Follow Up Instructions: I discussed the assessment and treatment plan with the patient. The patient was provided an opportunity to ask questions and all were answered. The patient agreed with the plan and demonstrated an understanding of the instructions.  A copy of instructions were sent to the patient via MyChart.  The patient was advised to call back or seek an in-person evaluation if the symptoms worsen or if the condition fails to improve as anticipated.  Time:  I spent 9 minutes with the patient via telehealth technology discussing the above problems/concerns.    Mary-Margaret Daphine Deutscher, FNP

## 2022-05-13 NOTE — Patient Instructions (Signed)
Strep Throat, Adult Strep throat is an infection of the throat. It is caused by germs (bacteria). Strep throat is common during the cold months of the year. It mostly affects children who are 5-31 years old. However, people of all ages can get it at any time of the year. This infection spreads from person to person through coughing, sneezing, or having close contact. What are the causes? This condition is caused by the Streptococcus pyogenes germ. What increases the risk? You care for young children. Children are more likely to get strep throat and may spread it to others. You go to crowded places. Germs can spread easily in such places. You kiss or touch someone who has strep throat. What are the signs or symptoms? Fever or chills. Redness, swelling, or pain in the tonsils or throat. Pain or trouble when swallowing. White or yellow spots on the tonsils or throat. Tender glands in the neck and under the jaw. Bad breath. Red rash all over the body. This is rare. How is this treated? Medicines that kill germs (antibiotics). Medicines that treat pain or fever. These include: Ibuprofen or acetaminophen. Aspirin, only for people who are over the age of 18. Cough drops. Throat sprays. Follow these instructions at home: Medicines  Take over-the-counter and prescription medicines only as told by your doctor. Take your antibiotic medicine as told by your doctor. Do not stop taking the antibiotic even if you start to feel better. Eating and drinking  If you have trouble swallowing, eat soft foods until your throat feels better. Drink enough fluid to keep your pee (urine) pale yellow. To help with pain, you may have: Warm fluids, such as soup and tea. Cold fluids, such as frozen desserts or popsicles. General instructions Rinse your mouth (gargle) with a salt-water mixture 3-4 times a day or as needed. To make a salt-water mixture, dissolve -1 tsp (3-6 g) of salt in 1 cup (237 mL) of warm  water. Rest as much as you can. Stay home from work or school until you have been taking antibiotics for 24 hours. Do not smoke or use any products that contain nicotine or tobacco. If you need help quitting, ask your doctor. Keep all follow-up visits. How is this prevented?  Do not share food, drinking cups, or personal items. They can cause the germs to spread. Wash your hands well with soap and water. Make sure that all people in your house wash their hands well. Have family members tested if they have a fever or a sore throat. They may need an antibiotic if they have strep throat. Contact a doctor if: You have swelling in your neck that keeps getting bigger. You get a rash, cough, or earache. You cough up a thick fluid that is green, yellow-brown, or bloody. You have pain that does not get better with medicine. Your symptoms get worse instead of getting better. You have a fever. Get help right away if: You vomit. You have a very bad headache. Your neck hurts or feels stiff. You have chest pain or are short of breath. You have drooling, very bad throat pain, or changes in your voice. Your neck is swollen, or the skin gets red and tender. Your mouth is dry, or you are peeing less than normal. You keep feeling more tired or have trouble waking up. Your joints are red or painful. These symptoms may be an emergency. Do not wait to see if the symptoms will go away. Get help right away. Call   your local emergency services (911 in the [place] Summary Strep throat is an infection of the throat. It is caused by germs (bacteria). This infection can spread from person to person through coughing, sneezing, or having close contact. Take your medicines, including antibiotics, as told by your doctor. Do not stop taking the antibiotic even if you start to feel better. To prevent the spread of germs, wash your hands well with soap and water. Have others do the same. Do not share food, drinking cups,  or personal items. Get help right away if you have a bad headache, chest pain, shortness of breath, a stiff or painful neck, or you vomit. This information is not intended to replace advice given to you by your health care provider. Make sure you discuss any questions you have with your health care provider. Document Revised: 03/29/2021 Document Reviewed: 03/29/2021 Elsevier Patient Education  2023 Elsevier Inc.  

## 2022-07-07 ENCOUNTER — Telehealth: Payer: Medicaid Other | Admitting: Physician Assistant

## 2022-07-07 DIAGNOSIS — J040 Acute laryngitis: Secondary | ICD-10-CM

## 2022-07-07 MED ORDER — AZITHROMYCIN 250 MG PO TABS: ORAL_TABLET | ORAL | 0 refills | Status: AC

## 2022-07-07 NOTE — Patient Instructions (Signed)
[name], thank you for joining [name], PA-C for today's virtual visit.  While this provider is not your primary care provider (PCP), if your PCP is located in our provider database this encounter information will be shared with them immediately following your visit.  Consent: (Patient) [name] provided verbal consent for this virtual visit at the beginning of the encounter.  Current Medications:  Current Outpatient Medications:    azithromycin (ZITHROMAX) 250 MG tablet, Take 2 tablets on day 1, then 1 tablet daily on days 2 through 5, Disp: 6 tablet, Rfl: 0   albuterol (PROVENTIL HFA;VENTOLIN HFA) 108 (90 Base) MCG/ACT inhaler, Inhale 1-2 puffs into the lungs every 6 (six) hours as needed for wheezing or shortness of breath., Disp: , Rfl:    fluticasone furoate-vilanterol (BREO ELLIPTA) 100-25 MCG/INH AEPB, Inhale 2 puffs into the lungs daily., Disp: , Rfl:    ondansetron (ZOFRAN-ODT) 4 MG disintegrating tablet, Take 1 tablet (4 mg total) by mouth every 8 (eight) hours as needed for nausea or vomiting., Disp: 20 tablet, Rfl: 0   Medications ordered in this encounter:  Meds ordered this encounter  Medications   azithromycin (ZITHROMAX) 250 MG tablet    Sig: Take 2 tablets on day 1, then 1 tablet daily on days 2 through 5    Dispense:  6 tablet    Refill:  0    Order Specific Question:   Supervising Provider    Answer:   Eber Hong [[id number]]     *If you need refills on other medications prior to your next appointment, please contact your pharmacy*  Follow-Up: Call back or seek an in-person evaluation if the symptoms worsen or if the condition fails to improve as anticipated.  Other Instructions Laryngitis  Laryngitis is inflammation of the vocal cords that causes symptoms such as hoarseness or loss of voice. The vocal cords are two bands of muscles in your throat. When you speak, these cords come together and vibrate. The vibrations come out through your  mouth as sound. When your vocal cords are inflamed, your voice sounds different. Laryngitis can be temporary (acute) or long-term (chronic). Most cases of acute laryngitis improve with time. Chronic laryngitis is laryngitis that lasts for more than 3 weeks. What are the causes? Acute laryngitis may be caused by: A viral infection. Lots of talking, yelling, or singing. This is also called vocal strain. A bacterial infection. Chronic laryngitis may be caused by: Vocal strain or an injury to the vocal cords. Acid reflux (gastroesophageal reflux disease, or GERD). Allergies, a sinus infection, or postnasal drip. Smoking. Excessive alcohol use. Breathing in chemicals or dust. Growths on the vocal cords. What increases the risk? The following factors may make you more likely to develop this condition: Smoking. Alcohol abuse. Having allergies. Chronic irritants in the workplace, such as toxic fumes. What are the signs or symptoms? Symptoms of this condition may include: Low, hoarse voice. Loss of voice. Dry cough. Sore or dry throat. Stuffy or congested nose. How is this diagnosed? This condition may be diagnosed based on: Your symptoms and a physical exam. Throat culture. Blood test. A procedure in which your health care provider looks at your vocal cords with a mirror or viewing tube (laryngoscopy). How is this treated? Treatment for laryngitis depends on what is causing it. Usually, treatment involves resting your voice and using medicines to soothe your throat. If your laryngitis is caused by a bacterial infection, you may need to take antibiotic medicine.  If your laryngitis is caused by a growth, you may need to have a procedure to remove it. Follow these instructions at home: Medicines Take over-the-counter and prescription medicines only as told by your health care provider. If you were prescribed an antibiotic medicine, take it as told by your health care provider. Do not  stop taking the antibiotic even if you start to feel better. Use throat lozenges or sprays to soothe your throat as told by your health care provider. General instructions  Talk as little as possible. To do this: Write instead of talking. Do this until your voice is back to normal. Avoid whispering, which can cause vocal strain. Gargle with a mixture of salt and water 3-4 times a day or as needed. To make salt water, completely dissolve -1 tsp (3-6 g) of salt in 1 cup (237 mL) of warm water. Drink enough fluid to keep your urine pale yellow. Breathe in moist air. Use a humidifier if you live in a dry climate. Do not use any products that contain nicotine or tobacco. These products include cigarettes, chewing tobacco, and vaping devices, such as e-cigarettes. If you need help quitting, ask your health care provider. Contact a health care provider if: You have a fever. You have increasing pain. Your symptoms do not get better in 2 weeks. Get help right away if: You cough up blood. You have difficulty swallowing. You have trouble breathing. Summary Laryngitis is inflammation of the vocal cords that causes symptoms such as hoarseness or loss of voice. Laryngitis can be temporary or long-term. Treatment for laryngitis depends on the cause. It often involves resting your voice and using medicine to soothe your throat. Get help right away if you have difficulty swallowing or breathing or if you cough up blood. This information is not intended to replace advice given to you by your health care provider. Make sure you discuss any questions you have with your health care provider. Document Revised: 02/21/2021 Document Reviewed: 02/21/2021 Elsevier Patient Education  2023 Elsevier Inc.    If you have been instructed to have an in-person evaluation today at a local Urgent Care facility, please use the link below. It will take you to a list of all of our available [facility],  including address, phone number and hours of operation. Please do not delay care.  [facility]  If you or a family member do not have a primary care provider, use the link below to schedule a visit and establish care. When you choose a [facility] primary care physician or advanced practice provider, you gain a long-term partner in health. Find a Primary Care Provider  Learn more about [facility]'s in-office and virtual care options: [facility] - Get Care Now

## 2022-07-07 NOTE — Progress Notes (Signed)
Virtual Visit Consent   [name], you are scheduled for a virtual visit with a [facility] provider today. Just as with appointments in the office, your consent must be obtained to participate. Your consent will be active for this visit and any virtual visit you may have with one of our providers in the next 365 days. If you have a MyChart account, a copy of this consent can be sent to you electronically.  As this is a virtual visit, video technology does not allow for your provider to perform a traditional examination. This may limit your provider's ability to fully assess your condition. If your provider identifies any concerns that need to be evaluated in person or the need to arrange testing (such as labs, EKG, etc.), we will make arrangements to do so. Although advances in technology are sophisticated, we cannot ensure that it will always work on either your end or our end. If the connection with a video visit is poor, the visit may have to be switched to a telephone visit. With either a video or telephone visit, we are not always able to ensure that we have a secure connection.  By engaging in this virtual visit, you consent to the provision of healthcare and authorize for your insurance to be billed (if applicable) for the services provided during this visit. Depending on your insurance coverage, you may receive a charge related to this service.  I need to obtain your verbal consent now. Are you willing to proceed with your visit today? [name] has provided verbal consent on 07/07/2022 for a virtual visit (video or telephone). [name], PA-C  Date: 07/07/2022 2:47 PM  Virtual Visit via Video Note   I, [name], connected with  [name]  ([id number], 11-Sep-1991) on 07/07/22 at  2:30 PM EDT by a video-enabled telemedicine application and verified that I am speaking with the correct person using two identifiers.  Location: Patient: Virtual Visit Location  Patient: Home Provider: Virtual Visit Location Provider: Home Office   I discussed the limitations of evaluation and management by telemedicine and the availability of in person appointments. The patient expressed understanding and agreed to proceed.    History of Present Illness: [name] is a 31 y.o. who identifies as a female who was assigned female at birth, and is being seen today for laryngitis.  HPI: URI  This is a new problem. The current episode started in the past 7 days. The problem has been gradually worsening. Maximum temperature: subjective fevers. Associated symptoms include congestion, coughing, headaches, rhinorrhea, a sore throat (strain, scratchy) and swollen glands. Pertinent negatives include no ear pain, nausea, plugged ear sensation, sinus pain or wheezing. Associated symptoms comments: Hoarse voice, post nasal drainage, gagging with mucous, fatigue. She has tried nothing for the symptoms. The treatment provided no relief.     Problems:  Patient Active Problem List   Diagnosis Date Noted   SVD (spontaneous vaginal delivery) 09/06/2019   Indication for care in labor or delivery 09/05/2019    Allergies: No Known Allergies Medications:  Current Outpatient Medications:    azithromycin (ZITHROMAX) 250 MG tablet, Take 2 tablets on day 1, then 1 tablet daily on days 2 through 5, Disp: 6 tablet, Rfl: 0   albuterol (PROVENTIL HFA;VENTOLIN HFA) 108 (90 Base) MCG/ACT inhaler, Inhale 1-2 puffs into the lungs every 6 (six) hours as needed for wheezing or shortness of breath., Disp: , Rfl:    fluticasone furoate-vilanterol (BREO  ELLIPTA) 100-25 MCG/INH AEPB, Inhale 2 puffs into the lungs daily., Disp: , Rfl:    ondansetron (ZOFRAN-ODT) 4 MG disintegrating tablet, Take 1 tablet (4 mg total) by mouth every 8 (eight) hours as needed for nausea or vomiting., Disp: 20 tablet, Rfl: 0  Observations/Objective: Patient is well-developed, well-nourished in no acute distress.   Resting comfortably  at home.  Head is normocephalic, atraumatic.  No labored breathing.  Speech is clear and coherent with logical content.  Patient is alert and oriented at baseline.  Hoarse voice  Assessment and Plan: 1. Laryngitis - azithromycin (ZITHROMAX) 250 MG tablet; Take 2 tablets on day 1, then 1 tablet daily on days 2 through 5  Dispense: 6 tablet; Refill: 0  - Suspect bacterial laryngitis vs co-infection with bacterial bronchitis - Continue Ibuprofen for inflammation - Salt water gargles - Voice rest the best she can - Azithromycin for bacterial infection - Seek in person evaluation if symptoms are worsening or fail to improve  Follow Up Instructions: I discussed the assessment and treatment plan with the patient. The patient was provided an opportunity to ask questions and all were answered. The patient agreed with the plan and demonstrated an understanding of the instructions.  A copy of instructions were sent to the patient via MyChart unless otherwise noted below.    The patient was advised to call back or seek an in-person evaluation if the symptoms worsen or if the condition fails to improve as anticipated.  Time:  I spent 11 minutes with the patient via telehealth technology discussing the above problems/concerns.    [name], PA-C

## 2022-08-17 ENCOUNTER — Other Ambulatory Visit: Payer: Self-pay

## 2022-08-17 ENCOUNTER — Emergency Department (HOSPITAL_BASED_OUTPATIENT_CLINIC_OR_DEPARTMENT_OTHER)
Admission: EM | Admit: 2022-08-17 | Discharge: 2022-08-18 | Disposition: A | Discharge status: Home / Self Care | Payer: Medicaid Other | Attending: Emergency Medicine | Admitting: Emergency Medicine

## 2022-08-17 ENCOUNTER — Emergency Department (HOSPITAL_BASED_OUTPATIENT_CLINIC_OR_DEPARTMENT_OTHER): Payer: Medicaid Other

## 2022-08-17 ENCOUNTER — Encounter (HOSPITAL_BASED_OUTPATIENT_CLINIC_OR_DEPARTMENT_OTHER): Payer: Self-pay | Admitting: Emergency Medicine

## 2022-08-17 DIAGNOSIS — F17200 Nicotine dependence, unspecified, uncomplicated: Secondary | ICD-10-CM | POA: Insufficient documentation

## 2022-08-17 DIAGNOSIS — Z7951 Long term (current) use of inhaled steroids: Secondary | ICD-10-CM | POA: Insufficient documentation

## 2022-08-17 DIAGNOSIS — J45909 Unspecified asthma, uncomplicated: Secondary | ICD-10-CM | POA: Insufficient documentation

## 2022-08-17 DIAGNOSIS — R202 Paresthesia of skin: Secondary | ICD-10-CM | POA: Diagnosis not present

## 2022-08-17 DIAGNOSIS — R200 Anesthesia of skin: Secondary | ICD-10-CM | POA: Insufficient documentation

## 2022-08-17 DIAGNOSIS — R519 Headache, unspecified: Secondary | ICD-10-CM | POA: Diagnosis not present

## 2022-08-17 LAB — COMPREHENSIVE METABOLIC PANEL
ALT: 7 U/L (ref 0–44)
AST: 11 U/L — ABNORMAL LOW (ref 15–41)
Albumin: 4.6 g/dL (ref 3.5–5.0)
Alkaline Phosphatase: 36 U/L — ABNORMAL LOW (ref 38–126)
Anion gap: 12 (ref 5–15)
BUN: 9 mg/dL (ref 6–20)
CO2: 19 mmol/L — ABNORMAL LOW (ref 22–32)
Calcium: 9.4 mg/dL (ref 8.9–10.3)
Chloride: 108 mmol/L (ref 98–111)
Creatinine, Ser: 0.8 mg/dL (ref 0.44–1.00)
GFR, Estimated: >60 mL/min (ref >60)
Glucose, Bld: 98 mg/dL (ref 70–99)
Potassium: 3.2 mmol/L — ABNORMAL LOW (ref 3.5–5.1)
Sodium: 139 mmol/L (ref 135–145)
Total Bilirubin: 0.5 mg/dL (ref 0.3–1.2)
Total Protein: 7.4 g/dL (ref 6.5–8.1)

## 2022-08-17 LAB — CBC WITH DIFFERENTIAL/PLATELET
Abs Immature Granulocytes: 0.02 10*3/uL (ref 0.00–0.07)
Basophils Absolute: 0 10*3/uL (ref 0.0–0.1)
Basophils Relative: 0 %
Eosinophils Absolute: 0.2 10*3/uL (ref 0.0–0.5)
Eosinophils Relative: 3 %
HCT: 39 % (ref 36.0–46.0)
Hemoglobin: 13.3 g/dL (ref 12.0–15.0)
Immature Granulocytes: 0 %
Lymphocytes Relative: 29 %
Lymphs Abs: 1.9 10*3/uL (ref 0.7–4.0)
MCH: 31.3 pg (ref 26.0–34.0)
MCHC: 34.1 g/dL (ref 30.0–36.0)
MCV: 91.8 fL (ref 80.0–100.0)
Monocytes Absolute: 0.4 10*3/uL (ref 0.1–1.0)
Monocytes Relative: 6 %
Neutro Abs: 4.2 10*3/uL (ref 1.7–7.7)
Neutrophils Relative %: 62 %
Platelets: 212 10*3/uL (ref 150–400)
RBC: 4.25 MIL/uL (ref 3.87–5.11)
RDW: 12.5 % (ref 11.5–15.5)
WBC: 6.7 10*3/uL (ref 4.0–10.5)
nRBC: 0 % (ref 0.0–0.2)

## 2022-08-17 LAB — HCG, SERUM, QUALITATIVE: Preg, Serum: NEGATIVE

## 2022-08-17 MED ORDER — LORAZEPAM 2 MG/ML IJ SOLN
0.5000 mg | Freq: Once | INTRAMUSCULAR | Status: AC
  Administered 2022-08-17: 0.5 mg via INTRAVENOUS
  Filled 2022-08-17: qty 1

## 2022-08-17 NOTE — ED Provider Notes (Signed)
[facility] EMERGENCY DEPT Provider Note   CSN: [id number] Arrival date & time: 08/17/22  1813     History  Chief Complaint  Patient presents with   Numbness   Tingling   Headache    [name] is a 31 y.o. female.  HPI     Began as headache and pain to the left side of the face, pain feels more like a tingling, but feels like there is no feeling in left fingers Face has been constant for 3 days Hand tingling/numbness comes and goes, has numbness/tingling to left fingers Has headaches Went to the dentist, thought maybe that's what it was Described it as a tingling Went to urgent care, sent here to get CT Denies other numbness, weakness, difficulty talking or walking, visual changes or facial droop.   No falls, trauma, fever Lightheaded   Hx of sciatica  No hx of migraines   Hx of smoking, asthma, OCP  No hx of early stroke, but is fam hx of stroke in general, lupus  Past Medical History:  Diagnosis Date   Anemia    Asthma    Depression    Generalized anxiety disorder    GERD (gastroesophageal reflux disease)    History of chlamydia     Home Medications Prior to Admission medications   Medication Sig Start Date End Date Taking? Authorizing Provider  albuterol (PROVENTIL HFA;VENTOLIN HFA) 108 (90 Base) MCG/ACT inhaler Inhale 1-2 puffs into the lungs every 6 (six) hours as needed for wheezing or shortness of breath.    [provider]  fluticasone furoate-vilanterol (BREO ELLIPTA) 100-25 MCG/INH AEPB Inhale 2 puffs into the lungs daily.    [provider]  ondansetron (ZOFRAN-ODT) 4 MG disintegrating tablet Take 1 tablet (4 mg total) by mouth every 8 (eight) hours as needed for nausea or vomiting. 03/08/22   Waldon Merl, PA-C      Allergies    Patient has no known allergies.    Review of Systems   Review of Systems  Physical Exam Updated Vital Signs BP 109/77 (BP Location: Right Arm)   Pulse 72   Temp 98.4 F  (36.9 C) (Oral)   Resp 18   Ht 5\' 5"  (1.651 m)   Wt 83.9 kg   SpO2 100%   BMI 30.79 kg/m  Physical Exam  ED Results / Procedures / Treatments   Labs (all labs ordered are listed, but only abnormal results are displayed) Labs Reviewed  COMPREHENSIVE METABOLIC PANEL - Abnormal; Notable for the following components:      Result Value   Potassium 3.2 (*)    CO2 19 (*)    AST 11 (*)    Alkaline Phosphatase 36 (*)    All other components within normal limits  CBC WITH DIFFERENTIAL/PLATELET  HCG, SERUM, QUALITATIVE    EKG None  Radiology MR Brain W and Wo Contrast  Result Date: 08/18/2022 CLINICAL DATA:  Initial evaluation for neuro deficit, stroke suspected. EXAM: MRI HEAD WITHOUT AND WITH CONTRAST TECHNIQUE: Multiplanar, multiecho pulse sequences of the brain and surrounding structures were obtained without and with intravenous contrast. CONTRAST:  8.60mL GADAVIST GADOBUTROL 1 MMOL/ML IV SOLN COMPARISON:  Prior CT from 08/17/2022. FINDINGS: Brain: Cerebral volume within normal limits for patient age. No focal parenchymal signal abnormality identified. No abnormal foci of restricted diffusion to suggest acute or subacute ischemia. Gray-white matter differentiation well maintained. No encephalomalacia to suggest chronic infarction. No foci of susceptibility artifact to suggest acute or chronic intracranial  hemorrhage. No mass lesion, midline shift or mass effect. No hydrocephalus. No extra-axial fluid collection. Pituitary gland and suprasellar region are normal. Midline structures intact and normal. No abnormal enhancement. Vascular: Major intracranial vascular flow voids are maintained. Skull and upper cervical spine: Craniocervical junction normal. Visualized upper cervical spine within normal limits. Bone marrow signal intensity normal. No scalp soft tissue abnormality. Sinuses/Orbits: Globes and orbital soft tissues within normal limits. Moderate mucosal thickening with air-fluid level  present within the left maxillary sinus. Scattered mucosal thickening noted within the ethmoidal air cells and right maxillary sinus as well. No mastoid effusion. Other: None. IMPRESSION: 1. Normal brain MRI. No acute intracranial abnormality identified. 2. Acute left maxillary sinusitis. Electronically Signed   By: Rise Mu M.D.   On: 08/18/2022 02:42   CT Head Wo Contrast  Result Date: 08/17/2022 CLINICAL DATA:  Numbness and tingling for 3-4 days. EXAM: CT HEAD WITHOUT CONTRAST TECHNIQUE: Contiguous axial images were obtained from the base of the skull through the vertex without intravenous contrast. RADIATION DOSE REDUCTION: This exam was performed according to the departmental dose-optimization program which includes automated exposure control, adjustment of the mA and/or kV according to patient size and/or use of iterative reconstruction technique. COMPARISON:  None Available. FINDINGS: Brain: No evidence of acute infarction, hemorrhage, hydrocephalus, extra-axial collection or mass lesion/mass effect. Vascular: No hyperdense vessel or unexpected calcification. Skull: Normal. Negative for fracture or focal lesion. Sinuses/Orbits: There is left maxillary and bilateral ethmoid sinus disease. Other: None. IMPRESSION: No acute intracranial process. Electronically Signed   By: Romona Curls M.D.   On: 08/17/2022 19:17    Procedures Procedures    Medications Ordered in ED Medications  LORazepam (ATIVAN) injection 0.5 mg (0.5 mg Intravenous Given 08/17/22 2125)  gadobutrol (GADAVIST) 1 MMOL/ML injection 8.4 mL (8.4 mLs Intravenous Contrast Given 08/18/22 0106)  ketorolac (TORADOL) 30 MG/ML injection 30 mg (30 mg Intravenous Given 08/18/22 0300)    ED Course/ Medical Decision Making/ A&P                           Medical Decision Making Amount and/or Complexity of Data Reviewed Labs: ordered. Radiology: ordered.  Risk Prescription drug management.   31yo female presents with concern  for left facial and hand numbness/paresthesias and headache.    DDx includes ICH, CVA, MS, complicated migraine, electrolyte abnrmalities, other.  Labs completed and personally evaluated by me show no significant electrolyte abnormality, no anemia..   CT head evaluated by me without acute abnormalities.  Will transfer to [facility] for evaluation of numbness with MRI WWO.        Final Clinical Impression(s) / ED Diagnoses Final diagnoses:  Bad headache  Paresthesias    Rx / DC Orders ED Discharge Orders          Ordered    Ambulatory referral to Neurology       Comments: An appointment is requested in approximately: 8 weeks   08/18/22 0340              Alvira Monday, MD 08/19/22 0010

## 2022-08-17 NOTE — ED Notes (Signed)
Report given to [facility]link and Kelli Churn RN @ [facility] ED

## 2022-08-17 NOTE — ED Triage Notes (Signed)
Pt arrives to ED with c/o numbness and tingling. She reports both symptoms started x3-4 days ago. She reports the numbness and tingling are located to the left side of her face and left hand. She denies any weakness or vision difficulties. Associated symptoms include headache, which pt reports started when the numbness/tingling began.

## 2022-08-18 ENCOUNTER — Emergency Department (HOSPITAL_COMMUNITY): Payer: Medicaid Other

## 2022-08-18 MED ORDER — GADOBUTROL 1 MMOL/ML IV SOLN
8.4000 mL | Freq: Once | INTRAVENOUS | Status: AC | PRN
  Administered 2022-08-18: 8.4 mL via INTRAVENOUS

## 2022-08-18 MED ORDER — KETOROLAC TROMETHAMINE 30 MG/ML IJ SOLN
30.0000 mg | Freq: Once | INTRAMUSCULAR | Status: AC
Start: 2022-08-18 — End: 2022-08-18
  Administered 2022-08-18: 30 mg via INTRAVENOUS
  Filled 2022-08-18: qty 1

## 2022-08-18 NOTE — Discharge Instructions (Signed)
Have placed referral for neurology.  If you do not hear from them in the next few days, please follow-up with the office to make sure appointment gets scheduled. Recommend to follow-up with your primary care doctor in the interim. Make sure you are getting adequate sleep and eating well. Return to the ED for new or worsening symptoms.

## 2022-08-18 NOTE — ED Notes (Signed)
Pt mother voices complaint that patient has not been checked on for hours and that nursing staff have instead been walking by. This RN explained that with mother present and patient being A&Ox4 for that they would ask for help if needed since staff are always present in hallway and there is no call button. This RN has been visually chiecking on patient when passing. Pt has been resting for last 2 hours off and on.  Offered to provide for any needs the patient has. Patient denies any needs beyond wanting medication for HA. Message sent to provider.  Vitals updated and patient updated with plan of care that we are currently waiting for MRI results

## 2022-08-18 NOTE — ED Provider Notes (Signed)
Arrives from [facility] for MRI brain.  Reports headache and some tingling.  No focal deficits noted there.  Initially seen at [facility] and referred to ED for head CT which was negative.   Plan:  MRI brain.  Anticipate discharge if negative.  MR Brain W and Wo Contrast  Result Date: 08/18/2022 CLINICAL DATA:  Initial evaluation for neuro deficit, stroke suspected. EXAM: MRI HEAD WITHOUT AND WITH CONTRAST TECHNIQUE: Multiplanar, multiecho pulse sequences of the brain and surrounding structures were obtained without and with intravenous contrast. CONTRAST:  8.34mL GADAVIST GADOBUTROL 1 MMOL/ML IV SOLN COMPARISON:  Prior CT from 08/17/2022. FINDINGS: Brain: Cerebral volume within normal limits for patient age. No focal parenchymal signal abnormality identified. No abnormal foci of restricted diffusion to suggest acute or subacute ischemia. Gray-white matter differentiation well maintained. No encephalomalacia to suggest chronic infarction. No foci of susceptibility artifact to suggest acute or chronic intracranial hemorrhage. No mass lesion, midline shift or mass effect. No hydrocephalus. No extra-axial fluid collection. Pituitary gland and suprasellar region are normal. Midline structures intact and normal. No abnormal enhancement. Vascular: Major intracranial vascular flow voids are maintained. Skull and upper cervical spine: Craniocervical junction normal. Visualized upper cervical spine within normal limits. Bone marrow signal intensity normal. No scalp soft tissue abnormality. Sinuses/Orbits: Globes and orbital soft tissues within normal limits. Moderate mucosal thickening with air-fluid level present within the left maxillary sinus. Scattered mucosal thickening noted within the ethmoidal air cells and right maxillary sinus as well. No mastoid effusion. Other: None. IMPRESSION: 1. Normal brain MRI. No acute intracranial abnormality identified. 2. Acute left maxillary sinusitis. Electronically Signed   By: Rise Mu M.D.   On: 08/18/2022 02:42   CT Head Wo Contrast  Result Date: 08/17/2022 CLINICAL DATA:  Numbness and tingling for 3-4 days. EXAM: CT HEAD WITHOUT CONTRAST TECHNIQUE: Contiguous axial images were obtained from the base of the skull through the vertex without intravenous contrast. RADIATION DOSE REDUCTION: This exam was performed according to the departmental dose-optimization program which includes automated exposure control, adjustment of the mA and/or kV according to patient size and/or use of iterative reconstruction technique. COMPARISON:  None Available. FINDINGS: Brain: No evidence of acute infarction, hemorrhage, hydrocephalus, extra-axial collection or mass lesion/mass effect. Vascular: No hyperdense vessel or unexpected calcification. Skull: Normal. Negative for fracture or focal lesion. Sinuses/Orbits: There is left maxillary and bilateral ethmoid sinus disease. Other: None. IMPRESSION: No acute intracranial process. Electronically Signed   By: Romona Curls M.D.   On: 08/17/2022 19:17     MRI negative for acute findings. Mother with lots of questions regarding diagnosis.  Apparently she has been having episodes of headaches and periodic tingling for some time-- has been seen at dentist, UC and with PCP for same without real answers.  We have discussed that MRI without acute findings of CVA or lesions suspicious for demyelinating disease such as MS.  Given associated headache, consider complicated migraines. Mother does report a lot of stress at home which may be contributing.  Headache and other symptoms did improve after treatment with toradol here.  She appears stable for discharge.  Will refer to neurology, ambulatory referral placed.  Can return here for new concerns.   Garlon Hatchet, PA-C 08/18/22 [id number]    Marily Memos, MD 08/18/22 0430

## 2022-08-19 NOTE — ED Provider Notes (Incomplete)
[facility] EMERGENCY DEPT Provider Note   CSN: [id number] Arrival date & time: 08/17/22  1813     History {Add pertinent medical, surgical, social history, OB history to HPI:1} Chief Complaint  Patient presents with  . Numbness  . Tingling  . Headache    [name] is a 31 y.o. female.  HPI     Began as headache and pain to the left side of the face, pain feels more like a tingling, but feels like there is no feeling in left fingers Face has been constant for 3 days Hand tingling/numbness comes and goes, has numbness/tingling to left fingers Has headaches Went to the dentist, thought maybe that's what it was Described it as a tingling Went to urgent care, sent here to get CT Denies other numbness, weakness, difficulty talking or walking, visual changes or facial droop.   No falls, trauma, fever Lightheaded   Hx of sciatica  No hx of migraines   Hx of smoking, asthma, OCP  No hx of early stroke, but is fam hx of stroke in general, lupus  Past Medical History:  Diagnosis Date  . Anemia   . Asthma   . Depression   . Generalized anxiety disorder   . GERD (gastroesophageal reflux disease)   . History of chlamydia     Home Medications Prior to Admission medications   Medication Sig Start Date End Date Taking? Authorizing Provider  albuterol (PROVENTIL HFA;VENTOLIN HFA) 108 (90 Base) MCG/ACT inhaler Inhale 1-2 puffs into the lungs every 6 (six) hours as needed for wheezing or shortness of breath.    [provider]  fluticasone furoate-vilanterol (BREO ELLIPTA) 100-25 MCG/INH AEPB Inhale 2 puffs into the lungs daily.    [provider]  ondansetron (ZOFRAN-ODT) 4 MG disintegrating tablet Take 1 tablet (4 mg total) by mouth every 8 (eight) hours as needed for nausea or vomiting. 03/08/22   Waldon Merl, PA-C      Allergies    Patient has no known allergies.    Review of Systems   Review of Systems  Physical  Exam Updated Vital Signs BP 109/77 (BP Location: Right Arm)   Pulse 72   Temp 98.4 F (36.9 C) (Oral)   Resp 18   Ht 5\' 5"  (1.651 m)   Wt 83.9 kg   SpO2 100%   BMI 30.79 kg/m  Physical Exam  ED Results / Procedures / Treatments   Labs (all labs ordered are listed, but only abnormal results are displayed) Labs Reviewed  COMPREHENSIVE METABOLIC PANEL - Abnormal; Notable for the following components:      Result Value   Potassium 3.2 (*)    CO2 19 (*)    AST 11 (*)    Alkaline Phosphatase 36 (*)    All other components within normal limits  CBC WITH DIFFERENTIAL/PLATELET  HCG, SERUM, QUALITATIVE    EKG None  Radiology MR Brain W and Wo Contrast  Result Date: 08/18/2022 CLINICAL DATA:  Initial evaluation for neuro deficit, stroke suspected. EXAM: MRI HEAD WITHOUT AND WITH CONTRAST TECHNIQUE: Multiplanar, multiecho pulse sequences of the brain and surrounding structures were obtained without and with intravenous contrast. CONTRAST:  8.45mL GADAVIST GADOBUTROL 1 MMOL/ML IV SOLN COMPARISON:  Prior CT from 08/17/2022. FINDINGS: Brain: Cerebral volume within normal limits for patient age. No focal parenchymal signal abnormality identified. No abnormal foci of restricted diffusion to suggest acute or subacute ischemia. Gray-white matter differentiation well maintained. No encephalomalacia to suggest chronic infarction. No foci  of susceptibility artifact to suggest acute or chronic intracranial hemorrhage. No mass lesion, midline shift or mass effect. No hydrocephalus. No extra-axial fluid collection. Pituitary gland and suprasellar region are normal. Midline structures intact and normal. No abnormal enhancement. Vascular: Major intracranial vascular flow voids are maintained. Skull and upper cervical spine: Craniocervical junction normal. Visualized upper cervical spine within normal limits. Bone marrow signal intensity normal. No scalp soft tissue abnormality. Sinuses/Orbits: Globes and  orbital soft tissues within normal limits. Moderate mucosal thickening with air-fluid level present within the left maxillary sinus. Scattered mucosal thickening noted within the ethmoidal air cells and right maxillary sinus as well. No mastoid effusion. Other: None. IMPRESSION: 1. Normal brain MRI. No acute intracranial abnormality identified. 2. Acute left maxillary sinusitis. Electronically Signed   By: Rise Mu M.D.   On: 08/18/2022 02:42   CT Head Wo Contrast  Result Date: 08/17/2022 CLINICAL DATA:  Numbness and tingling for 3-4 days. EXAM: CT HEAD WITHOUT CONTRAST TECHNIQUE: Contiguous axial images were obtained from the base of the skull through the vertex without intravenous contrast. RADIATION DOSE REDUCTION: This exam was performed according to the departmental dose-optimization program which includes automated exposure control, adjustment of the mA and/or kV according to patient size and/or use of iterative reconstruction technique. COMPARISON:  None Available. FINDINGS: Brain: No evidence of acute infarction, hemorrhage, hydrocephalus, extra-axial collection or mass lesion/mass effect. Vascular: No hyperdense vessel or unexpected calcification. Skull: Normal. Negative for fracture or focal lesion. Sinuses/Orbits: There is left maxillary and bilateral ethmoid sinus disease. Other: None. IMPRESSION: No acute intracranial process. Electronically Signed   By: Romona Curls M.D.   On: 08/17/2022 19:17    Procedures Procedures  {Document cardiac monitor, telemetry assessment procedure when appropriate:1}  Medications Ordered in ED Medications  LORazepam (ATIVAN) injection 0.5 mg (0.5 mg Intravenous Given 08/17/22 2125)  gadobutrol (GADAVIST) 1 MMOL/ML injection 8.4 mL (8.4 mLs Intravenous Contrast Given 08/18/22 0106)  ketorolac (TORADOL) 30 MG/ML injection 30 mg (30 mg Intravenous Given 08/18/22 0300)    ED Course/ Medical Decision Making/ A&P                           Medical  Decision Making Amount and/or Complexity of Data Reviewed Labs: ordered. Radiology: ordered.  Risk Prescription drug management.   30yo female presents with concern for left facial and hand numbness/paresthesias and headache.    {Document critical care time when appropriate:1} {Document review of labs and clinical decision tools ie heart score, Chads2Vasc2 etc:1}  {Document your independent review of radiology images, and any outside records:1} {Document your discussion with family members, caretakers, and with consultants:1} {Document social determinants of health affecting pt's care:1} {Document your decision making why or why not admission, treatments were needed:1} Final Clinical Impression(s) / ED Diagnoses Final diagnoses:  Bad headache    Rx / DC Orders ED Discharge Orders          Ordered    Ambulatory referral to Neurology       Comments: An appointment is requested in approximately: 8 weeks   08/18/22 0340

## 2022-09-18 ENCOUNTER — Telehealth: Payer: Medicaid Other | Admitting: Physician Assistant

## 2022-09-18 DIAGNOSIS — B9689 Other specified bacterial agents as the cause of diseases classified elsewhere: Secondary | ICD-10-CM

## 2022-09-18 DIAGNOSIS — J208 Acute bronchitis due to other specified organisms: Secondary | ICD-10-CM | POA: Diagnosis not present

## 2022-09-18 MED ORDER — ALBUTEROL SULFATE HFA 108 (90 BASE) MCG/ACT IN AERS: 2.0000 | INHALATION_SPRAY | Freq: Four times a day (QID) | RESPIRATORY_TRACT | 0 refills | Status: DC | PRN

## 2022-09-18 MED ORDER — AZITHROMYCIN 250 MG PO TABS: ORAL_TABLET | ORAL | 0 refills | Status: AC

## 2022-09-18 MED ORDER — BENZONATATE 100 MG PO CAPS: 100.0000 mg | ORAL_CAPSULE | Freq: Three times a day (TID) | ORAL | 0 refills | Status: DC | PRN

## 2022-09-18 NOTE — Progress Notes (Signed)
Virtual Visit Consent   [name], you are scheduled for a virtual visit with a [facility] provider today. Just as with appointments in the office, your consent must be obtained to participate. Your consent will be active for this visit and any virtual visit you may have with one of our providers in the next 365 days. If you have a MyChart account, a copy of this consent can be sent to you electronically.  As this is a virtual visit, video technology does not allow for your provider to perform a traditional examination. This may limit your provider's ability to fully assess your condition. If your provider identifies any concerns that need to be evaluated in person or the need to arrange testing (such as labs, EKG, etc.), we will make arrangements to do so. Although advances in technology are sophisticated, we cannot ensure that it will always work on either your end or our end. If the connection with a video visit is poor, the visit may have to be switched to a telephone visit. With either a video or telephone visit, we are not always able to ensure that we have a secure connection.  By engaging in this virtual visit, you consent to the provision of healthcare and authorize for your insurance to be billed (if applicable) for the services provided during this visit. Depending on your insurance coverage, you may receive a charge related to this service.  I need to obtain your verbal consent now. Are you willing to proceed with your visit today? [name] has provided verbal consent on 09/18/2022 for a virtual visit (video or telephone). Piedad Climes, [place]  Date: 09/18/2022 10:03 AM  Virtual Visit via Video Note   I, Piedad Climes, connected with  [name]  ([id number], Mar 03, 1991) on 09/18/22 at 10:00 AM EDT by a video-enabled telemedicine application and verified that I am speaking with the correct person using two identifiers.  Location: Patient: Virtual Visit  Location Patient: Home Provider: Virtual Visit Location Provider: Home Office   I discussed the limitations of evaluation and management by telemedicine and the availability of in person appointments. The patient expressed understanding and agreed to proceed.    History of Present Illness: [name] is a 31 y.o. who identifies as a female who was assigned female at birth, and is being seen today for progressing URI symptoms. Notes son and daughter with croup last week. She had initial fatigue, fever, congestion and cough. Felt was the same thing the children had. Fever and chills resolved but coughing has continued and now has become productive of thick green sputum. Notes some continued nasal congestion but without sinus pain or tooth pain. Is smoker but refraining from tobacco use during illness. Has history of asthma. Denies chest pain or SOB. Is out of her albuterol inhaler. Taking OTC Robitussin. Marland Kitchen  HPI: HPI  Problems:  Patient Active Problem List   Diagnosis Date Noted   SVD (spontaneous vaginal delivery) 09/06/2019   Indication for care in labor or delivery 09/05/2019    Allergies: No Known Allergies Medications:  Current Outpatient Medications:    albuterol (VENTOLIN HFA) 108 (90 Base) MCG/ACT inhaler, Inhale 2 puffs into the lungs every 6 (six) hours as needed for wheezing or shortness of breath., Disp: 8 g, Rfl: 0   azithromycin (ZITHROMAX) 250 MG tablet, Take 2 tablets on day 1, then 1 tablet daily on days 2 through 5, Disp: 6 tablet, Rfl: 0   benzonatate (TESSALON)  100 MG capsule, Take 1 capsule (100 mg total) by mouth 3 (three) times daily as needed for cough., Disp: 30 capsule, Rfl: 0   ELLA 30 MG tablet, Take by mouth., Disp: , Rfl:    fluticasone furoate-vilanterol (BREO ELLIPTA) 100-25 MCG/INH AEPB, Inhale 2 puffs into the lungs daily., Disp: , Rfl:   Observations/Objective: Patient is well-developed, well-nourished in no acute distress.  Resting comfortably at home.   Head is normocephalic, atraumatic.  No labored breathing. Speech is clear and coherent with logical content.  Patient is alert and oriented at baseline.   Assessment and Plan: 1. Acute bacterial bronchitis - benzonatate (TESSALON) 100 MG capsule; Take 1 capsule (100 mg total) by mouth 3 (three) times daily as needed for cough.  Dispense: 30 capsule; Refill: 0 - albuterol (VENTOLIN HFA) 108 (90 Base) MCG/ACT inhaler; Inhale 2 puffs into the lungs every 6 (six) hours as needed for wheezing or shortness of breath.  Dispense: 8 g; Refill: 0 - azithromycin (ZITHROMAX) 250 MG tablet; Take 2 tablets on day 1, then 1 tablet daily on days 2 through 5  Dispense: 6 tablet; Refill: 0  Development of secondary bronchitis after recent exposure to parainfluenza virus and viral symptoms. Higher risk of this due to being active smoker. Rx Azithromycin.  Increase fluids.  Rest.  Saline nasal spray.  Probiotic.  Mucinex as directed.  Humidifier in bedroom. Tessalon and Albuterol per orders.  Call or return to clinic if symptoms are not improving.   Follow Up Instructions: I discussed the assessment and treatment plan with the patient. The patient was provided an opportunity to ask questions and all were answered. The patient agreed with the plan and demonstrated an understanding of the instructions.  A copy of instructions were sent to the patient via MyChart unless otherwise noted below.   The patient was advised to call back or seek an in-person evaluation if the symptoms worsen or if the condition fails to improve as anticipated.  Time:  I spent 10 minutes with the patient via telehealth technology discussing the above problems/concerns.    Leeanne Rio, PA-C

## 2022-09-18 NOTE — Patient Instructions (Signed)
[name], thank you for joining Piedad Climes, PA-C for today's virtual visit.  While this provider is not your primary care provider (PCP), if your PCP is located in our provider database this encounter information will be shared with them immediately following your visit.  Consent: (Patient) [name] provided verbal consent for this virtual visit at the beginning of the encounter.  Current Medications:  Current Outpatient Medications:    albuterol (PROVENTIL HFA;VENTOLIN HFA) 108 (90 Base) MCG/ACT inhaler, Inhale 1-2 puffs into the lungs every 6 (six) hours as needed for wheezing or shortness of breath., Disp: , Rfl:    fluticasone furoate-vilanterol (BREO ELLIPTA) 100-25 MCG/INH AEPB, Inhale 2 puffs into the lungs daily., Disp: , Rfl:    ondansetron (ZOFRAN-ODT) 4 MG disintegrating tablet, Take 1 tablet (4 mg total) by mouth every 8 (eight) hours as needed for nausea or vomiting., Disp: 20 tablet, Rfl: 0   Medications ordered in this encounter:  No orders of the defined types were placed in this encounter.    *If you need refills on other medications prior to your next appointment, please contact your pharmacy*  Follow-Up: Call back or seek an in-person evaluation if the symptoms worsen or if the condition fails to improve as anticipated.  [facility] [phone]  Other Instructions Take antibiotic (Azithromycin) as directed.  Increase fluids.  Get plenty of rest. Use Mucinex for congestion. Use tessalon as directed for cough. I have refilled your albuterol inhaler. Take a daily probiotic (I recommend Align or Culturelle, but even Activia Yogurt may be beneficial).  A humidifier placed in the bedroom may offer some relief for a dry, scratchy throat of nasal irritation.  Read information below on acute bronchitis. Please call or return to clinic if symptoms are not improving.  Acute Bronchitis Bronchitis is when the airways that extend from the  windpipe into the lungs get red, puffy, and painful (inflamed). Bronchitis often causes thick spit (mucus) to develop. This leads to a cough. A cough is the most common symptom of bronchitis. In acute bronchitis, the condition usually begins suddenly and goes away over time (usually in 2 weeks). Smoking, allergies, and asthma can make bronchitis worse. Repeated episodes of bronchitis may cause more lung problems.  HOME CARE Rest. Drink enough fluids to keep your pee (urine) clear or pale yellow (unless you need to limit fluids as told by your doctor). Only take over-the-counter or prescription medicines as told by your doctor. Avoid smoking and secondhand smoke. These can make bronchitis worse. If you are a smoker, think about using nicotine gum or skin patches. Quitting smoking will help your lungs heal faster. Reduce the chance of getting bronchitis again by: Washing your hands often. Avoiding people with cold symptoms. Trying not to touch your hands to your mouth, nose, or eyes. Follow up with your doctor as told.  GET HELP IF: Your symptoms do not improve after 1 week of treatment. Symptoms include: Cough. Fever. Coughing up thick spit. Body aches. Chest congestion. Chills. Shortness of breath. Sore throat.  GET HELP RIGHT AWAY IF:  You have an increased fever. You have chills. You have severe shortness of breath. You have bloody thick spit (sputum). You throw up (vomit) often. You lose too much body fluid (dehydration). You have a severe headache. You faint.  MAKE SURE YOU:  Understand these instructions. Will watch your condition. Will get help right away if you are not doing well or get worse. Document Released:  05/22/2008 Document Revised: 08/06/2013 Document Reviewed: 05/27/2013 [facility] Patient Information 2015 [place], [place]. This information is not intended to replace advice given to you by your health care provider. Make sure you discuss any questions you have  with your health care provider.    If you have been instructed to have an in-person evaluation today at a local Urgent Care facility, please use the link below. It will take you to a list of all of our available [facility], including address, phone number and hours of operation. Please do not delay care.  [facility]  If you or a family member do not have a primary care provider, use the link below to schedule a visit and establish care. When you choose a [facility] primary care physician or advanced practice provider, you gain a long-term partner in health. Find a Primary Care Provider  Learn more about [facility]'s in-office and virtual care options: [facility]

## 2022-09-19 ENCOUNTER — Ambulatory Visit: Payer: Medicaid Other | Admitting: Neurology

## 2022-09-19 ENCOUNTER — Encounter: Payer: Self-pay | Admitting: Neurology

## 2022-10-12 DIAGNOSIS — F411 Generalized anxiety disorder: Secondary | ICD-10-CM | POA: Insufficient documentation

## 2022-10-17 DIAGNOSIS — M5431 Sciatica, right side: Secondary | ICD-10-CM | POA: Insufficient documentation

## 2022-10-17 DIAGNOSIS — R202 Paresthesia of skin: Secondary | ICD-10-CM | POA: Insufficient documentation

## 2022-11-07 ENCOUNTER — Telehealth: Payer: Self-pay | Admitting: Neurology

## 2022-11-07 ENCOUNTER — Encounter: Payer: Self-pay | Admitting: Neurology

## 2022-11-07 ENCOUNTER — Ambulatory Visit: Payer: Medicaid Other | Admitting: Neurology

## 2022-11-07 NOTE — Telephone Encounter (Signed)
Ok to discharge, please let Angie know

## 2022-11-07 NOTE — Telephone Encounter (Signed)
FYI pt has missed two new patient appointments

## 2022-11-08 ENCOUNTER — Encounter: Payer: Self-pay | Admitting: Neurology

## 2022-12-13 DIAGNOSIS — F17290 Nicotine dependence, other tobacco product, uncomplicated: Secondary | ICD-10-CM | POA: Insufficient documentation

## 2023-04-03 ENCOUNTER — Telehealth: Payer: Medicaid Other | Admitting: Physician Assistant

## 2023-04-03 DIAGNOSIS — S90562A Insect bite (nonvenomous), left ankle, initial encounter: Secondary | ICD-10-CM

## 2023-04-03 DIAGNOSIS — T7840XA Allergy, unspecified, initial encounter: Secondary | ICD-10-CM | POA: Diagnosis not present

## 2023-04-03 DIAGNOSIS — W57XXXA Bitten or stung by nonvenomous insect and other nonvenomous arthropods, initial encounter: Secondary | ICD-10-CM

## 2023-04-03 MED ORDER — PREDNISONE 10 MG (21) PO TBPK: ORAL_TABLET | ORAL | 0 refills | Status: DC

## 2023-04-03 MED ORDER — DOXYCYCLINE HYCLATE 100 MG PO TABS: 100.0000 mg | ORAL_TABLET | Freq: Two times a day (BID) | ORAL | 0 refills | Status: DC

## 2023-04-03 NOTE — Patient Instructions (Signed)
[name], thank you for joining [name], PA-C for today's virtual visit.  While this provider is not your primary care provider (PCP), if your PCP is located in our provider database this encounter information will be shared with them immediately following your visit.   A [facility] MyChart account gives you access to today's visit and all your visits, tests, and labs performed at [facility] " click here if you don't have a [facility] MyChart account or go to [email or web address]  Consent: (Patient) [name] provided verbal consent for this virtual visit at the beginning of the encounter.  Current Medications:  Current Outpatient Medications:    doxycycline (VIBRA-TABS) 100 MG tablet, Take 1 tablet (100 mg total) by mouth 2 (two) times daily., Disp: 20 tablet, Rfl: 0   predniSONE (STERAPRED UNI-PAK 21 TAB) 10 MG (21) TBPK tablet, 6 day taper; take as directed on package instructions, Disp: 21 tablet, Rfl: 0   albuterol (VENTOLIN HFA) 108 (90 Base) MCG/ACT inhaler, Inhale 2 puffs into the lungs every 6 (six) hours as needed for wheezing or shortness of breath., Disp: 8 g, Rfl: 0   benzonatate (TESSALON) 100 MG capsule, Take 1 capsule (100 mg total) by mouth 3 (three) times daily as needed for cough., Disp: 30 capsule, Rfl: 0   ELLA 30 MG tablet, Take by mouth., Disp: , Rfl:    fluticasone furoate-vilanterol (BREO ELLIPTA) 100-25 MCG/INH AEPB, Inhale 2 puffs into the lungs daily., Disp: , Rfl:    Medications ordered in this encounter:  Meds ordered this encounter  Medications   predniSONE (STERAPRED UNI-PAK 21 TAB) 10 MG (21) TBPK tablet    Sig: 6 day taper; take as directed on package instructions    Dispense:  21 tablet    Refill:  0    Order Specific Question:   Supervising Provider    Answer:   Merrilee Jansky [[id number]]   doxycycline (VIBRA-TABS) 100 MG tablet    Sig: Take 1 tablet (100 mg total) by mouth 2 (two) times daily.     Dispense:  20 tablet    Refill:  0    Order Specific Question:   Supervising Provider    Answer:   Merrilee Jansky [id number]     *If you need refills on other medications prior to your next appointment, please contact your pharmacy*  Follow-Up: Call back or seek an in-person evaluation if the symptoms worsen or if the condition fails to improve as anticipated.  [facility] [phone]  Other Instructions  Tick Bite Information, Adult  Ticks are insects that draw blood for food. They climb onto people and animals that brush against the leaves and grasses that they live in. They then bite and attach to the skin. Most ticks are harmless, but some ticks may carry germs that can cause disease. These germs are spread to a person through a bite. To lower your risk of getting a disease from a tick bite, make sure you: Take steps to prevent tick bites. Check for ticks after being outdoors where ticks live. Watch for symptoms of disease if a tick attached to you or if you think a tick bit you. How can I prevent tick bites? Take these steps to help prevent tick bites when you go outdoors in an area where ticks live: Before you go outdoors: Wear long sleeves and long pants to protect your skin from ticks. Wear light-colored clothing so you can see ticks easier.  Tuck your pant legs into your socks. Apply insect repellent that has DEET (20% or higher), picaridin, or IR3535 in it to the following areas: Any bare skin. Avoid areas around the eyes and mouth. Edges of clothing, like the top of your boots, the bottom of your pant legs, and your sleeve cuffs. Consider applying an insect repellant that contains permethrin. Follow the instructions on the label. Do not apply permethrin directly to the skin. Instead, apply to the following areas: Clothing and shoes. Outdoor gear and tents. When you are outdoors: Avoid walking through areas with long grass. If you are walking on a trail,  stay in the middle of the trail so your skin, hair, and clothing do not touch the bushes. Check for ticks on your clothing, hair, and skin often while you are outdoors. Check again before you go inside. When you go indoors: Check your clothing for ticks. Tumble dry clothes in a dryer on high heat for at least 10 minutes. If clothes are damp, additional time may be needed. If clothes require washing, use hot water. Check your gear and pets. Shower soon after being outdoors. Check your body for ticks. Do a full body check using a mirror. Be sure to check your scalp, neck, armpits, waist, groin, and joint areas. These are the spots where ticks attach themselves most often. What is the best way to remove a tick?  Remove the tick as soon as possible. Removing it can prevent germs from passing to your body. Do not remove the tick with your bare fingers. Do not try to remove a tick with heat, alcohol, petroleum jelly, or fingernail polish. These things can cause the tick to salivate and regurgitate into your bloodstream, increasing your risk of getting a disease. To remove a tick that is crawling on your skin: Go outside and brush the tick off. Use tape or a lint roller. To remove a tick that is attached to your skin: Wash your hands. If you have gloves, put them on. Use a fine-tipped tweezer, curved forceps, or a tick-removal tool to gently grasp the tick as close to your skin and the tick's head as possible. Gently pull with a steady, upward, and even pressure until the tick lets go. While removing the tick: Take care to keep the tick's head attached to its body. Do not twist or jerk the tick. This can make the tick's head or mouth parts break off and stay in your skin. If this happens, try to remove the mouth parts with tweezers. If you cannot remove them, leave the area alone and let the skin heal. Do not squeeze or crush the tick's body. This could force disease-carrying fluids from the tick into  your body. What should I do after removing a tick? Clean the bite area and your hands with soap and water, rubbing alcohol, or an iodine scrub. If an antiseptic cream or ointment is available, put a small amount on the bite area. Wash and disinfect any tools that you used to remove the tick. How should I dispose of a tick? To dispose of a live tick, use one of these methods: Place it in rubbing alcohol. Place it in a sealed bag or container, and throw it away. Wrap it tightly in tape, and throw it away. Flush it down the toilet. Where to find more information Centers for Disease Control and Prevention: GyrateAtrophy.si U.S. Environmental Protection Agency: RelocationNetworking.fi Contact a health care provider if: You have symptoms of a disease after  a tick bite. Symptoms of a tick-borne disease can occur from moments after the tick bites to 30 days after a tick is removed. Symptoms include: Fever or chills. A red rash that makes a circle (bull's-eye rash) in the bite area. Redness and swelling in the bite area. Headache or stiff neck. Muscle, joint, or bone pain. Abnormal tiredness. Numbness in your legs or trouble walking or moving your legs. Tender or swollen lymph glands. Abdominal pain, vomiting, diarrhea, or weight loss. Get help right away if: You are not able to remove a tick. You have muscle weakness or paralysis. Your symptoms get worse or you experience new symptoms. You find an engorged tick on your skin and you are in an area where there is a higher risk of disease from ticks. Summary Ticks may carry germs that can spread to a person through a bite. These germs can cause disease. Wear protective clothing and use insect repellent to prevent tick bites. Follow the instructions on the label. If you find a tick on your body, remove it as soon as possible. If the tick is attached, do not try to remove it with heat, alcohol, petroleum jelly, or fingernail polish. If you have  symptoms of a disease after being bitten by a tick, contact a health care provider. This information is not intended to replace advice given to you by your health care provider. Make sure you discuss any questions you have with your health care provider. Document Revised: 03/06/2022 Document Reviewed: 03/06/2022 Elsevier Patient Education  2023 Elsevier Inc.    If you have been instructed to have an in-person evaluation today at a local Urgent Care facility, please use the link below. It will take you to a list of all of our available [facility], including address, phone number and hours of operation. Please do not delay care.  [facility]  If you or a family member do not have a primary care provider, use the link below to schedule a visit and establish care. When you choose a [facility] primary care physician or advanced practice provider, you gain a long-term partner in health. Find a Primary Care Provider  Learn more about [facility] in-office and virtual care options: [facility] - Get Care Now

## 2023-04-03 NOTE — Progress Notes (Signed)
Virtual Visit Consent   [name], you are scheduled for a virtual visit with a [facility] provider today. Just as with appointments in the office, your consent must be obtained to participate. Your consent will be active for this visit and any virtual visit you may have with one of our providers in the next 365 days. If you have a MyChart account, a copy of this consent can be sent to you electronically.  As this is a virtual visit, video technology does not allow for your provider to perform a traditional examination. This may limit your provider's ability to fully assess your condition. If your provider identifies any concerns that need to be evaluated in person or the need to arrange testing (such as labs, EKG, etc.), we will make arrangements to do so. Although advances in technology are sophisticated, we cannot ensure that it will always work on either your end or our end. If the connection with a video visit is poor, the visit may have to be switched to a telephone visit. With either a video or telephone visit, we are not always able to ensure that we have a secure connection.  By engaging in this virtual visit, you consent to the provision of healthcare and authorize for your insurance to be billed (if applicable) for the services provided during this visit. Depending on your insurance coverage, you may receive a charge related to this service.  I need to obtain your verbal consent now. Are you willing to proceed with your visit today? [name] has provided verbal consent on 04/03/2023 for a virtual visit (video or telephone). Cynthia Loveless, PA-C  Date: 04/03/2023 3:18 PM  Virtual Visit via Video Note   I, [name], connected with  [name]  ([id number], 1991-06-11) on 04/03/23 at  3:15 PM EDT by a video-enabled telemedicine application and verified that I am speaking with the correct person using two identifiers.  Location: Patient: Virtual Visit Location  Patient: Home Provider: Virtual Visit Location Provider: Home Office   I discussed the limitations of evaluation and management by telemedicine and the availability of in person appointments. The patient expressed understanding and agreed to proceed.    History of Present Illness: [name] is a 32 y.o. who identifies as a female who was assigned female at birth, and is being seen today for tick bite. Left ankle involved. Worked in yard Sunday, 04/01/23. Yesterday, 04/02/23, when driving to work mentioned feeling like something was biting her to her mom. When she got to work she saw the tick on her shoe but reports she could see that it appeared it had previously been attached, "it got my blood." Then today she started noticing itching and swelling while at work located around the left medial ankle where the tick had bit. With being up on her feet today, she is now starting to feel some pins and needles sensation into the left foot as well. She has tried topical benadryl cream. It did help relieve the itching but wore off quickly. There is warmth and swelling over the medial malleolus of the left ankle. No redness. No drainage. Denies fevers, chills, nausea and vomiting.    Problems:  Patient Active Problem List   Diagnosis Date Noted   SVD (spontaneous vaginal delivery) 09/06/2019   Indication for care in labor or delivery 09/05/2019    Allergies: No Known Allergies Medications:  Current Outpatient Medications:    doxycycline (VIBRA-TABS) 100 MG tablet, Take  1 tablet (100 mg total) by mouth 2 (two) times daily., Disp: 20 tablet, Rfl: 0   predniSONE (STERAPRED UNI-PAK 21 TAB) 10 MG (21) TBPK tablet, 6 day taper; take as directed on package instructions, Disp: 21 tablet, Rfl: 0   albuterol (VENTOLIN HFA) 108 (90 Base) MCG/ACT inhaler, Inhale 2 puffs into the lungs every 6 (six) hours as needed for wheezing or shortness of breath., Disp: 8 g, Rfl: 0   benzonatate (TESSALON) 100 MG capsule,  Take 1 capsule (100 mg total) by mouth 3 (three) times daily as needed for cough., Disp: 30 capsule, Rfl: 0   ELLA 30 MG tablet, Take by mouth., Disp: , Rfl:    fluticasone furoate-vilanterol (BREO ELLIPTA) 100-25 MCG/INH AEPB, Inhale 2 puffs into the lungs daily., Disp: , Rfl:   Observations/Objective: Patient is well-developed, well-nourished in no acute distress.  Resting comfortably at home.  Head is normocephalic, atraumatic.  No labored breathing.  Speech is clear and coherent with logical content.  Patient is alert and oriented at baseline.  Swelling noted over the left medial malleolus. In video appears to be about the size of a ping pong ball localized area of swelling.  Assessment and Plan: 1. Tick bite of left ankle, initial encounter - predniSONE (STERAPRED UNI-PAK 21 TAB) 10 MG (21) TBPK tablet; 6 day taper; take as directed on package instructions  Dispense: 21 tablet; Refill: 0 - doxycycline (VIBRA-TABS) 100 MG tablet; Take 1 tablet (100 mg total) by mouth 2 (two) times daily.  Dispense: 20 tablet; Refill: 0  2. Allergic reaction, initial encounter - predniSONE (STERAPRED UNI-PAK 21 TAB) 10 MG (21) TBPK tablet; 6 day taper; take as directed on package instructions  Dispense: 21 tablet; Refill: 0  - Suspect allergic reaction from tick bite - Prednisone given as above, start ASAP - Topical benadryl can be continued - Can use oral benadryl and/or a daily non-drowsy antihistamine - Topical hydrocortisone if needed - Doxycycline since tick was attached possibly up to 24 hours, true time unknown - Strict ER precautions verbally discussed, patient voiced understanding - Seek in person evaluation if symptoms worsen or fail to improve  Follow Up Instructions: I discussed the assessment and treatment plan with the patient. The patient was provided an opportunity to ask questions and all were answered. The patient agreed with the plan and demonstrated an understanding of the  instructions.  A copy of instructions were sent to the patient via MyChart unless otherwise noted below.    The patient was advised to call back or seek an in-person evaluation if the symptoms worsen or if the condition fails to improve as anticipated.  Time:  I spent 11 minutes with the patient via telehealth technology discussing the above problems/concerns.    Cynthia Loveless, PA-C

## 2023-05-07 ENCOUNTER — Telehealth: Payer: Medicaid Other | Admitting: Family Medicine

## 2023-05-07 DIAGNOSIS — M5430 Sciatica, unspecified side: Secondary | ICD-10-CM

## 2023-05-07 DIAGNOSIS — M25551 Pain in right hip: Secondary | ICD-10-CM

## 2023-05-07 NOTE — Progress Notes (Signed)
[place]   Needs in person eval given the sciatica and tightness like a rubber band around the hips.  Patient acknowledged agreement and understanding of the plan.

## 2023-08-19 ENCOUNTER — Telehealth: Payer: Medicaid Other | Admitting: Emergency Medicine

## 2023-08-19 DIAGNOSIS — B9689 Other specified bacterial agents as the cause of diseases classified elsewhere: Secondary | ICD-10-CM | POA: Diagnosis not present

## 2023-08-19 DIAGNOSIS — J208 Acute bronchitis due to other specified organisms: Secondary | ICD-10-CM

## 2023-08-19 MED ORDER — AZITHROMYCIN 250 MG PO TABS: ORAL_TABLET | ORAL | 0 refills | Status: AC

## 2023-08-19 NOTE — Progress Notes (Signed)
Virtual Visit Consent   [name], you are scheduled for a virtual visit with a [facility] provider today. Just as with appointments in the office, your consent must be obtained to participate. Your consent will be active for this visit and any virtual visit you may have with one of our providers in the next 365 days. If you have a MyChart account, a copy of this consent can be sent to you electronically.  As this is a virtual visit, video technology does not allow for your provider to perform a traditional examination. This may limit your provider's ability to fully assess your condition. If your provider identifies any concerns that need to be evaluated in person or the need to arrange testing (such as labs, EKG, etc.), we will make arrangements to do so. Although advances in technology are sophisticated, we cannot ensure that it will always work on either your end or our end. If the connection with a video visit is poor, the visit may have to be switched to a telephone visit. With either a video or telephone visit, we are not always able to ensure that we have a secure connection.  By engaging in this virtual visit, you consent to the provision of healthcare and authorize for your insurance to be billed (if applicable) for the services provided during this visit. Depending on your insurance coverage, you may receive a charge related to this service.  I need to obtain your verbal consent now. Are you willing to proceed with your visit today? [name] has provided verbal consent on 08/19/2023 for a virtual visit (video or telephone). Cathlyn Parsons, NP  Date: 08/19/2023 11:28 AM  Virtual Visit via Video Note   I, Cathlyn Parsons, connected with  [name]  ([id number], [birth year]) on 08/19/23 at 11:30 AM EDT by a video-enabled telemedicine application and verified that I am speaking with the correct person using two identifiers.  Location: Patient: Virtual Visit Location Patient:  Home Provider: Virtual Visit Location Provider: Home Office   I discussed the limitations of evaluation and management by telemedicine and the availability of in person appointments. The patient expressed understanding and agreed to proceed.    History of Present Illness: [name] is a 32 y.o. who identifies as a female who was assigned female at birth, and is being seen today for productive cough with purulent sputum for several days. 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 Denies congestion or post nasal drainage. Does have hx of asthma. Is not taking breo ellipta - never got it filled. Does not often use albuterol when not sick. Is taking honey for cough that helps some and using albuterol 4 puffs at a time. No fever  HPI: HPI  Problems:  Patient Active Problem List   Diagnosis Date Noted   SVD (spontaneous vaginal delivery) 09/06/2019   Indication for care in labor or delivery 09/05/2019    Allergies: No Known Allergies Medications:  Current Outpatient Medications:    azithromycin (ZITHROMAX) 250 MG tablet, Take 2 tablets on day 1, then 1 tablet daily on days 2 through 5, Disp: 6 tablet, Rfl: 0   albuterol (VENTOLIN HFA) 108 (90 Base) MCG/ACT inhaler, Inhale 2 puffs into the lungs every 6 (six) hours as needed for wheezing or shortness of breath., Disp: 8 g, Rfl: 0   benzonatate (TESSALON) 100 MG capsule, Take 1 capsule (100 mg total) by mouth 3 (  three) times daily as needed for cough., Disp: 30 capsule, Rfl: 0   doxycycline (VIBRA-TABS) 100 MG tablet, Take 1 tablet (100 mg total) by mouth 2 (two) times daily., Disp: 20 tablet, Rfl: 0   ELLA 30 MG tablet, Take by mouth., Disp: , Rfl:    fluticasone furoate-vilanterol (BREO ELLIPTA) 100-25 MCG/INH AEPB, Inhale 2 puffs into the lungs daily., Disp: , Rfl:    predniSONE (STERAPRED UNI-PAK 21 TAB) 10 MG (21) TBPK tablet, 6 day taper; take as directed on package instructions, Disp: 21 tablet, Rfl:  0  Observations/Objective: Patient is well-developed, well-nourished in no acute distress.  Resting comfortably  at home.  Head is normocephalic, atraumatic.  No labored breathing.  Speech is clear and coherent with logical content.  Patient is alert and oriented at baseline.    Assessment and Plan: 1. Acute bacterial bronchitis  Will treat for bacterial bronchitis  Follow Up Instructions: I discussed the assessment and treatment plan with the patient. The patient was provided an opportunity to ask questions and all were answered. The patient agreed with the plan and demonstrated an understanding of the instructions.  A copy of instructions were sent to the patient via MyChart unless otherwise noted below.   The patient was advised to call back or seek an in-person evaluation if the symptoms worsen or if the condition fails to improve as anticipated.  Time:  I spent 10 minutes with the patient via telehealth technology discussing the above problems/concerns.    Cathlyn Parsons, NP

## 2023-08-19 NOTE — Patient Instructions (Signed)
  [name], thank you for joining Cathlyn Parsons, NP for today's virtual visit.  While this provider is not your primary care provider (PCP), if your PCP is located in our provider database this encounter information will be shared with them immediately following your visit.   A [facility] MyChart account gives you access to today's visit and all your visits, tests, and labs performed at [facility] " click here if you don't have a [facility] MyChart account or go to [email or web address]  Consent: (Patient) [name] provided verbal consent for this virtual visit at the beginning of the encounter.  Current Medications:  Current Outpatient Medications:    azithromycin (ZITHROMAX) 250 MG tablet, Take 2 tablets on day 1, then 1 tablet daily on days 2 through 5, Disp: 6 tablet, Rfl: 0   albuterol (VENTOLIN HFA) 108 (90 Base) MCG/ACT inhaler, Inhale 2 puffs into the lungs every 6 (six) hours as needed for wheezing or shortness of breath., Disp: 8 g, Rfl: 0   benzonatate (TESSALON) 100 MG capsule, Take 1 capsule (100 mg total) by mouth 3 (three) times daily as needed for cough., Disp: 30 capsule, Rfl: 0   doxycycline (VIBRA-TABS) 100 MG tablet, Take 1 tablet (100 mg total) by mouth 2 (two) times daily., Disp: 20 tablet, Rfl: 0   ELLA 30 MG tablet, Take by mouth., Disp: , Rfl:    fluticasone furoate-vilanterol (BREO ELLIPTA) 100-25 MCG/INH AEPB, Inhale 2 puffs into the lungs daily., Disp: , Rfl:    predniSONE (STERAPRED UNI-PAK 21 TAB) 10 MG (21) TBPK tablet, 6 day taper; take as directed on package instructions, Disp: 21 tablet, Rfl: 0   Medications ordered in this encounter:  Meds ordered this encounter  Medications   azithromycin (ZITHROMAX) 250 MG tablet    Sig: Take 2 tablets on day 1, then 1 tablet daily on days 2 through 5    Dispense:  6 tablet    Refill:  0     *If you need refills on other medications prior to your next appointment, please contact your  pharmacy*  Follow-Up: Call back or seek an in-person evaluation if the symptoms worsen or if the condition fails to improve as anticipated.  [facility] [phone]  Other Instructions Continue using honey as needed to control cough.   Use albuterol 2 puffs only every 4-6 hours if needed for shortness of breath or wheezing.   If you need albuterol more than twice a week when you are well, talk with your doctor about asthma prevention medicine   If you have been instructed to have an in-person evaluation today at a local Urgent Care facility, please use the link below. It will take you to a list of all of our available [facility], including address, phone number and hours of operation. Please do not delay care.  [facility]  If you or a family member do not have a primary care provider, use the link below to schedule a visit and establish care. When you choose a [facility] primary care physician or advanced practice provider, you gain a long-term partner in health. Find a Primary Care Provider  Learn more about [facility]'s in-office and virtual care options: [facility] - Get Care Now

## 2023-10-23 DIAGNOSIS — R102 Pelvic and perineal pain: Secondary | ICD-10-CM | POA: Insufficient documentation

## 2023-10-23 DIAGNOSIS — R87619 Unspecified abnormal cytological findings in specimens from cervix uteri: Secondary | ICD-10-CM | POA: Insufficient documentation

## 2023-11-02 ENCOUNTER — Encounter: Payer: Self-pay | Admitting: Obstetrics and Gynecology

## 2023-12-20 DIAGNOSIS — Z6832 Body mass index (BMI) 32.0-32.9, adult: Secondary | ICD-10-CM | POA: Insufficient documentation

## 2024-01-01 ENCOUNTER — Encounter: Payer: Self-pay | Admitting: Obstetrics and Gynecology

## 2024-01-01 ENCOUNTER — Ambulatory Visit: Payer: Medicaid Other | Admitting: Obstetrics and Gynecology

## 2024-01-01 VITALS — BP 104/72 | HR 82 | Ht 65.5 in | Wt 201.0 lb

## 2024-01-01 DIAGNOSIS — N72 Inflammatory disease of cervix uteri: Secondary | ICD-10-CM

## 2024-01-01 DIAGNOSIS — Z113 Encounter for screening for infections with a predominantly sexual mode of transmission: Secondary | ICD-10-CM

## 2024-01-01 DIAGNOSIS — B977 Papillomavirus as the cause of diseases classified elsewhere: Secondary | ICD-10-CM

## 2024-01-01 DIAGNOSIS — N83299 Other ovarian cyst, unspecified side: Secondary | ICD-10-CM | POA: Diagnosis not present

## 2024-01-01 DIAGNOSIS — G43109 Migraine with aura, not intractable, without status migrainosus: Secondary | ICD-10-CM | POA: Insufficient documentation

## 2024-01-01 DIAGNOSIS — N912 Amenorrhea, unspecified: Secondary | ICD-10-CM | POA: Diagnosis not present

## 2024-01-01 DIAGNOSIS — N898 Other specified noninflammatory disorders of vagina: Secondary | ICD-10-CM | POA: Diagnosis not present

## 2024-01-01 LAB — WET PREP FOR TRICH, YEAST, CLUE

## 2024-01-01 MED ORDER — MEDROXYPROGESTERONE ACETATE 10 MG PO TABS: 10.0000 mg | ORAL_TABLET | Freq: Every day | ORAL | 12 refills | Status: DC

## 2024-01-01 NOTE — Assessment & Plan Note (Addendum)
 Reviewed that Depo Provera can cause amenorrhea up to 12 months in some cases. Will check hormones and plan for progestin challenge Patient has contraindication to estrogen Desires regular cycle without contraception. Use provera cyclically.

## 2024-01-01 NOTE — Patient Instructions (Signed)
 Take pregnancy test monthly prior to starting provera.

## 2024-01-01 NOTE — Assessment & Plan Note (Signed)
 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 Reviewed ASCCP guidelines. Recommendations for cotest in 1 year were made based off of guidelines. I also recommend completion of Gardasil vaccine and smoking cessation. All questions answered

## 2024-01-01 NOTE — Progress Notes (Signed)
 33 y.o. [name] female here for review of abnormal Pap smear, amenorrhea, right ovarian cyst, migraine with aura. Married. 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  No LMP recorded (lmp unknown). Patient has had an injection.   She reports she had an ultrasound about a month ago, and it showed cyst on right ovary.   Her last PAP also showed HPV. She reports her husband was unfaithful last year.  Pt states she hasn't had a menstrual in over a year. Pt was on depo injection and stopped getting injections in March 2024. She was on Depo for 2 years.  2024 NIL, other +HR HPV 2023 ASCUS, HPV negative (per pt report) 2022 ASCUS, HPV negative (per pt report) Prior GYN care at [facility]  Smoker, decreased from 1/2ppd, 3-4 cigs per day Completed Gardasil  Birth control: None Sexually active: Yes  GYN HISTORY: No significant history  OB History  Gravida Para Term Preterm AB Living  5 2 2  3 2   SAB IAB Ectopic Multiple Live Births  2 1  0 2    # Outcome Date GA Lbr Len/2nd Weight Sex Type Anes PTL Lv  5 Term 09/06/19 [redacted]w[redacted]d / 00:33 7 lb 10.4 oz (3.47 kg) M Vag-Spont EPI  LIV  4 IAB 2015          3 Term 12/26/11 [redacted]w[redacted]d 14:04 / 00:34 7 lb 5.1 oz (3.32 kg) F Vag-Spont EPI  LIV  2 SAB           1 SAB             Past Medical History:  Diagnosis Date   Anemia    Asthma    Depression    Generalized anxiety disorder    GERD (gastroesophageal reflux disease)    History of chlamydia    SVD (spontaneous vaginal delivery) 09/06/2019    Past Surgical History:  Procedure Laterality Date   DILATION AND CURETTAGE OF UTERUS      Current Outpatient Medications on File Prior to Visit  Medication Sig Dispense Refill   albuterol  (VENTOLIN  HFA) 108 (90 Base) MCG/ACT inhaler Inhale 2 puffs into the lungs every 6 (six) hours as needed for wheezing or shortness of breath. 8 g 0   sertraline (ZOLOFT) 50 MG tablet Take by mouth.     fluticasone  furoate-vilanterol (BREO ELLIPTA) 100-25 MCG/INH  AEPB Inhale 2 puffs into the lungs daily. (Patient not taking: Reported on 01/01/2024)     No current facility-administered medications on file prior to visit.    No Known Allergies    PE Today's Vitals   01/01/24 1452  BP: 104/72  Pulse: 82  SpO2: 98%  Weight: 201 lb (91.2 kg)  Height: 5' 5.5 (1.664 m)   Body mass index is 32.94 kg/m.  Physical Exam Vitals reviewed. Exam conducted with a chaperone present.  Constitutional:      General: She is not in acute distress.    Appearance: Normal appearance.  HENT:     Head: Normocephalic and atraumatic.     Nose: Nose normal.  Eyes:     Extraocular Movements: Extraocular movements intact.     Conjunctiva/sclera: Conjunctivae normal.  Pulmonary:     Effort: Pulmonary effort is normal.  Genitourinary:    General: Normal vulva.     Exam position: Lithotomy position.     Vagina: Normal. No vaginal discharge.     Cervix: Normal. No cervical motion tenderness, discharge or lesion.     Uterus: Normal. Not  enlarged and not tender.      Adnexa: Right adnexa normal and left adnexa normal.  Musculoskeletal:        General: Normal range of motion.     Cervical back: Normal range of motion.  Neurological:     General: No focal deficit present.     Mental Status: She is alert.  Psychiatric:        Mood and Affect: Mood normal.        Behavior: Behavior normal.      Assessment and Plan:        Amenorrhea due to Depo Provera  Assessment & Plan: Reviewed that Depo Provera  can cause amenorrhea up to 12 months in some cases. Will check hormones and plan for progestin challenge Patient has contraindication to estrogen Desires regular cycle without contraception. Use provera  cyclically.  Orders: -     medroxyPROGESTERone  Acetate; Take 1 tablet (10 mg total) by mouth daily for 10 days. Take to induce menses starting on 1st day of each month.  Dispense: 10 tablet; Refill: 12 -     Thyroid  Panel With TSH -     FSH/LH -      Prolactin -     Testos,Total,Free and SHBG (Female) -     hCG, quantitative, pregnancy  Screen for STD (sexually transmitted disease) -     SURESWAB CT/NG/T. vaginalis  Ovarian cyst, complex Assessment & Plan: 3.3cm complex right ovarian cyst. Reviewed possible etiologies including corpus luteum, hemorrhagic cyst, etc RTO in 1 month for 12wk f/u  Orders: -     US  PELVIS TRANSVAGINAL NON-OB (TV ONLY); Future  Vaginal odor -     WET PREP FOR TRICH, YEAST, CLUE  High risk human papilloma virus (HPV) infection of cervix Assessment & Plan: Reviewed HPV infection in association with cervical cancer. Reviewed ASCCP guidelines. Recommendations for cotest in 1 year were made based off of guidelines. I also recommend completion of Gardasil vaccine and smoking cessation. All questions answered.     Vera LULLA Pa, MD

## 2024-01-01 NOTE — Assessment & Plan Note (Signed)
 3.3cm complex right ovarian cyst. Reviewed possible etiologies including corpus luteum, hemorrhagic cyst, etc RTO in 1 month for 12wk f/u

## 2024-01-02 LAB — SURESWAB CT/NG/T. VAGINALIS
C. trachomatis RNA, TMA: NOT DETECTED
N. gonorrhoeae RNA, TMA: NOT DETECTED
Trichomonas vaginalis RNA: NOT DETECTED

## 2024-01-07 LAB — TESTOS,TOTAL,FREE AND SHBG (FEMALE)
Free Testosterone: 1.5 pg/mL (ref 0.1–6.4)
Sex Hormone Binding: 56.9 nmol/L (ref 17–124)
Testosterone, Total, LC-MS-MS: 13 ng/dL (ref 2–45)

## 2024-01-07 LAB — FSH/LH
FSH: 1.7 m[IU]/mL
LH: 2.4 m[IU]/mL

## 2024-01-07 LAB — THYROID PANEL WITH TSH
Free Thyroxine Index: 1.6 (ref 1.4–3.8)
T3 Uptake: 34 % (ref 22–35)
T4, Total: 4.7 ug/dL — ABNORMAL LOW (ref 5.1–11.9)
TSH: 1.07 m[IU]/L

## 2024-01-07 LAB — PROLACTIN: Prolactin: 7.1 ng/mL

## 2024-01-07 LAB — HCG, QUANTITATIVE, PREGNANCY: HCG, Total, QN: <5 m[IU]/mL

## 2024-01-09 ENCOUNTER — Encounter: Payer: Self-pay | Admitting: Obstetrics and Gynecology

## 2024-01-17 ENCOUNTER — Other Ambulatory Visit: Payer: Self-pay | Admitting: Obstetrics and Gynecology

## 2024-01-17 DIAGNOSIS — N83292 Other ovarian cyst, left side: Secondary | ICD-10-CM

## 2024-01-17 DIAGNOSIS — N912 Amenorrhea, unspecified: Secondary | ICD-10-CM

## 2024-01-17 DIAGNOSIS — N898 Other specified noninflammatory disorders of vagina: Secondary | ICD-10-CM

## 2024-01-17 DIAGNOSIS — B977 Papillomavirus as the cause of diseases classified elsewhere: Secondary | ICD-10-CM

## 2024-01-17 DIAGNOSIS — N83299 Other ovarian cyst, unspecified side: Secondary | ICD-10-CM

## 2024-01-17 DIAGNOSIS — Z113 Encounter for screening for infections with a predominantly sexual mode of transmission: Secondary | ICD-10-CM

## 2024-01-31 ENCOUNTER — Ambulatory Visit: Payer: Medicaid Other | Admitting: Obstetrics and Gynecology

## 2024-01-31 ENCOUNTER — Encounter: Payer: Self-pay | Admitting: Obstetrics and Gynecology

## 2024-01-31 ENCOUNTER — Ambulatory Visit: Payer: Medicaid Other

## 2024-01-31 VITALS — BP 122/76 | HR 95 | Wt 201.0 lb

## 2024-01-31 DIAGNOSIS — N912 Amenorrhea, unspecified: Secondary | ICD-10-CM

## 2024-01-31 DIAGNOSIS — N83299 Other ovarian cyst, unspecified side: Secondary | ICD-10-CM | POA: Diagnosis not present

## 2024-01-31 DIAGNOSIS — N83292 Other ovarian cyst, left side: Secondary | ICD-10-CM | POA: Diagnosis not present

## 2024-01-31 NOTE — Assessment & Plan Note (Signed)
Ultrasound reviewed with patient normal today.  No evidence of right ovarian cyst.  No additional follow-up recommended.

## 2024-01-31 NOTE — Assessment & Plan Note (Addendum)
Hormonal workup reviewed and normal. Patient did have a positive progestin challenge in January. Patient would like to continue cyclical Provera for regulated cycle. Desires regular cycle without contraception.  Recommend holding medication in the month of March to determine if her cycle resumes spontaneously. To call with any questions.  Follow follow-up in November for annual exam.

## 2024-01-31 NOTE — Progress Notes (Signed)
33 y.o. [id number] female with abnormal Pap smear, amenorrhea (likely secondary to Depo-Provera), right ovarian cyst, migraine with aura, tobacco use here for TVUS for right ovarian cyst surveillance. Married. 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  Patient's last menstrual period was 01/14/2024 (approximate).   At her last appointment on 01/01/2024, she noted: "She had an ultrasound about a month ago, and it showed cyst on right ovary.  Her last PAP also showed HPV. She reports her husband was unfaithful last year.  Pt states she hasn't had a menstrual in over a year. Pt was on depo injection and stopped getting injections in March 2024. She was on Depo for 2 years."  Today, she reports doing well.  She started Provera on January 26 and her bleeding started on the 27.  She would like to continue cyclical Provera as she desires a monthly cycle at this point.  10/23/2023 NIL, other +HR HPV (Care Everywhere) 2023 ASCUS, HPV negative (per pt report) 2022 ASCUS, HPV negative (per pt report) [facility]  Workup to date: 01/01/2024 wet prep, CT, GC, trichomonas, TSH, prolactin, testosterone all within normal limits. FSH low normal at 1.7  Smoker, decreased from 1/2ppd, 3-4 cigs per day Completed Gardasil  Birth control: None Sexually active: Yes  GYN HISTORY: No significant history  OB History  Gravida Para Term Preterm AB Living  5 2 2  3 2   SAB IAB Ectopic Multiple Live Births  2 1  0 2    # Outcome Date GA Lbr Len/2nd Weight Sex Type Anes PTL Lv  5 Term 09/06/19 [redacted]w[redacted]d / 00:33 7 lb 10.4 oz (3.47 kg) M Vag-Spont EPI  LIV  4 IAB 2015          3 Term 12/26/11 [redacted]w[redacted]d 14:04 / 00:34 7 lb 5.1 oz (3.32 kg) F Vag-Spont EPI  LIV  2 SAB           1 SAB             Past Medical History:  Diagnosis Date   Anemia    Asthma    Depression    Generalized anxiety disorder    GERD (gastroesophageal reflux disease)    History of chlamydia    SVD  (spontaneous vaginal delivery) 09/06/2019    Past Surgical History:  Procedure Laterality Date   DILATION AND CURETTAGE OF UTERUS      Current Outpatient Medications on File Prior to Visit  Medication Sig Dispense Refill   albuterol (VENTOLIN HFA) 108 (90 Base) MCG/ACT inhaler Inhale 2 puffs into the lungs every 6 (six) hours as needed for wheezing or shortness of breath. 8 g 0   fluticasone furoate-vilanterol (BREO ELLIPTA) 100-25 MCG/INH AEPB Inhale 2 puffs into the lungs daily.     sertraline (ZOLOFT) 50 MG tablet Take by mouth.     medroxyPROGESTERone (PROVERA) 10 MG tablet Take 1 tablet (10 mg total) by mouth daily for 10 days. Take to induce menses starting on 1st day of each month. 10 tablet 12   No current facility-administered medications on file prior to visit.    No Known Allergies    PE Today's Vitals   01/31/24 0851  BP: 122/76  Pulse: 95  SpO2: 98%  Weight: 201 lb (91.2 kg)   Body mass index is 32.94 kg/m.  Physical Exam Vitals reviewed.  Constitutional:      General: She is not in acute distress.  Appearance: Normal appearance.  HENT:     Head: Normocephalic and atraumatic.     Nose: Nose normal.  Eyes:     Extraocular Movements: Extraocular movements intact.     Conjunctiva/sclera: Conjunctivae normal.  Pulmonary:     Effort: Pulmonary effort is normal.  Musculoskeletal:        General: Normal range of motion.     Cervical back: Normal range of motion.  Neurological:     General: No focal deficit present.     Mental Status: She is alert.  Psychiatric:        Mood and Affect: Mood normal.        Behavior: Behavior normal.      01/31/2024 TVUS: Indications: Right ovarian cyst, amenorrhea  Findings:   Uterus: 9.0 x 5.9 x 4.5 cm, anteverted. Endometrial thickness: 5 mm. Left ovary: 2.8 x 2.4 x 2.6 cm, 8.7 cm3, normal-appearing. Right ovary: 4.3 x 2.6 x 2.7 cm, 15.8 cm3, normal-appearing. No free fluid.  Impression:  Normal  ultrasound.  Rosalyn Gess, MD   Assessment and Plan:        Ovarian cyst, complex Assessment & Plan: Ultrasound reviewed with patient normal today.  No evidence of right ovarian cyst.  No additional follow-up recommended.   Amenorrhea due to Depo Provera Assessment & Plan: Hormonal workup reviewed and normal. Patient did have a positive progestin challenge in January. Patient would like to continue cyclical Provera for regulated cycle. Desires regular cycle without contraception.  Recommend holding medication in the month of March to determine if her cycle resumes spontaneously. To call with any questions.  Follow follow-up in November for annual exam.    25 mins  total time was spent for this patient encounter, including preparation, face-to-face counseling with the patient, coordination of care, and documentation of the encounter.   Rosalyn Gess, MD

## 2024-02-01 ENCOUNTER — Ambulatory Visit: Payer: Medicaid Other | Admitting: Obstetrics and Gynecology

## 2024-03-25 ENCOUNTER — Telehealth: Admitting: Nurse Practitioner

## 2024-03-25 DIAGNOSIS — H66002 Acute suppurative otitis media without spontaneous rupture of ear drum, left ear: Secondary | ICD-10-CM | POA: Diagnosis not present

## 2024-03-25 MED ORDER — CETIRIZINE HCL 10 MG PO TABS: 10.0000 mg | ORAL_TABLET | Freq: Every day | ORAL | 3 refills | Status: DC

## 2024-03-25 MED ORDER — AMOXICILLIN-POT CLAVULANATE 875-125 MG PO TABS: 1.0000 | ORAL_TABLET | Freq: Two times a day (BID) | ORAL | 0 refills | Status: DC

## 2024-03-25 MED ORDER — FLUTICASONE PROPIONATE 50 MCG/ACT NA SUSP: 2.0000 | Freq: Every day | NASAL | 6 refills | Status: AC

## 2024-03-25 NOTE — Progress Notes (Signed)
 Virtual Visit Consent   [name], you are scheduled for a virtual visit with a [facility] provider today. Just as with appointments in the office, your consent must be obtained to participate. Your consent will be active for this visit and any virtual visit you may have with one of our providers in the next 365 days. If you have a MyChart account, a copy of this consent can be sent to you electronically.  As this is a virtual visit, video technology does not allow for your provider to perform a traditional examination. This may limit your provider's ability to fully assess your condition. If your provider identifies any concerns that need to be evaluated in person or the need to arrange testing (such as labs, EKG, etc.), we will make arrangements to do so. Although advances in technology are sophisticated, we cannot ensure that it will always work on either your end or our end. If the connection with a video visit is poor, the visit may have to be switched to a telephone visit. With either a video or telephone visit, we are not always able to ensure that we have a secure connection.  By engaging in this virtual visit, you consent to the provision of healthcare and authorize for your insurance to be billed (if applicable) for the services provided during this visit. Depending on your insurance coverage, you may receive a charge related to this service.  I need to obtain your verbal consent now. Are you willing to proceed with your visit today? [name] has provided verbal consent on 03/25/2024 for a virtual visit (video or telephone). Viviano Simas, FNP  Date: 03/25/2024 6:41 PM   Virtual Visit via Video Note   I, Viviano Simas, connected with  [name]  ([id number], January 22, 1991) on 03/25/24 at  7:45 PM EDT by a video-enabled telemedicine application and verified that I am speaking with the correct person using two identifiers.  Location: Patient: Virtual Visit Location Patient:  Home Provider: Virtual Visit Location Provider: Home Office   I discussed the limitations of evaluation and management by telemedicine and the availability of in person appointments. The patient expressed understanding and agreed to proceed.    History of Present Illness: [name] is a 33 y.o. who identifies as a female who was assigned female at birth, and is being seen today for ear pain.   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 and she was told that it was very red   She has sinus pressure on the left side as well   She is taking zyrtec    Problems:  Patient Active Problem List   Diagnosis Date Noted   Amenorrhea due to Depo Provera 01/01/2024   Ovarian cyst, complex 01/01/2024   Migraine with aura 01/01/2024   High risk human papilloma virus (HPV) infection of cervix 01/01/2024   Adult BMI 32.0-32.9 kg/sq m 12/20/2023   Abnormal cervical Papanicolaou smear 10/23/2023   Pelvic pain in female 10/23/2023   Cigar smoker motivated to quit 12/13/2022   Bilateral sciatica 10/17/2022   Paresthesias 10/17/2022   Generalized anxiety disorder 10/12/2022    Allergies: No Known Allergies Medications:  Current Outpatient Medications:    albuterol (VENTOLIN HFA) 108 (90  Base) MCG/ACT inhaler, Inhale 2 puffs into the lungs every 6 (six) hours as needed for wheezing or shortness of breath., Disp: 8 g, Rfl: 0   fluticasone furoate-vilanterol (BREO ELLIPTA) 100-25 MCG/INH AEPB, Inhale 2 puffs into the lungs daily., Disp: , Rfl:    medroxyPROGESTERone (PROVERA) 10 MG tablet, Take 1 tablet (10 mg total) by mouth daily for 10 days. Take to induce menses starting on 1st day of each month., Disp: 10 tablet, Rfl: 12   sertraline (ZOLOFT) 50 MG tablet, Take by mouth., Disp: ,  Rfl:   Observations/Objective: Patient is well-developed, well-nourished in no acute distress.  Resting comfortably  at home.  Head is normocephalic, atraumatic.  No labored breathing.  Speech is clear and coherent with logical content.  Patient is alert and oriented at baseline.    Assessment and Plan:   1. Non-recurrent acute suppurative otitis media of left ear without spontaneous rupture of tympanic membrane   Meds ordered this encounter  Medications   fluticasone (FLONASE) 50 MCG/ACT nasal spray    Sig: Place 2 sprays into both nostrils daily.    Dispense:  16 g    Refill:  6   cetirizine (ZYRTEC ALLERGY) 10 MG tablet    Sig: Take 1 tablet (10 mg total) by mouth at bedtime.    Dispense:  30 tablet    Refill:  3   amoxicillin-clavulanate (AUGMENTIN) 875-125 MG tablet    Sig: Take 1 tablet by mouth 2 (two) times daily.    Dispense:  20 tablet    Refill:  0    Follow Up Instructions: I discussed the assessment and treatment plan with the patient. The patient was provided an opportunity to ask questions and all were answered. The patient agreed with the plan and demonstrated an understanding of the instructions.  A copy of instructions were sent to the patient via MyChart unless otherwise noted below.    The patient was advised to call back or seek an in-person evaluation if the symptoms worsen or if the condition fails to improve as anticipated.    Viviano Simas, FNP

## 2024-04-14 ENCOUNTER — Telehealth: Payer: Self-pay

## 2024-04-14 ENCOUNTER — Other Ambulatory Visit: Payer: Self-pay

## 2024-04-14 ENCOUNTER — Ambulatory Visit
Admission: RE | Admit: 2024-04-14 | Discharge: 2024-04-14 | Disposition: A | Discharge status: Home / Self Care | Source: Ambulatory Visit | Attending: Physician Assistant | Admitting: Physician Assistant

## 2024-04-14 VITALS — BP 122/83 | HR 77 | Temp 98.6°F | Resp 18 | Ht 65.5 in | Wt 205.0 lb

## 2024-04-14 DIAGNOSIS — Z3201 Encounter for pregnancy test, result positive: Secondary | ICD-10-CM | POA: Diagnosis present

## 2024-04-14 LAB — POCT URINE PREGNANCY: Preg Test, Ur: POSITIVE — AB

## 2024-04-14 NOTE — ED Provider Notes (Signed)
 [name]    CSN: [id number] Arrival date & time: 04/14/24  1324      History   Chief Complaint Chief Complaint  Patient presents with   Possible Pregnancy    Entered by patient    HPI [name] is a 33 y.o. female.   HPI  She reports concerns for possible pregnancy She states her last Depo shot was about a year ago  She reports she is a smoker but quit cold [place] today. She states she usually smokes 4-5 per day   She has started a prenatal vitamin and is taking this since yesterday  She reports some mild cramping She denies vaginal bleeding, changes to vaginal discharge but does reports a fishy vaginal odor  She states she has started taking a Aspirin 81 mg     Past Medical History:  Diagnosis Date   Anemia    Asthma    Depression    Generalized anxiety disorder    GERD (gastroesophageal reflux disease)    History of chlamydia    SVD (spontaneous vaginal delivery) 09/06/2019    Patient Active Problem List   Diagnosis Date Noted   Amenorrhea due to Depo Provera  01/01/2024   Ovarian cyst, complex 01/01/2024   Migraine with aura 01/01/2024   High risk human papilloma virus (HPV) infection of cervix 01/01/2024   Adult BMI 32.0-32.9 kg/sq m 12/20/2023   Abnormal cervical Papanicolaou smear 10/23/2023   Pelvic pain in female 10/23/2023   Cigar smoker motivated to quit 12/13/2022   Bilateral sciatica 10/17/2022   Paresthesias 10/17/2022   Generalized anxiety disorder 10/12/2022    Past Surgical History:  Procedure Laterality Date   DILATION AND CURETTAGE OF UTERUS      OB History     Gravida  5   Para  2   Term  2   Preterm      AB  3   Living  2      SAB  2   IAB  1   Ectopic      Multiple  0   Live Births  2            Home Medications    Prior to Admission medications   Medication Sig Start Date End Date Taking? Authorizing Provider  albuterol  (VENTOLIN  HFA) 108 (90 Base) MCG/ACT inhaler Inhale 2  puffs into the lungs every 6 (six) hours as needed for wheezing or shortness of breath. 09/18/22   Farris Hong, PA-C  amoxicillin -clavulanate (AUGMENTIN ) 875-125 MG tablet Take 1 tablet by mouth 2 (two) times daily. 03/25/24   Mardene Shake, FNP  cetirizine  (ZYRTEC  ALLERGY) 10 MG tablet Take 1 tablet (10 mg total) by mouth at bedtime. 03/25/24 07/23/24  Mardene Shake, FNP  fluticasone  (FLONASE ) 50 MCG/ACT nasal spray Place 2 sprays into both nostrils daily. 03/25/24   Mardene Shake, FNP  fluticasone  furoate-vilanterol (BREO ELLIPTA) 100-25 MCG/INH AEPB Inhale 2 puffs into the lungs daily.    [provider]  medroxyPROGESTERone  (PROVERA ) 10 MG tablet Take 1 tablet (10 mg total) by mouth daily for 10 days. Take to induce menses starting on 1st day of each month. 01/01/24 01/11/24  Romaine Closs, MD  sertraline (ZOLOFT) 50 MG tablet Take by mouth. 12/20/23   [provider]    Family History Family History  Problem Relation Age of Onset   Anesthesia problems Mother    Arthritis Mother    Asthma Brother    Cancer Maternal Grandmother  cervix   Seizures Maternal Grandmother    Hypertension Maternal Grandfather    Cancer Maternal Grandfather        prostate   Hypertension Paternal Grandfather    Seizures Maternal Aunt    Anesthesia problems Maternal Aunt    Hypertension Paternal Aunt    Cancer Paternal Aunt        lung   Hypertension Paternal Uncle    Mental retardation Cousin        cp x2   Breast cancer Neg Hx     Social History Social History   Tobacco Use   Smoking status: Every Day    Current packs/day: 0.25    Average packs/day: 0.3 packs/day for 2.0 years (0.5 ttl pk-yrs)    Types: Cigarettes   Smokeless tobacco: Never  Vaping Use   Vaping status: Never Used  Substance Use Topics   Alcohol use: Yes    Comment: occasionally   Drug use: No     Allergies   Patient has no known allergies.   Review of Systems Review of Systems  Genitourinary:         Vaginal odor      Physical Exam Triage Vital Signs ED Triage Vitals  Encounter Vitals Group     BP 04/14/24 1420 122/83     Systolic BP Percentile --      Diastolic BP Percentile --      Pulse Rate 04/14/24 1420 77     Resp 04/14/24 1420 18     Temp 04/14/24 1420 98.6 F (37 C)     Temp Source 04/14/24 1420 Oral     SpO2 04/14/24 1420 98 %     Weight 04/14/24 1422 205 lb (93 kg)     Height 04/14/24 1422 5' 5.5" (1.664 m)     Head Circumference --      Peak Flow --      Pain Score 04/14/24 1421 0     Pain Loc --      Pain Education --      Exclude from Growth Chart --    No data found.  Updated Vital Signs BP 122/83 (BP Location: Right Arm)   Pulse 77   Temp 98.6 F (37 C) (Oral)   Resp 18   Ht 5' 5.5" (1.664 m)   Wt 205 lb (93 kg)   LMP 03/12/2024 (Exact Date)   SpO2 98%   Breastfeeding No   BMI 33.59 kg/m   Visual Acuity Right Eye Distance:   Left Eye Distance:   Bilateral Distance:    Right Eye Near:   Left Eye Near:    Bilateral Near:     Physical Exam Vitals reviewed.  Constitutional:      General: She is awake.     Appearance: Normal appearance. She is well-developed and well-groomed.  HENT:     Head: Normocephalic and atraumatic.  Eyes:     General: Lids are normal. Gaze aligned appropriately.     Extraocular Movements: Extraocular movements intact.     Conjunctiva/sclera: Conjunctivae normal.  Pulmonary:     Effort: Pulmonary effort is normal.  Neurological:     General: No focal deficit present.     Mental Status: She is alert and oriented to person, place, and time.     GCS: GCS eye subscore is 4. GCS verbal subscore is 5. GCS motor subscore is 6.     Cranial Nerves: No cranial nerve deficit, dysarthria or facial asymmetry.  Psychiatric:  Attention and Perception: Attention and perception normal.        Mood and Affect: Mood and affect normal.        Speech: Speech normal.        Behavior: Behavior normal. Behavior is  cooperative.      UC Treatments / Results  Labs (all labs ordered are listed, but only abnormal results are displayed) Labs Reviewed  POCT URINE PREGNANCY - Abnormal; Notable for the following components:      Result Value   Preg Test, Ur Positive (*)    All other components within normal limits  CERVICOVAGINAL ANCILLARY ONLY    EKG   Radiology No results found.  Procedures Procedures (including critical care time)  Medications Ordered in UC Medications - No data to display  Initial Impression / Assessment and Plan / UC Course  I have reviewed the triage vital signs and the nursing notes.  Pertinent labs & imaging results that were available during my care of the patient were reviewed by me and considered in my medical decision making (see chart for details).      Final Clinical Impressions(s) / UC Diagnoses   Final diagnoses:  Pregnancy test performed, pregnancy confirmed   Patient presents today with concerns for potential pregnancy.  She reports that she is taken 2 pregnancy test at home which were positive.  She would like confirmation testing today.  Urine pregnancy test was positive.  She would also like to have cervicovaginal swab to check for BV, yeast, trichomoniasis, gonorrhea, chlamydia given recent symptoms of vaginal discharge changes.  Results to dictate further management. Recommend patient continues to take prenatal vitamin and continues her smoking cessation efforts.  She is currently taking Zoloft and is concerned about continuing this while pregnant.  Reviewed up-to-date recommendations which do not seem to contraindicate this during pregnancy but I do recommend that she follows up with OB/GYN for further management.  She states that she has resumed taking a baby aspirin as she had to take this with previous pregnancies.  Again reviewed with her that I recommend following up with OB/GYN regarding this as note from OB/GYN states that she should probably not  start aspirin until she is at least 12 weeks.  Patient education materials provided in after visit summary.  ED return precautions were also reviewed and provided in after visit summary.  Follow-up as needed.    Discharge Instructions      You were seen today for concerns for possible pregnancy.  Your urine pregnancy test was positive. We have collected a cervicovaginal swab to check for bacterial vaginosis, trichomonas, yeast, gonorrhea and chlamydia.  We will keep you updated on these results once they are available.  If any medications are indicated based on those results we will send them to the pharmacy that we have on file. Please make sure that you continue to take your prenatal vitamin.  Please speak with your OB/GYN regarding your Zoloft and if any changes need to be made to your medication regimen.  It is very important that you continue to not smoke.  It is not recommended that you use any nicotine replacements as these also have a risk associated with them for pregnant individuals and the fetus.     If at any point you start to have severe abdominal pain, cramping, bleeding or spotting, chest pain, swelling of one of your extremities, severe nausea or vomiting, difficulty breathing please go to the emergency room as these could be signs of  a medical emergency.     ED Prescriptions   None    PDMP not reviewed this encounter.   Jamilee Lafosse E, PA-C 04/14/24 1902

## 2024-04-14 NOTE — ED Triage Notes (Addendum)
 Pt presents to urgent care for possible pregnancy. Pt states she took two tests yesterday and one test this morning, all came back positive. Wants to confirm pregnancy results and did have questions for the provider concerning prenatal care. Pt states LMP was 3/26, fatigue reported.   Mild pelvic cramping also reported. This has resolved. Occurred yesterday 4/27.

## 2024-04-14 NOTE — Telephone Encounter (Signed)
 Patient left a message on the triage voicemail stating that she just found out she is pregnant. She states that she has high risk pregnancies and normally have to take Asprin in the beginning. She is wanting to know if you recommend the same.

## 2024-04-14 NOTE — Telephone Encounter (Signed)
 Patient states her LMP was 03/12/24.  She will call and set up and appointment with OB and start prenatal vitamins as recommended.

## 2024-04-14 NOTE — Discharge Instructions (Addendum)
 You were seen today for concerns for possible pregnancy.  Your urine pregnancy test was positive. We have collected a cervicovaginal swab to check for bacterial vaginosis, trichomonas, yeast, gonorrhea and chlamydia.  We will keep you updated on these results once they are available.  If any medications are indicated based on those results we will send them to the pharmacy that we have on file. Please make sure that you continue to take your prenatal vitamin.  Please speak with your OB/GYN regarding your Zoloft and if any changes need to be made to your medication regimen.  It is very important that you continue to not smoke.  It is not recommended that you use any nicotine replacements as these also have a risk associated with them for pregnant individuals and the fetus.     If at any point you start to have severe abdominal pain, cramping, bleeding or spotting, chest pain, swelling of one of your extremities, severe nausea or vomiting, difficulty breathing please go to the emergency room as these could be signs of a medical emergency.

## 2024-04-15 LAB — CERVICOVAGINAL ANCILLARY ONLY
Bacterial Vaginitis (gardnerella): NEGATIVE
Candida Glabrata: NEGATIVE
Candida Vaginitis: NEGATIVE
Chlamydia: NEGATIVE
Comment: NEGATIVE
Comment: NEGATIVE
Comment: NEGATIVE
Comment: NEGATIVE
Comment: NEGATIVE
Comment: NORMAL
Neisseria Gonorrhea: NEGATIVE
Trichomonas: NEGATIVE

## 2024-04-18 ENCOUNTER — Ambulatory Visit

## 2024-04-18 NOTE — Progress Notes (Signed)
 Negative cervicovaginal swab. No changes to management plan at this time.

## 2024-04-21 ENCOUNTER — Ambulatory Visit: Admitting: Obstetrics and Gynecology

## 2024-04-21 ENCOUNTER — Inpatient Hospital Stay (HOSPITAL_COMMUNITY)

## 2024-04-21 ENCOUNTER — Inpatient Hospital Stay (HOSPITAL_COMMUNITY)
Admission: AD | Admit: 2024-04-21 | Discharge: 2024-04-22 | Disposition: A | Discharge status: Home / Self Care | Attending: Obstetrics and Gynecology | Admitting: Obstetrics and Gynecology

## 2024-04-21 ENCOUNTER — Encounter (HOSPITAL_COMMUNITY): Payer: Self-pay | Admitting: Obstetrics and Gynecology

## 2024-04-21 DIAGNOSIS — O209 Hemorrhage in early pregnancy, unspecified: Secondary | ICD-10-CM

## 2024-04-21 DIAGNOSIS — Z3A01 Less than 8 weeks gestation of pregnancy: Secondary | ICD-10-CM | POA: Diagnosis not present

## 2024-04-21 DIAGNOSIS — O039 Complete or unspecified spontaneous abortion without complication: Secondary | ICD-10-CM

## 2024-04-21 LAB — CBC
HCT: 37.1 % (ref 36.0–46.0)
Hemoglobin: 12.3 g/dL (ref 12.0–15.0)
MCH: 31.1 pg (ref 26.0–34.0)
MCHC: 33.2 g/dL (ref 30.0–36.0)
MCV: 93.7 fL (ref 80.0–100.0)
Platelets: 202 10*3/uL (ref 150–400)
RBC: 3.96 MIL/uL (ref 3.87–5.11)
RDW: 12.5 % (ref 11.5–15.5)
WBC: 8.2 10*3/uL (ref 4.0–10.5)
nRBC: 0 % (ref 0.0–0.2)

## 2024-04-21 LAB — WET PREP, GENITAL
Clue Cells Wet Prep HPF POC: NONE SEEN
Sperm: NONE SEEN
Trich, Wet Prep: NONE SEEN
WBC, Wet Prep HPF POC: <10 (ref <10)
Yeast Wet Prep HPF POC: NONE SEEN

## 2024-04-21 LAB — ABO/RH: ABO/RH(D): A POS

## 2024-04-21 LAB — HCG, QUANTITATIVE, PREGNANCY: hCG, Beta Chain, Quant, S: 74 m[IU]/mL — ABNORMAL HIGH (ref <5)

## 2024-04-21 NOTE — MAU Note (Signed)
..  [name] is a 33 y.o. at [redacted]w[redacted]d here in MAU reporting: vaginal bleeding had bright red blood earlier when she wiped. In the lobby went to the bathroom and it was just spotting.  Abdominal cramping immediately after and is gone now.  Patient is tearful in triage because she has had previous miscarriages and is concerned this is another one.   Pain score: 0/10 Vitals:   04/21/24 2225  BP: 139/83  Pulse: 82  Resp: 18  Temp: 97.9 F (36.6 C)  SpO2: 100%

## 2024-04-22 DIAGNOSIS — O209 Hemorrhage in early pregnancy, unspecified: Secondary | ICD-10-CM

## 2024-04-22 DIAGNOSIS — Z3A01 Less than 8 weeks gestation of pregnancy: Secondary | ICD-10-CM

## 2024-04-22 LAB — GC/CHLAMYDIA PROBE AMP (~~LOC~~) NOT AT ARMC
Chlamydia: NEGATIVE
Comment: NEGATIVE
Comment: NORMAL
Neisseria Gonorrhea: NEGATIVE

## 2024-04-22 NOTE — MAU Provider Note (Addendum)
 History     CSN: [id number]  Arrival date and time: 04/21/24 2146   Event Date/Time   First Provider Initiated Contact with Patient 04/22/24 0024      Chief Complaint  Patient presents with   Vaginal Bleeding   Vaginal Bleeding Pertinent negatives include no abdominal pain, back pain, chills, diarrhea, dysuria, fever, flank pain, nausea, rash, sore throat or vomiting.  Karleigh  is 33 y.o. [id number] [redacted]w[redacted]d here with complaints of spotting today She is being followed by OB for serial bchg Reports last was 116. She is concerned about a miscarriage, has had several Reports no cramping or pain Noted spotting at home and then decreased here  Denies, contractions, vaginal discharge.   OB History     Gravida  6   Para  2   Term  2   Preterm      AB  3   Living  2      SAB  2   IAB  1   Ectopic      Multiple  0   Live Births  2           Past Medical History:  Diagnosis Date   Anemia    Asthma    Depression    Generalized anxiety disorder    GERD (gastroesophageal reflux disease)    History of chlamydia    SVD (spontaneous vaginal delivery) 09/06/2019    Past Surgical History:  Procedure Laterality Date   DILATION AND CURETTAGE OF UTERUS      Family History  Problem Relation Age of Onset   Anesthesia problems Mother    Arthritis Mother    Asthma Brother    Cancer Maternal Grandmother        cervix   Seizures Maternal Grandmother    Hypertension Maternal Grandfather    Cancer Maternal Grandfather        prostate   Hypertension Paternal Grandfather    Seizures Maternal Aunt    Anesthesia problems Maternal Aunt    Hypertension Paternal Aunt    Cancer Paternal Aunt        lung   Hypertension Paternal Uncle    Mental retardation Cousin        cp x2   Breast cancer Neg Hx     Social History   Tobacco Use   Smoking status: Every Day    Current packs/day: 0.25    Average packs/day: 0.3 packs/day for 2.0 years (0.5 ttl pk-yrs)    Types:  Cigarettes   Smokeless tobacco: Never  Vaping Use   Vaping status: Never Used  Substance Use Topics   Alcohol use: Yes    Comment: occasionally   Drug use: No    Allergies: No Known Allergies  Medications Prior to Admission  Medication Sig Dispense Refill Last Dose/Taking   albuterol  (VENTOLIN  HFA) 108 (90 Base) MCG/ACT inhaler Inhale 2 puffs into the lungs every 6 (six) hours as needed for wheezing or shortness of breath. 8 g 0    amoxicillin -clavulanate (AUGMENTIN ) 875-125 MG tablet Take 1 tablet by mouth 2 (two) times daily. 20 tablet 0    cetirizine  (ZYRTEC  ALLERGY) 10 MG tablet Take 1 tablet (10 mg total) by mouth at bedtime. 30 tablet 3    fluticasone  (FLONASE ) 50 MCG/ACT nasal spray Place 2 sprays into both nostrils daily. 16 g 6    fluticasone  furoate-vilanterol (BREO ELLIPTA) 100-25 MCG/INH AEPB Inhale 2 puffs into the lungs daily.      medroxyPROGESTERone  (PROVERA ) 10  MG tablet Take 1 tablet (10 mg total) by mouth daily for 10 days. Take to induce menses starting on 1st day of each month. 10 tablet 12    sertraline (ZOLOFT) 50 MG tablet Take by mouth.       Review of Systems  Constitutional:  Negative for chills and fever.  HENT:  Negative for congestion and sore throat.   Eyes:  Negative for pain and visual disturbance.  Respiratory:  Negative for cough, chest tightness and shortness of breath.   Cardiovascular:  Negative for chest pain.  Gastrointestinal:  Negative for abdominal pain, diarrhea, nausea and vomiting.  Endocrine: Negative for cold intolerance and heat intolerance.  Genitourinary:  Positive for vaginal bleeding. Negative for dysuria and flank pain.  Musculoskeletal:  Negative for back pain.  Skin:  Negative for rash.  Allergic/Immunologic: Negative for food allergies.  Neurological:  Negative for dizziness and light-headedness.  Psychiatric/Behavioral:  Negative for agitation.    Physical Exam   Blood pressure 139/83, pulse 82, temperature 97.9 F  (36.6 C), temperature source Oral, resp. rate 18, height 5' 5.5" (1.664 m), weight 93.2 kg, last menstrual period 03/12/2024, SpO2 100%.  Physical Exam Vitals and nursing note reviewed.  Constitutional:      General: She is not in acute distress.    Appearance: She is well-developed.  HENT:     Head: Normocephalic and atraumatic.  Eyes:     General: No scleral icterus.    Conjunctiva/sclera: Conjunctivae normal.  Cardiovascular:     Rate and Rhythm: Normal rate.  Pulmonary:     Effort: Pulmonary effort is normal.  Chest:     Chest wall: No tenderness.  Abdominal:     Palpations: Abdomen is soft.     Tenderness: There is no abdominal tenderness. There is no guarding or rebound.  Genitourinary:    Vagina: Normal.  Musculoskeletal:        General: Normal range of motion.     Cervical back: Normal range of motion and neck supple.  Skin:    General: Skin is warm and dry.     Findings: No rash.  Neurological:     Mental Status: She is alert and oriented to person, place, and time.    Imaging: US  OB LESS THAN 14 WEEKS WITH OB TRANSVAGINAL Result Date: 04/21/2024 CLINICAL DATA:  Vaginal bleeding in early pregnancy EXAM: OBSTETRIC <14 WK US  AND TRANSVAGINAL OB US  TECHNIQUE: Both transabdominal and transvaginal ultrasound examinations were performed for complete evaluation of the gestation as well as the maternal uterus, adnexal regions, and pelvic cul-de-sac. Transvaginal technique was performed to assess early pregnancy. COMPARISON:  None Available. FINDINGS: Intrauterine gestational sac: None Yolk sac:  Not Visualized. Embryo:  Not Visualized. Cardiac Activity: Not Visualized. Heart Rate:   bpm MSD:   mm    w     d CRL:    mm    w    d                  US  EDC: Subchorionic hemorrhage:  None visualized. Maternal uterus/adnexae: No adnexal mass or free fluid. IMPRESSION: No intrauterine pregnancy visualized. Differential considerations would include early intrauterine pregnancy too early to  visualize, spontaneous abortion, or occult ectopic pregnancy. Recommend close clinical followup and serial quantitative beta HCGs and ultrasounds. Electronically Signed   By: Janeece Mechanic M.D.   On: 04/21/2024 23:20    MAU Course  Procedures  MDM- high Vaginal bleeding in pregnancy- r/o ectopic vs miscarriage  vs normal variant  Reviewed labs ordered this encounter which were s/f  - BHCG= 74  Results for orders placed or performed during the hospital encounter of 04/21/24 (from the past 24 hours)  ABO/Rh     Status: None   Collection Time: 04/21/24 10:45 PM  Result Value Ref Range   ABO/RH(D) A POS    No rh immune globuloin      NOT A RH IMMUNE GLOBULIN CANDIDATE, PT RH POSITIVE Performed at [facility], [address] [place], [place] [address]   CBC     Status: None   Collection Time: 04/21/24 10:48 PM  Result Value Ref Range   WBC 8.2 4.0 - 10.5 K/uL   RBC 3.96 3.87 - 5.11 MIL/uL   Hemoglobin 12.3 12.0 - 15.0 g/dL   HCT 60.4 54.0 - 98.1 %   MCV 93.7 80.0 - 100.0 fL   MCH 31.1 26.0 - 34.0 pg   MCHC 33.2 30.0 - 36.0 g/dL   RDW 19.1 47.8 - 29.5 %   Platelets 202 150 - 400 K/uL   nRBC 0.0 0.0 - 0.2 %  hCG, quantitative, pregnancy     Status: Abnormal   Collection Time: 04/21/24 10:48 PM  Result Value Ref Range   hCG, Beta Chain, Quant, S 74 (H) <5 mIU/mL  Wet prep, genital     Status: None   Collection Time: 04/21/24 10:53 PM  Result Value Ref Range   Yeast Wet Prep HPF POC NONE SEEN NONE SEEN   Trich, Wet Prep NONE SEEN NONE SEEN   Clue Cells Wet Prep HPF POC NONE SEEN NONE SEEN   WBC, Wet Prep HPF POC <10 <10   Sperm NONE SEEN      Assessment and Plan   1. Vaginal bleeding affecting early pregnancy   2. [redacted] weeks gestation of pregnancy   3. Miscarriage    - counseled about decreasing BHCG, consistent with possible miscarriage -  repeat bhcg at outpatient office to confirm decreasing pregnancy hormone -  counseled about when to return to MAU  Discharge  home in stable condition  Abner Ables 04/22/2024, 12:24 AM

## 2024-04-22 NOTE — Discharge Instructions (Signed)
 The maternity assessment unit for vaginal bleeding in the setting of early pregnancy.  While you were here you had a complete workup for early pregnancy bleeding.  You had reported to us  that your pregnancy hormone level was 116 at your doctor's office and your pregnancy hormone here today was 74.    We would recommend that you get a repeat at your doctor's office in 1 to 2 days.

## 2024-04-29 ENCOUNTER — Ambulatory Visit: Admitting: Obstetrics and Gynecology

## 2024-05-08 ENCOUNTER — Ambulatory Visit: Payer: Self-pay | Admitting: Obstetrics and Gynecology

## 2024-05-08 ENCOUNTER — Ambulatory Visit: Admitting: Obstetrics and Gynecology

## 2024-05-08 ENCOUNTER — Encounter: Payer: Self-pay | Admitting: Obstetrics and Gynecology

## 2024-05-08 VITALS — BP 118/76 | HR 100 | Temp 98.3°F | Resp 16 | Ht 65.0 in | Wt 209.0 lb

## 2024-05-08 DIAGNOSIS — N921 Excessive and frequent menstruation with irregular cycle: Secondary | ICD-10-CM | POA: Diagnosis not present

## 2024-05-08 DIAGNOSIS — N96 Recurrent pregnancy loss: Secondary | ICD-10-CM

## 2024-05-08 DIAGNOSIS — O034 Incomplete spontaneous abortion without complication: Secondary | ICD-10-CM

## 2024-05-08 DIAGNOSIS — O039 Complete or unspecified spontaneous abortion without complication: Secondary | ICD-10-CM

## 2024-05-08 LAB — CBC
HCT: 39.8 % (ref 35.0–45.0)
Hemoglobin: 13.3 g/dL (ref 11.7–15.5)
MCH: 31.3 pg (ref 27.0–33.0)
MCHC: 33.4 g/dL (ref 32.0–36.0)
MCV: 93.6 fL (ref 80.0–100.0)
MPV: 12 fL (ref 7.5–12.5)
Platelets: 208 10*3/uL (ref 140–400)
RBC: 4.25 10*6/uL (ref 3.80–5.10)
RDW: 12.4 % (ref 11.0–15.0)
WBC: 5.6 10*3/uL (ref 3.8–10.8)

## 2024-05-08 LAB — PREGNANCY, URINE: Preg Test, Ur: POSITIVE — AB

## 2024-05-08 LAB — HCG, QUANTITATIVE, PREGNANCY: HCG, Total, QN: 29 m[IU]/mL

## 2024-05-08 MED ORDER — MISOPROSTOL 200 MCG PO TABS: ORAL_TABLET | ORAL | 0 refills | Status: DC

## 2024-05-08 NOTE — Progress Notes (Signed)
 33 y.o. [id number] female with abnormal Pap smear, migraine with aura, tobacco use here for miscarriage. Married. 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  Presents with her husband.  Patient's last menstrual period was 03/12/2024 (exact date).   04/21/2024-seen in ED for vaginal bleeding.  hCG 74 at the time.  Rh+.  Normal hemoglobin.  TVUS with no IUP identified.  No adnexal mass.  Patient states heavy bleeding following miscarriage 3 weeks ago. Patient complaining of dizziness, fatigue, nauses. Patient states bleeding today is spotting, for ~1 wk. No emesis.  Husband has 4 children outside of current marriage. She has 1 child with husband, oldest is from prior relationship.  Birth control: None Sexually active: Yes  GYN HISTORY: No significant history  OB History  Gravida Para Term Preterm AB Living  6 2 2  4 2   SAB IAB Ectopic Multiple Live Births  3 1  0 2    # Outcome Date GA Lbr Len/2nd Weight Sex Type Anes PTL Lv  6 SAB 04/22/24 [redacted]w[redacted]d         5 Term 09/06/19 [redacted]w[redacted]d / 00:33 7 lb 10.4 oz (3.47 kg) M Vag-Spont EPI  LIV  4 SAB 2019          3 SAB 2019          2 IAB 2015          1 Term 12/26/11 102w3d 14:04 / 00:34 7 lb 5.1 oz (3.32 kg) F Vag-Spont EPI  LIV    Past Medical History:  Diagnosis Date   Anemia    Asthma    Depression    Generalized anxiety disorder    GERD (gastroesophageal reflux disease)    History of chlamydia    SVD (spontaneous vaginal delivery) 09/06/2019    Past Surgical History:  Procedure Laterality Date   DILATION AND CURETTAGE OF UTERUS      Current Outpatient Medications on File Prior to Visit  Medication Sig Dispense Refill   albuterol  (VENTOLIN  HFA) 108 (90 Base) MCG/ACT inhaler Inhale 2 puffs into the lungs every 6 (six) hours as needed for wheezing or shortness of breath. 8 g 0   cetirizine  (ZYRTEC  ALLERGY) 10 MG tablet Take 1 tablet (10 mg total) by mouth at bedtime. 30 tablet 3   fluticasone  (FLONASE ) 50 MCG/ACT nasal spray  Place 2 sprays into both nostrils daily. 16 g 6   fluticasone  furoate-vilanterol (BREO ELLIPTA) 100-25 MCG/INH AEPB Inhale 2 puffs into the lungs daily.     sertraline (ZOLOFT) 50 MG tablet Take by mouth.     No current facility-administered medications on file prior to visit.    No Known Allergies    PE Today's Vitals   05/08/24 0846  BP: 118/76  Pulse: 100  Resp: 16  Temp: 98.3 F (36.8 C)  TempSrc: Oral  Weight: 209 lb (94.8 kg)  Height: 5\' 5"  (1.651 m)   Body mass index is 34.78 kg/m.  Physical Exam Vitals reviewed. Exam conducted with a chaperone present.  Constitutional:      General: She is not in acute distress.    Appearance: Normal appearance.  HENT:     Head: Normocephalic and atraumatic.     Nose: Nose normal.  Eyes:     Extraocular Movements: Extraocular movements intact.     Conjunctiva/sclera: Conjunctivae normal.  Pulmonary:     Effort: Pulmonary effort is normal.  Genitourinary:    General: Normal vulva.     Exam position: Lithotomy  position.     Vagina: Normal. No vaginal discharge.     Cervix: Normal. No cervical motion tenderness, discharge or lesion.     Uterus: Normal. Not enlarged and not tender.      Adnexa: Right adnexa normal and left adnexa normal.     Comments: Light bleeding present Musculoskeletal:        General: Normal range of motion.     Cervical back: Normal range of motion.  Neurological:     General: No focal deficit present.     Mental Status: She is alert.  Psychiatric:        Mood and Affect: Mood normal.        Behavior: Behavior normal.    Assessment and Plan:        Retained products of conception after miscarriage -     CBC -     hCG, quantitative, pregnancy -     miSOPROStol ; Insert 4 tablets by mouth along your cheek. Allow to dissolve.  Dispense: 1 tablet; Refill: 0  Miscarriage -     Pregnancy, urine  Recurrent pregnancy loss -     Antiphospholipid Syndrome Comp   +UPT today. Most likely retained  products following early loss, however consider ectopic as no IUP seen on 04/21/24 TVUS. Check HCG levels today. Patient to await results prior to use of  misoprostol . RTO in 2 wk All questions answered   Romaine Closs, MD

## 2024-05-13 NOTE — Telephone Encounter (Signed)
 Patient called stating that she took cytotec  on Friday for incomplete miscarriage. She states that she is no longer bleeding but is still having some spotting. She wanted to be sure that was normal. Please advise.

## 2024-05-15 ENCOUNTER — Telehealth: Payer: Self-pay

## 2024-05-15 DIAGNOSIS — O034 Incomplete spontaneous abortion without complication: Secondary | ICD-10-CM

## 2024-05-15 NOTE — Telephone Encounter (Signed)
 Patient states that she had to reschedule her June 5th appointment until the 11th She is wanting to know if she can come in and get her HCG labs done before the 11th. Please advise.

## 2024-05-15 NOTE — Telephone Encounter (Signed)
 Yes however labs need to be completed after June 6.  Orders placed.

## 2024-05-20 NOTE — Telephone Encounter (Signed)
 Last result from 05/08/2024 and a future order was placed.  Did she need to keep coming until result <5?

## 2024-05-22 ENCOUNTER — Ambulatory Visit: Admitting: Obstetrics and Gynecology

## 2024-05-27 ENCOUNTER — Telehealth: Payer: Self-pay | Admitting: *Deleted

## 2024-05-27 ENCOUNTER — Telehealth

## 2024-05-27 NOTE — Telephone Encounter (Signed)
 TC from pt, DOB verified.  Pt states last quant was 29 05/08/24.  Took a pregnancy test at home last Friday and it was negative.  She had stopped bleeding.  She had intercourse and then started bleeding again.  I advised it is mostly likely her first period after the miscarriage.  No pain or other complaints with the bleeding.  Pt has a follow up with Dr Andrena Ke tomorrow and she will discuss with her then as well. [name] CMA

## 2024-05-28 ENCOUNTER — Ambulatory Visit: Admitting: Obstetrics and Gynecology

## 2024-05-28 ENCOUNTER — Encounter: Payer: Self-pay | Admitting: Obstetrics and Gynecology

## 2024-05-28 ENCOUNTER — Ambulatory Visit (INDEPENDENT_AMBULATORY_CARE_PROVIDER_SITE_OTHER): Admitting: Obstetrics and Gynecology

## 2024-05-28 VITALS — BP 112/70 | HR 70 | Temp 98.4°F

## 2024-05-28 DIAGNOSIS — O034 Incomplete spontaneous abortion without complication: Secondary | ICD-10-CM

## 2024-05-28 DIAGNOSIS — N96 Recurrent pregnancy loss: Secondary | ICD-10-CM

## 2024-05-28 LAB — PREGNANCY, URINE: Preg Test, Ur: NEGATIVE

## 2024-05-28 NOTE — Telephone Encounter (Signed)
 Call placed to [facility] Customer service for update on 05/08/24 labs Antiphospholipid Syndrome Comp.   Spoke with Amalia Badder. I was advised the specimen was received and is in running in preliminary testing. I was advised there was a delay, [facility] could not confirm reason for the delay, rep said this can occur when they can not defer testing to alternate lab. I was advised standard time for this test is 9 days, rep could not tell me where the testing was in this 9 day time frame. She could confirm it was received and in progress.   Update provided to Dr. Andrena Ke. Dr. Andrena Ke will discuss with patient during OV this morning.   Routing to provider for final review. Patient is agreeable to disposition. Will close encounter.

## 2024-05-28 NOTE — Progress Notes (Signed)
 33 y.o. [id number] female with incomplete miscarriage, abnormal Pap smear, migraine with aura, tobacco use here for miscarriage follow-up. 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  Presents with her husband.  Patient's last menstrual period was 05/27/2024 (exact date).   04/21/2024-seen in ED for vaginal bleeding.  hCG 74 at the time.  Rh+.  Normal hemoglobin.  TVUS with no IUP identified.  No adnexal mass.  Patient states heavy bleeding following miscarriage 3 weeks ago. Patient complaining of dizziness, fatigue, nauses. Patient states bleeding today is spotting, for ~1 wk. No emesis.  Husband has 4 children outside of current marriage. She has 1 child with husband, oldest is from prior relationship.  Treated with misoprostol  on 05/08/24 Today, she reports bleeding after misoprostol  for 2 wk. Stopped 4d and restarted yesterday. Now like a period. Neg UPT at home. No pain today.Having blood clots, quarter size.  Birth control: None Sexually active: Yes  GYN HISTORY: No significant history  OB History  Gravida Para Term Preterm AB Living  6 2 2  4 2   SAB IAB Ectopic Multiple Live Births  3 1  0 2    # Outcome Date GA Lbr Len/2nd Weight Sex Type Anes PTL Lv  6 SAB 04/22/24 [redacted]w[redacted]d         5 Term 09/06/19 [redacted]w[redacted]d / 00:33 7 lb 10.4 oz (3.47 kg) M Vag-Spont EPI  LIV  4 SAB 2019          3 SAB 2019          2 IAB 2015          1 Term 12/26/11 [redacted]w[redacted]d 14:04 / 00:34 7 lb 5.1 oz (3.32 kg) F Vag-Spont EPI  LIV    Past Medical History:  Diagnosis Date   Anemia    Asthma    Depression    Generalized anxiety disorder    GERD (gastroesophageal reflux disease)    History of chlamydia    SVD (spontaneous vaginal delivery) 09/06/2019    Past Surgical History:  Procedure Laterality Date   DILATION AND CURETTAGE OF UTERUS      Current Outpatient Medications on File Prior to Visit  Medication Sig Dispense Refill   albuterol  (VENTOLIN  HFA) 108 (90 Base) MCG/ACT inhaler Inhale 2  puffs into the lungs every 6 (six) hours as needed for wheezing or shortness of breath. 8 g 0   cetirizine  (ZYRTEC  ALLERGY) 10 MG tablet Take 1 tablet (10 mg total) by mouth at bedtime. 30 tablet 3   fluticasone  (FLONASE ) 50 MCG/ACT nasal spray Place 2 sprays into both nostrils daily. 16 g 6   sertraline (ZOLOFT) 50 MG tablet Take by mouth.     No current facility-administered medications on file prior to visit.    No Known Allergies    PE Today's Vitals   05/28/24 1157  BP: 112/70  Pulse: 70  Temp: 98.4 F (36.9 C)  TempSrc: Oral  SpO2: 99%   There is no height or weight on file to calculate BMI.  Physical Exam Vitals reviewed.  Constitutional:      General: She is not in acute distress.    Appearance: Normal appearance.  HENT:     Head: Normocephalic and atraumatic.     Nose: Nose normal.  Eyes:     Extraocular Movements: Extraocular movements intact.     Conjunctiva/sclera: Conjunctivae normal.  Pulmonary:     Effort: Pulmonary effort is normal.  Musculoskeletal:  General: Normal range of motion.     Cervical back: Normal range of motion.  Neurological:     General: No focal deficit present.     Mental Status: She is alert.  Psychiatric:        Mood and Affect: Mood normal.        Behavior: Behavior normal.    Assessment and Plan:        Incomplete miscarriage -     Pregnancy, urine  Negative UPT today, resolved Cycle started yesterday Okay to try to conceive after this cycle RTO for labs in 1 wk if APLS test has not resulted due to delay in lab processing   Romaine Closs, MD

## 2024-05-28 NOTE — Patient Instructions (Addendum)
 Start daily prenatal vitamins. Call office to schedule lab appointment in 1 week if antiphospholipid test has not resulted du.

## 2024-06-01 ENCOUNTER — Ambulatory Visit: Payer: Self-pay | Admitting: Obstetrics and Gynecology

## 2024-06-03 ENCOUNTER — Encounter (INDEPENDENT_AMBULATORY_CARE_PROVIDER_SITE_OTHER): Payer: Self-pay

## 2024-06-03 ENCOUNTER — Institutional Professional Consult (permissible substitution) (INDEPENDENT_AMBULATORY_CARE_PROVIDER_SITE_OTHER): Admitting: Physician Assistant

## 2024-06-03 NOTE — Progress Notes (Deleted)
 Office: [phone]  /  Fax: [phone]   Initial Visit    KATELAND LEISINGER was seen in clinic today to evaluate for obesity. She is interested in losing weight to improve overall health and reduce the risk of weight related complications. She presents today to review program treatment options, initial physical assessment, and evaluation.      She was referred by: {emreferby:28303}  When asked what else they would like to accomplish? She states: {EMHopetoaccomplish:28304}  When asked how has your weight affected you? She states: {EMWeightAffected:28305}  Weight history: ***  Highest weight: ***  Some associated conditions: {EMSomeConditions:28306}  Contributing factors: {EMcontributingfactors:28307}  Weight promoting medications identified: {EMWeightpromotingrx:28308}  Prior weight loss attempts: {emweightlossprograms:31590::None}  Current nutrition plan: {EMNutritionplan:28309::None}  Current level of physical activity: {EMcurrentPA:28310::None}  Current or previous pharmacotherapy: {EM previousRx:28311}  Response to medication: {EMResponsetomedication:28312}   Past medical history includes:   Past Medical History:  Diagnosis Date   Anemia    Asthma    Depression    Generalized anxiety disorder    GERD (gastroesophageal reflux disease)    History of chlamydia    SVD (spontaneous vaginal delivery) 09/06/2019     Objective    LMP 05/27/2024 (Exact Date)  She was weighed on the bioimpedance scale: There is no height or weight on file to calculate BMI.  Body Fat%:***, Visceral Fat Rating:***, Weight trend over the last 12 months: {emweighttrend:28333}  General:  Alert, oriented and cooperative. Patient is in no acute distress.  Respiratory: Normal respiratory effort, no problems with respiration noted   Gait: able to ambulate independently  Mental Status: Normal mood and affect. Normal behavior. Normal judgment and thought content.   DIAGNOSTIC DATA  REVIEWED:  BMET    Component Value Date/Time   NA 139 08/17/2022 1839   K 3.2 (L) 08/17/2022 1839   CL 108 08/17/2022 1839   CO2 19 (L) 08/17/2022 1839   GLUCOSE 98 08/17/2022 1839   BUN 9 08/17/2022 1839   CREATININE 0.80 08/17/2022 1839   CALCIUM 9.4 08/17/2022 1839   GFRNONAA >60 08/17/2022 1839   GFRAA >60 03/30/2017 0915   No results found for: HGBA1C No results found for: INSULIN CBC    Component Value Date/Time   WBC 5.6 05/08/2024 0946   RBC 4.25 05/08/2024 0946   HGB 13.3 05/08/2024 0946   HCT 39.8 05/08/2024 0946   PLT 208 05/08/2024 0946   MCV 93.6 05/08/2024 0946   MCH 31.3 05/08/2024 0946   MCHC 33.4 05/08/2024 0946   RDW 12.4 05/08/2024 0946   Iron/TIBC/Ferritin/ %Sat No results found for: IRON, TIBC, FERRITIN, IRONPCTSAT Lipid Panel  No results found for: CHOL, TRIG, HDL, CHOLHDL, VLDL, LDLCALC, LDLDIRECT Hepatic Function Panel     Component Value Date/Time   PROT 7.4 08/17/2022 1839   ALBUMIN 4.6 08/17/2022 1839   AST 11 (L) 08/17/2022 1839   ALT 7 08/17/2022 1839   ALKPHOS 36 (L) 08/17/2022 1839   BILITOT 0.5 08/17/2022 1839      Component Value Date/Time   TSH 1.07 01/01/2024 1549     Assessment and Plan   Bilateral hip pain  Bilateral sciatica  Generalized anxiety disorder    Assessment and Plan Assessment & Plan         Obesity Treatment / Action Plan:  {EMobesityactionplanscribe:28314::Patient will work on garnering support from family and friends to begin weight loss journey.,Will work on eliminating or reducing the presence of highly palatable, calorie dense foods in the home.,Will complete provided nutritional and  psychosocial assessment questionnaire before the next appointment.,Will be scheduled for indirect calorimetry to determine resting energy expenditure in a fasting state.  This will allow us  to create a reduced calorie, high-protein meal plan to promote loss of fat mass while  preserving muscle mass.,Counseled on the health benefits of losing 5%-15% of total body weight.,Was counseled on nutritional approaches to weight loss and benefits of reducing processed foods and consuming plant-based foods and high quality protein as part of nutritional weight management.,Was counseled on pharmacotherapy and role as an adjunct in weight management. }  Obesity Education Performed Today:  She was weighed on the bioimpedance scale and results were discussed and documented in the synopsis.  We discussed obesity as a disease and the importance of a more detailed evaluation of all the factors contributing to the disease.  We discussed the importance of long term lifestyle changes which include nutrition, exercise and behavioral modifications as well as the importance of customizing this to her specific health and social needs.  We discussed the benefits of reaching a healthier weight to alleviate the symptoms of existing conditions and reduce the risks of the biomechanical, metabolic and psychological effects of obesity.  We reviewed the four pillars of obesity medicine and importance of using a multimodal approach.  We reviewed the basic principles in weight management.   Serenitee R Gerhold appears to be in the action stage of change and states they are ready to start intensive lifestyle modifications and behavioral modifications.  I have spent *** minutes in the care of the patient today including: {NUMBER 1-10:22536} minutes before the visit reviewing and preparing the chart. *** minutes face-to-face {emfacetoface:32598::assessing and reviewing listed medical problems as outlined in obesity care plan,providing nutritional and behavioral counseling on topics outlined in the obesity care plan,independently interpreting test results and goals of care, as described in assessment and plan,reviewing and discussing biometric information and progress} {NUMBER 1-10:22536}  minutes after the visit updating chart and documentation of encounter.  Reviewed by clinician on day of visit: allergies, medications, problem list, medical history, surgical history, family history, social history, and previous encounter notes pertinent to obesity diagnosis.   Bethanie Bloxom,PA-C

## 2024-06-06 ENCOUNTER — Encounter: Payer: Self-pay | Admitting: Obstetrics and Gynecology

## 2024-06-06 LAB — ANTIPHOSPHOLIPID SYNDROME COMP
APTT: 30.8 s — ABNORMAL HIGH
Anticardiolipin Ab, IgA: <10 [APL'U]
Anticardiolipin Ab, IgG: <10 [GPL'U]
Anticardiolipin Ab, IgM: <10 [MPL'U]
Antiphosphatidylserine IgG: 1 {GPS'U}
Antiphosphatidylserine IgM: 4 {MPS'U}
Antiprothrombin Antibody, IgG: 4 G units
Beta-2 Glycoprotein I, IgA: <10 SAU
Beta-2 Glycoprotein I, IgG: <10 SGU
Beta-2 Glycoprotein I, IgM: <10 SMU
DRVVT Screen Seconds: 44 s
Hexagonal Phospholipid Neutral: 18 s — ABNORMAL HIGH
Platelet Neutralization: 1.3 s

## 2024-06-06 NOTE — Telephone Encounter (Signed)
 Patient left message at 1520 requesting call back regarding results.   Spoke with patient, reports bleeding has stopped.   05/08/24 Antiphospholipid Syndrome labs are resulted, patient request to review.   Advised Dr. Andrena Ke is out of the office this afternoon, will forward for her to review when she returns on 06/09/24. Patient agreeable.

## 2024-06-09 NOTE — Telephone Encounter (Signed)
 Spoke with patient, advised messages received. Patient is very anxious about results, wants any provider to review. Advised patient that message has been sent to Dr. Dallie, she is seeing patients and our office will respond with results via MyChart or return call once reviewed. Patient appreciative of call.

## 2024-06-09 NOTE — Telephone Encounter (Signed)
-----   Message from [place] sent at 06/09/2024 10:32 AM EDT ----- Walterine I have this pt on the phone she has been calling since this morning. I transferred her to the triage line about an hour ago and she just called again. She is very worried about her lab results if someone can get into contact with her that be great. I  transferred her over to the triage line again.

## 2024-06-09 NOTE — Addendum Note (Signed)
 Addended by: DALLIE VERA GAILS on: 06/09/2024 03:50 PM   Modules accepted: Orders

## 2024-06-17 ENCOUNTER — Institutional Professional Consult (permissible substitution) (INDEPENDENT_AMBULATORY_CARE_PROVIDER_SITE_OTHER): Admitting: Adult Health

## 2024-07-21 ENCOUNTER — Telehealth: Payer: Self-pay | Admitting: *Deleted

## 2024-07-21 DIAGNOSIS — Z3201 Encounter for pregnancy test, result positive: Secondary | ICD-10-CM

## 2024-07-21 NOTE — Telephone Encounter (Signed)
 Spoke with patient, advised as seen below per Dr. Dallie. Lab appt scheduled for 07/22/24 at 0900. Order placed. Patient verbalizes understanding and is agreeable.   Encounter closed.

## 2024-07-21 NOTE — Telephone Encounter (Signed)
 Spoke with patient, positive UPT this morning. LMP 06/24/24. Reports nausea and pain with urination. Was seen by urgent care 8/3, started on cephalexin  for UTI. Patient states no UPT done 8/3. Took dose of abx this morning.   Denies flank pain, fever/chills, vomiting, bleeding.   Advised I will review with Dr. Dallie and f/u with recommendations, patient agreeable.   Hx of miscarriage   Dr. Dallie -please review and advise.

## 2024-07-22 ENCOUNTER — Other Ambulatory Visit

## 2024-07-22 ENCOUNTER — Encounter: Payer: Self-pay | Admitting: Obstetrics and Gynecology

## 2024-07-22 DIAGNOSIS — Z3201 Encounter for pregnancy test, result positive: Secondary | ICD-10-CM

## 2024-07-22 LAB — PREGNANCY, URINE: Preg Test, Ur: NEGATIVE

## 2024-07-22 NOTE — Telephone Encounter (Signed)
 Patient called x1 requesting results from UPT today.   Call returned to patient, advised UPT in office negative. Advised still needs to be reviewed by Dr. Dallie. Patient states UPT at home this morning also negative.   Advised I will update Dr. Dallie and f/u once reviewed, patient agreeable.   Routing to Dr. Dallie to review

## 2024-07-23 ENCOUNTER — Ambulatory Visit: Payer: Self-pay | Admitting: Obstetrics and Gynecology

## 2024-07-23 NOTE — Telephone Encounter (Signed)
 Call placed to patient, left detailed message, ok per dpr.  Advised per Dr. Dallie. Return call to office at [phone], option 4,  if any additional questions.

## 2024-07-23 NOTE — Progress Notes (Signed)
 Subjective   HPI:  [name] is a 33 y.o.  female who presents to the office with:  Chief Complaint  Patient presents with  . Amenorrhea    07/22/24 pt took 3 pregnancy test at home and 3 positive tests. Last 06/24/24   LMP July 8.  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  Last miscarriage require misoprostol  on 05/08/24.  Neg UPT 6/11.  ROS: Negative for constitutional, CV, Resp, Abd/GU symptoms except as noted above in HPI.  PMH: Past medical history, Past surgical history, Social history, family history were reviewed as noted in EMR.  Pertinent for:  Past Medical History:  Diagnosis Date  . Allergy Childhood   Seasonal allergies  . Anxiety 2009   Diagnosed with General Anxiety Disorder  . Asthma (*) Childhood   Seasonal allergies triggered  . Bilateral sciatica 10/17/2022     Medications and allergies reviewed.   Objective    Physical Exam:  Vital Signs: BP 110/80   Pulse 69   Temp 97.4 F (36.3 C) (Temporal)   Resp 16   Ht 5' 5.5 (1.664 m)   Wt 204 lb 12.8 oz (92.9 kg)   LMP 06/24/2024 (Exact Date)   SpO2 99%   Breastfeeding No   BMI 33.56 kg/m   General:  Well appearing, Alert & Oriented CV:  Normal to palpation without thrill.  Regular Rate and Rhythm, No murmurs, rubs, or gallops.   Respiratory:  Normal respiratory effort.  No wheezes or crackles.  Good air movement without diminished sounds.   Abdomen:  Normal bowel sounds.  Non-tender to palpation.  No rebound or guarding.  No palpable liver or spleen. Extremities:  No pretibial edema  Assessment/Plan    Orders Placed This Encounter  Procedures  . hCG, Quantitative  . POCT Pregnancy, Urine    Amenorrhea  Will send serum HCG- very early pregnancy liklely given home positive tests. If positive- rec to call to let them know and follow-up with her OBGYN  She will continue to take PNV and work on smoking cessation   Medications at end of visit today: Current  Medications[1]        [1]  Current Outpatient Medications:  .  benzonatate  (TESSALON  PERLES) 100 mg capsule, Take one capsule (100 mg dose) by mouth 3 (three) times a day as needed for Cough., Disp: 30 capsule, Rfl: 0 .  cetirizine  (ZYRTEC ) 10 mg tablet, Take one tablet (10 mg dose) by mouth daily., Disp: 30 tablet, Rfl: 2 .  diclofenac sodium (VOLTAREN) 50 mg EC tablet, 1 tab by mouth twice a day as needed for headaches.  Do not exceed greater than 3 days worth of treatment, Disp: 30 tablet, Rfl: 3 .  fluticasone  propionate (FLONASE ) 50 mcg/actuation nasal spray, one spray by Nasal route daily., Disp: 16 g, Rfl: 0 .  hydrOXYzine  pamoate (VISTARIL ) 25 mg capsule, Take one capsule (25 mg dose) by mouth at bedtime as needed for Itching. Caution can cause drowsiness, Disp: 30 capsule, Rfl: 1 .  propranolol HCl (INDERAL) 10 mg tablet, Take one tablet (10 mg dose) by mouth 2 (two) times daily., Disp: 60 tablet, Rfl: 3 .  sertraline (ZOLOFT) 50 mg tablet, Take one tablet (50 mg dose) by mouth daily., Disp: 90 tablet, Rfl: 1

## 2024-07-25 NOTE — Progress Notes (Signed)
 Subjective   HPI:  [name] is a 33 y.o.  female who presents to the office with:  Chief Complaint  Patient presents with  . Vaginitis    Presents today with concerns of a vaginal irritation.  Notes that it is superior to her vaginal introitus.  It is external only.  She does report some chronic white vaginal discharge that is not thick or malodorous - this is not new for patient.  She has not had any urinary symptoms more recently.  She was recently told that she was pregnant.  Serum hCG resulted at 40 on 07/23/2024.  History of multiple miscarriages. Denies any internal pelivc pain, bleeding. LUTS. No fevers, systemic symptoms.    PMH: Past medical history, Past surgical history, Social history, family history were reviewed as noted in EMR.  Pertinent for:  Past Medical History:  Diagnosis Date  . Allergy Childhood   Seasonal allergies  . Anxiety 2009   Diagnosed with General Anxiety Disorder  . Asthma (*) Childhood   Seasonal allergies triggered  . Bilateral sciatica 10/17/2022     Medications and allergies reviewed.  ROS: All systems were negative including constitutional, CV, respiratory, GI/GU except for those noted in the HPI.  Objective   Vital Signs: BP 107/72   Pulse 79   Temp 97.5 F (36.4 C) (Temporal)   Resp 16   Ht 5' 5.5 (1.664 m)   Wt 207 lb 3.2 oz (94 kg)   LMP 06/24/2024 (Exact Date)   SpO2 99%   Breastfeeding No   BMI 33.96 kg/m   Wt Readings from Last 3 Encounters:  07/25/24 207 lb 3.2 oz (94 kg)  07/23/24 204 lb 12.8 oz (92.9 kg)  07/15/24 206 lb 3.2 oz (93.5 kg)   Physical Exam Vitals and nursing note reviewed.  Constitutional:      General: She is not in acute distress.    Appearance: She is not toxic-appearing.  HENT:     Head: Normocephalic and atraumatic.  Eyes:     Conjunctiva/sclera: Conjunctivae normal.  Pulmonary:     Effort: Pulmonary effort is normal. No respiratory distress.  Genitourinary:    Labia:        Left:  Injury (small abrasion. no ulceration noted. TTP overlying. no surrounding erythema.) present.   Neurological:     Mental Status: She is alert and oriented to person, place, and time.  Psychiatric:        Mood and Affect: Mood and affect normal.        Cognition and Memory: Memory normal.        Judgment: Judgment normal.    Daphne Agent, RMA, chaperoned exam  Assessment/Plan   No orders of the defined types were placed in this encounter.   1. Vaginal irritation    External only.  Small abrasion is noted on right superior external labia.  Recommended to use protective barrier.  Clotrimazole provided though area does not appear to be slight.  Patient will continue to monitor closely and follow-up if any symptoms change or worsen.  No problem-specific Assessment & Plan notes found for this encounter.   No follow-ups on file.  No future appointments.  Medications at end of visit today: Current Medications[1]  Patient Care Team: Chianne Kline, PA-C as PCP - General (Family Medicine) Inocente JONETTA Ruddle, MD (Neurology)         [1]  Current Outpatient Medications:  .  benzonatate  (TESSALON  PERLES) 100 mg capsule, Take one capsule (100 mg dose)  by mouth 3 (three) times a day as needed for Cough., Disp: 30 capsule, Rfl: 0 .  cetirizine  (ZYRTEC ) 10 mg tablet, Take one tablet (10 mg dose) by mouth daily., Disp: 30 tablet, Rfl: 2 .  clotrimazole (LOTRIMIN) 1% cream, Apply to affected area 2 times daily, Disp: 30 g, Rfl: 1 .  diclofenac sodium (VOLTAREN) 50 mg EC tablet, 1 tab by mouth twice a day as needed for headaches.  Do not exceed greater than 3 days worth of treatment, Disp: 30 tablet, Rfl: 3 .  fluticasone  propionate (FLONASE ) 50 mcg/actuation nasal spray, one spray by Nasal route daily., Disp: 16 g, Rfl: 0 .  hydrOXYzine  pamoate (VISTARIL ) 25 mg capsule, Take one capsule (25 mg dose) by mouth at bedtime as needed for Itching. Caution can cause drowsiness, Disp: 30 capsule,  Rfl: 1 .  propranolol HCl (INDERAL) 10 mg tablet, Take one tablet (10 mg dose) by mouth 2 (two) times daily., Disp: 60 tablet, Rfl: 3 .  sertraline (ZOLOFT) 50 mg tablet, Take one tablet (50 mg dose) by mouth daily., Disp: 90 tablet, Rfl: 1

## 2024-08-08 ENCOUNTER — Inpatient Hospital Stay (HOSPITAL_COMMUNITY)

## 2024-08-08 ENCOUNTER — Other Ambulatory Visit

## 2024-08-08 ENCOUNTER — Encounter (HOSPITAL_COMMUNITY): Payer: Self-pay | Admitting: Obstetrics and Gynecology

## 2024-08-08 ENCOUNTER — Inpatient Hospital Stay (HOSPITAL_COMMUNITY)
Admission: AD | Admit: 2024-08-08 | Discharge: 2024-08-09 | Disposition: A | Discharge status: Home / Self Care | Attending: Obstetrics and Gynecology | Admitting: Obstetrics and Gynecology

## 2024-08-08 DIAGNOSIS — O00102 Left tubal pregnancy without intrauterine pregnancy: Secondary | ICD-10-CM | POA: Diagnosis not present

## 2024-08-08 DIAGNOSIS — F32A Depression, unspecified: Secondary | ICD-10-CM | POA: Diagnosis present

## 2024-08-08 DIAGNOSIS — O99341 Other mental disorders complicating pregnancy, first trimester: Secondary | ICD-10-CM | POA: Insufficient documentation

## 2024-08-08 DIAGNOSIS — Z3A01 Less than 8 weeks gestation of pregnancy: Secondary | ICD-10-CM | POA: Diagnosis not present

## 2024-08-08 DIAGNOSIS — F17210 Nicotine dependence, cigarettes, uncomplicated: Secondary | ICD-10-CM | POA: Insufficient documentation

## 2024-08-08 DIAGNOSIS — O99331 Smoking (tobacco) complicating pregnancy, first trimester: Secondary | ICD-10-CM | POA: Insufficient documentation

## 2024-08-08 LAB — HCG, QUANTITATIVE, PREGNANCY: hCG, Beta Chain, Quant, S: 943 m[IU]/mL — ABNORMAL HIGH (ref <5)

## 2024-08-08 LAB — COMPREHENSIVE METABOLIC PANEL WITH GFR
ALT: 16 U/L (ref 0–44)
AST: 17 U/L (ref 15–41)
Albumin: 3.9 g/dL (ref 3.5–5.0)
Alkaline Phosphatase: 35 U/L — ABNORMAL LOW (ref 38–126)
Anion gap: 7 (ref 5–15)
BUN: 7 mg/dL (ref 6–20)
CO2: 23 mmol/L (ref 22–32)
Calcium: 9.2 mg/dL (ref 8.9–10.3)
Chloride: 109 mmol/L (ref 98–111)
Creatinine, Ser: 0.79 mg/dL (ref 0.44–1.00)
GFR, Estimated: >60 mL/min (ref >60)
Glucose, Bld: 100 mg/dL — ABNORMAL HIGH (ref 70–99)
Potassium: 3.8 mmol/L (ref 3.5–5.1)
Sodium: 139 mmol/L (ref 135–145)
Total Bilirubin: 0.4 mg/dL (ref 0.0–1.2)
Total Protein: 6.7 g/dL (ref 6.5–8.1)

## 2024-08-08 LAB — CBC
HCT: 36.9 % (ref 36.0–46.0)
Hemoglobin: 12.2 g/dL (ref 12.0–15.0)
MCH: 30.8 pg (ref 26.0–34.0)
MCHC: 33.1 g/dL (ref 30.0–36.0)
MCV: 93.2 fL (ref 80.0–100.0)
Platelets: 220 K/uL (ref 150–400)
RBC: 3.96 MIL/uL (ref 3.87–5.11)
RDW: 12.8 % (ref 11.5–15.5)
WBC: 9.6 K/uL (ref 4.0–10.5)
nRBC: 0 % (ref 0.0–0.2)

## 2024-08-08 LAB — ABO/RH: ABO/RH(D): A POS

## 2024-08-08 NOTE — MAU Note (Signed)
 Pt says  [facility]- [facility]-  First HCG level-  41 on 8-3                            124 on 8-8                              526 on 8-11                              363 on 8-14 - the called pt and told her she was going to have a SAB. ( Has had spotting when she wipes - no cramps )  Then went to office today - HCG level - 741. Pt called on- line nurse - told to come in .

## 2024-08-09 DIAGNOSIS — O00102 Left tubal pregnancy without intrauterine pregnancy: Secondary | ICD-10-CM

## 2024-08-09 DIAGNOSIS — Z3A01 Less than 8 weeks gestation of pregnancy: Secondary | ICD-10-CM

## 2024-08-09 MED ORDER — ONDANSETRON 4 MG PO TBDP: 4.0000 mg | ORAL_TABLET | Freq: Three times a day (TID) | ORAL | 0 refills | Status: AC | PRN

## 2024-08-09 MED ORDER — METHOTREXATE FOR ECTOPIC PREGNANCY
50.0000 mg/m2 | Freq: Once | INTRAMUSCULAR | Status: AC
  Administered 2024-08-09: 105 mg via INTRAMUSCULAR
  Filled 2024-08-09: qty 4.2

## 2024-08-09 MED ORDER — ACETAMINOPHEN 500 MG PO TABS: 500.0000 mg | ORAL_TABLET | Freq: Four times a day (QID) | ORAL | 0 refills | Status: AC | PRN

## 2024-08-09 MED ORDER — OXYCODONE HCL 5 MG PO TABS: 5.0000 mg | ORAL_TABLET | ORAL | 0 refills | Status: DC | PRN

## 2024-08-09 NOTE — Discharge Instructions (Addendum)
 Methotrexate  can cause stomach upset (zofran  is prescribed), please avoid direct sunlight and abstain from alcohol, NSAIDs and sexual intercourse for two weeks. STOP taking any prenatal vitamin with folic acid in it. Please follow up on D4 (Tuesday 8/26) and D7 (Friday 8/29) for repeat BHCG. Please return with any severe left sided abdominal pain, vomiting, or fainting. Day 0/1 Day 4 Day 7  Sunday Wednesday Saturday  Monday Thursday Sunday  Tuesday Friday Monday  Wednesday Saturday Tuesday  Thursday Sunday Wednesday  Friday Monday Thursday  Saturday Tuesday Friday   Appointments say 4pm, but you can come anytime on Tuesday or Friday.

## 2024-08-09 NOTE — MAU Provider Note (Signed)
 Chief Complaint:  labs reviewed   HPI  [name] is a 33 y.o. H2E7957 at [redacted]w[redacted]d who presents to maternity admissions reporting needing her labs reviewed since her OB [facility] [place]) has been trending her quantitative HCG and it went up instead of down. She was counseled that she was likely having a miscarriage, but then her quant went up so she called the nurse line and was advised to come here. Denies cramping, did have spotting a few days ago but none today. No other physical complaints.   Pregnancy Course: Has not had imaging with [facility] but quants have been as follows (per pt's [facility] app) -  8/3 - 41 8/8 - 124 8/11 - 526 8/14 - 363 8/22 - 741  Past Medical History:  Diagnosis Date   Anemia    Asthma    Depression    Generalized anxiety disorder    GERD (gastroesophageal reflux disease)    History of chlamydia    SVD (spontaneous vaginal delivery) 09/06/2019   OB History  Gravida Para Term Preterm AB Living  7 2 2  4 2   SAB IAB Ectopic Multiple Live Births  3 1  0 2    # Outcome Date GA Lbr Len/2nd Weight Sex Type Anes PTL Lv  7 Current           6 SAB 04/22/24 [redacted]w[redacted]d         5 Term 09/06/19 [redacted]w[redacted]d / 00:33 7 lb 10.4 oz (3.47 kg) M Vag-Spont EPI  LIV  4 SAB 2019          3 SAB 2019          2 IAB 2015          1 Term 12/26/11 [redacted]w[redacted]d 14:04 / 00:34 7 lb 5.1 oz (3.32 kg) F Vag-Spont EPI  LIV   Past Surgical History:  Procedure Laterality Date   DILATION AND CURETTAGE OF UTERUS     Family History  Problem Relation Age of Onset   Anesthesia problems Mother    Arthritis Mother    Asthma Brother    Cancer Maternal Grandmother        cervix   Seizures Maternal Grandmother    Hypertension Maternal Grandfather    Cancer Maternal Grandfather        prostate   Hypertension Paternal Grandfather    Seizures Maternal Aunt    Anesthesia problems Maternal Aunt    Hypertension Paternal Aunt    Cancer Paternal Aunt        lung   Hypertension Paternal Uncle     Mental retardation Cousin        cp x2   Breast cancer Neg Hx    Social History   Tobacco Use   Smoking status: Every Day    Current packs/day: 0.25    Average packs/day: 0.3 packs/day for 2.0 years (0.5 ttl pk-yrs)    Types: Cigarettes   Smokeless tobacco: Never  Vaping Use   Vaping status: Never Used  Substance Use Topics   Alcohol use: Yes    Comment: occasionally   Drug use: No   No Known Allergies Medications Prior to Admission  Medication Sig Dispense Refill Last Dose/Taking   albuterol  (VENTOLIN  HFA) 108 (90 Base) MCG/ACT inhaler Inhale 2 puffs into the lungs every 6 (six) hours as needed for wheezing or shortness of breath. 8 g 0    cetirizine  (ZYRTEC  ALLERGY) 10 MG tablet Take 1 tablet (10 mg total) by mouth  at bedtime. 30 tablet 3    fluticasone  (FLONASE ) 50 MCG/ACT nasal spray Place 2 sprays into both nostrils daily. 16 g 6    sertraline (ZOLOFT) 50 MG tablet Take by mouth.      I have reviewed patient's Past Medical Hx, Surgical Hx, Family Hx, Social Hx, medications and allergies.   ROS  Pertinent items noted in HPI and remainder of comprehensive ROS otherwise negative.   PHYSICAL EXAM  Patient Vitals for the past 24 hrs:  BP Temp Temp src Pulse Resp Height Weight  08/08/24 2145 128/84 98.5 F (36.9 C) Oral 74 12 5' 5 (1.651 m) 209 lb (94.8 kg)   Constitutional: Well-developed, well-nourished female in no acute distress.  Cardiovascular: normal rate & rhythm, warm and well-perfused Respiratory: normal effort, no problems with respiration noted GI: Abd soft, non-tender, non-distended MS: Extremities nontender, no edema, normal ROM Neurologic: Alert and oriented x 4.  GU: no CVA tenderness Pelvic: exam deferred, sent to U/S   Labs: Results for orders placed or performed during the hospital encounter of 08/08/24 (from the past 24 hours)  ABO/Rh     Status: None   Collection Time: 08/08/24 10:12 PM  Result Value Ref Range   ABO/RH(D) A POS    No rh immune  globuloin      NOT A RH IMMUNE GLOBULIN CANDIDATE, PT RH POSITIVE Performed at [facility], [address], [place], [place] [address]   CBC     Status: None   Collection Time: 08/08/24 10:14 PM  Result Value Ref Range   WBC 9.6 4.0 - 10.5 K/uL   RBC 3.96 3.87 - 5.11 MIL/uL   Hemoglobin 12.2 12.0 - 15.0 g/dL   HCT 63.0 63.9 - 53.9 %   MCV 93.2 80.0 - 100.0 fL   MCH 30.8 26.0 - 34.0 pg   MCHC 33.1 30.0 - 36.0 g/dL   RDW 87.1 88.4 - 84.4 %   Platelets 220 150 - 400 K/uL   nRBC 0.0 0.0 - 0.2 %  hCG, quantitative, pregnancy     Status: Abnormal   Collection Time: 08/08/24 10:14 PM  Result Value Ref Range   hCG, Beta Chain, Quant, S 943 (H) <5 mIU/mL  Comprehensive metabolic panel     Status: Abnormal   Collection Time: 08/08/24 10:14 PM  Result Value Ref Range   Sodium 139 135 - 145 mmol/L   Potassium 3.8 3.5 - 5.1 mmol/L   Chloride 109 98 - 111 mmol/L   CO2 23 22 - 32 mmol/L   Glucose, Bld 100 (H) 70 - 99 mg/dL   BUN 7 6 - 20 mg/dL   Creatinine, Ser 9.20 0.44 - 1.00 mg/dL   Calcium 9.2 8.9 - 89.6 mg/dL   Total Protein 6.7 6.5 - 8.1 g/dL   Albumin 3.9 3.5 - 5.0 g/dL   AST 17 15 - 41 U/L   ALT 16 0 - 44 U/L   Alkaline Phosphatase 35 (L) 38 - 126 U/L   Total Bilirubin 0.4 0.0 - 1.2 mg/dL   GFR, Estimated >39 >39 mL/min   Anion gap 7 5 - 15   Imaging:  US  OB LESS THAN 14 WEEKS WITH OB TRANSVAGINAL Result Date: 08/08/2024 CLINICAL DATA:  Vaginal bleeding during early pregnancy. Estimated gestational age by LMP is 6 weeks 3 days. Quantitative beta HCG is 741 today. Previous levels have waxed and waned between August 3rd and August 22nd. EXAM: OBSTETRIC <14 WK US  AND TRANSVAGINAL OB US  TECHNIQUE:  Both transabdominal and transvaginal ultrasound examinations were performed for complete evaluation of the gestation as well as the maternal uterus, adnexal regions, and pelvic cul-de-sac. Transvaginal technique was performed to assess early pregnancy. COMPARISON:  None Available.  FINDINGS: Intrauterine gestational sac: None. Yolk sac:  Not visualized. Embryo:  Not visualized. Cardiac Activity: Not visualized. Maternal uterus/adnexae: Uterus is anteverted. No myometrial mass lesions are identified. Endometrial stripe thickness measures 9 mm. No endometrial fluid or focal lesion identified. Both ovaries are visualized. Right ovary measures 2.8 x 3.2 x 2.6 cm. Left ovary measures 2.1 x 2.5 x 1.9 cm. Normal follicular changes are demonstrated. Medial to the left ovary, there is a persistent 1.6 cm heterogeneous nodular structure with small central lucency. Color flow to this area is minimal. This may represent ectopic pregnancy. Trace free fluid in the pelvis without evidence of hemorrhage. IMPRESSION: 1. No intrauterine gestational sac, yolk sac, or fetal pole identified. Differential considerations include intrauterine pregnancy too early to be sonographically visualized, missed abortion, or ectopic pregnancy. 2. 1.6 cm heterogeneous left adnexal structure may represent an ectopic pregnancy. Critical Value/emergent results were called by telephone at the time of interpretation on 08/08/2024 at 11:23 pm to provider [facility] , who verbally acknowledged these results. Electronically Signed   By: Elsie Gravely M.D.   On: 08/08/2024 23:42   MDM & MAU COURSE  MDM: High  MAU Course: Orders Placed This Encounter  Procedures   US  OB LESS THAN 14 WEEKS WITH OB TRANSVAGINAL   CBC   hCG, quantitative, pregnancy   Comprehensive metabolic panel   ABO/Rh   Discharge patient   Meds ordered this encounter  Medications   methotrexate  (for ectopic pregnancy) 25 mg/mL chemo injection   oxyCODONE  (ROXICODONE ) 5 MG immediate release tablet    Sig: Take 1 tablet (5 mg total) by mouth every 4 (four) hours as needed for severe pain (pain score 7-10).    Dispense:  30 tablet    Refill:  0   acetaminophen  (TYLENOL ) 500 MG tablet    Sig: Take 1 tablet (500 mg total) by mouth every 6 (six)  hours as needed.    Dispense:  30 tablet    Refill:  0   ondansetron  (ZOFRAN -ODT) 4 MG disintegrating tablet    Sig: Take 1 tablet (4 mg total) by mouth every 8 (eight) hours as needed for nausea or vomiting.    Dispense:  15 tablet    Refill:  0   Sent for labs and ultrasound after review of quants. Critical result called by radiologist confirming ectopic pregnancy. Reviewed trended quants and imaging with Dr. Erik who agreed pt could be treated with methotrexate .  The risks of methotrexate  were reviewed including failure requiring repeat dosing or eventual surgery. She understands that methotrexate  involves frequent return visits to monitor lab values and that she remains at risk of ectopic rupture until her beta is less than assay. ?The patient opts to proceed with methotrexate .  She has no history of hepatic or renal dysfunction, has normal BUN/Cr/LFT's/platelets.  She is felt to be reliable for follow-up. Side effects of photosensitivity & GI upset were discussed.  She knows to avoid direct sunlight and abstain from alcohol, NSAIDs and sexual intercourse for two weeks. She was counseled to discontinue any MVI with folic acid. ?She understands to follow up on D4 (Tuesday) and D7 (Friday) for repeat BHCG and was given the instruction sheet. ?Strict ectopic precautions were reviewed, the patient knows to call with any abdominal  pain, vomiting, fainting, or any concerns with her health.   Methotrexate  given and tolerated well. Discussed follow up here or at [facility], pt prefers to come here for Day 4/7 in case she needs another dose of methotrexate   Appt scheduled, note routed to [facility].  ASSESSMENT   1. Left tubal pregnancy without intrauterine pregnancy    PLAN  Discharge home in stable condition with strict ectopic return precautions.    Follow-up Information     [facility] Follow up.   Specialty: Obstetrics and Gynecology Why: For day 4 (Tuesday, 8/26)  and day 7 (Friday, 8/29) repeat quants. Contact information: [address] [place]  [phone]        [facility] Follow up.   Why: For Day 71 quant and follow up Contact information: [address] [place] [address] [place]  [address] [phone]                Allergies as of 08/09/2024   No Known Allergies      Medication List     TAKE these medications    acetaminophen  500 MG tablet Commonly known as: TYLENOL  Take 1 tablet (500 mg total) by mouth every 6 (six) hours as needed.   albuterol  108 (90 Base) MCG/ACT inhaler Commonly known as: VENTOLIN  HFA Inhale 2 puffs into the lungs every 6 (six) hours as needed for wheezing or shortness of breath.   cetirizine  10 MG tablet Commonly known as: ZyrTEC  Allergy Take 1 tablet (10 mg total) by mouth at bedtime.   fluticasone  50 MCG/ACT nasal spray Commonly known as: FLONASE  Place 2 sprays into both nostrils daily.   ondansetron  4 MG disintegrating tablet Commonly known as: ZOFRAN -ODT Take 1 tablet (4 mg total) by mouth every 8 (eight) hours as needed for nausea or vomiting.   oxyCODONE  5 MG immediate release tablet Commonly known as: Roxicodone  Take 1 tablet (5 mg total) by mouth every 4 (four) hours as needed for severe pain (pain score 7-10).   sertraline 50 MG tablet Commonly known as: ZOLOFT Take by mouth.       Cornell Finder, CNM, MSN, IBCLC Certified Nurse Midwife, [facility]

## 2024-08-11 ENCOUNTER — Other Ambulatory Visit

## 2024-08-11 ENCOUNTER — Other Ambulatory Visit: Payer: Self-pay | Admitting: Obstetrics and Gynecology

## 2024-08-11 ENCOUNTER — Telehealth: Payer: Self-pay

## 2024-08-11 DIAGNOSIS — D6861 Antiphospholipid syndrome: Secondary | ICD-10-CM

## 2024-08-12 ENCOUNTER — Inpatient Hospital Stay (HOSPITAL_COMMUNITY): Attending: Obstetrics and Gynecology

## 2024-08-12 ENCOUNTER — Institutional Professional Consult (permissible substitution) (INDEPENDENT_AMBULATORY_CARE_PROVIDER_SITE_OTHER): Admitting: Adult Health

## 2024-08-14 ENCOUNTER — Other Ambulatory Visit: Payer: Self-pay

## 2024-08-14 ENCOUNTER — Inpatient Hospital Stay (HOSPITAL_COMMUNITY)
Admission: AD | Admit: 2024-08-14 | Discharge: 2024-08-14 | Disposition: A | Discharge status: Home / Self Care | Payer: Self-pay | Attending: Obstetrics and Gynecology | Admitting: Obstetrics and Gynecology

## 2024-08-14 DIAGNOSIS — O26891 Other specified pregnancy related conditions, first trimester: Secondary | ICD-10-CM | POA: Diagnosis not present

## 2024-08-14 DIAGNOSIS — F32A Depression, unspecified: Secondary | ICD-10-CM | POA: Insufficient documentation

## 2024-08-14 DIAGNOSIS — O00102 Left tubal pregnancy without intrauterine pregnancy: Secondary | ICD-10-CM | POA: Insufficient documentation

## 2024-08-14 DIAGNOSIS — R200 Anesthesia of skin: Secondary | ICD-10-CM | POA: Insufficient documentation

## 2024-08-14 DIAGNOSIS — F411 Generalized anxiety disorder: Secondary | ICD-10-CM | POA: Diagnosis not present

## 2024-08-14 DIAGNOSIS — N939 Abnormal uterine and vaginal bleeding, unspecified: Secondary | ICD-10-CM

## 2024-08-14 DIAGNOSIS — O99331 Smoking (tobacco) complicating pregnancy, first trimester: Secondary | ICD-10-CM | POA: Diagnosis not present

## 2024-08-14 DIAGNOSIS — F17210 Nicotine dependence, cigarettes, uncomplicated: Secondary | ICD-10-CM | POA: Diagnosis not present

## 2024-08-14 DIAGNOSIS — Z3A01 Less than 8 weeks gestation of pregnancy: Secondary | ICD-10-CM | POA: Diagnosis not present

## 2024-08-14 DIAGNOSIS — O99341 Other mental disorders complicating pregnancy, first trimester: Secondary | ICD-10-CM | POA: Diagnosis not present

## 2024-08-14 LAB — URINALYSIS, ROUTINE W REFLEX MICROSCOPIC
Bilirubin Urine: NEGATIVE
Glucose, UA: NEGATIVE mg/dL
Ketones, ur: NEGATIVE mg/dL
Leukocytes,Ua: NEGATIVE
Nitrite: NEGATIVE
Protein, ur: 100 mg/dL — AB
RBC / HPF: >50 RBC/hpf (ref 0–5)
Specific Gravity, Urine: 1.019 (ref 1.005–1.030)
pH: 5 (ref 5.0–8.0)

## 2024-08-14 LAB — CBC
HCT: 36.3 % (ref 36.0–46.0)
Hemoglobin: 11.9 g/dL — ABNORMAL LOW (ref 12.0–15.0)
MCH: 30.4 pg (ref 26.0–34.0)
MCHC: 32.8 g/dL (ref 30.0–36.0)
MCV: 92.8 fL (ref 80.0–100.0)
Platelets: 204 K/uL (ref 150–400)
RBC: 3.91 MIL/uL (ref 3.87–5.11)
RDW: 13 % (ref 11.5–15.5)
WBC: 6.1 K/uL (ref 4.0–10.5)
nRBC: 0 % (ref 0.0–0.2)

## 2024-08-14 LAB — COMPREHENSIVE METABOLIC PANEL WITH GFR
ALT: 19 U/L (ref 0–44)
AST: 22 U/L (ref 15–41)
Albumin: 3.8 g/dL (ref 3.5–5.0)
Alkaline Phosphatase: 30 U/L — ABNORMAL LOW (ref 38–126)
Anion gap: 10 (ref 5–15)
BUN: 10 mg/dL (ref 6–20)
CO2: 19 mmol/L — ABNORMAL LOW (ref 22–32)
Calcium: 9.3 mg/dL (ref 8.9–10.3)
Chloride: 111 mmol/L (ref 98–111)
Creatinine, Ser: 0.69 mg/dL (ref 0.44–1.00)
GFR, Estimated: >60 mL/min (ref >60)
Glucose, Bld: 91 mg/dL (ref 70–99)
Potassium: 4.1 mmol/L (ref 3.5–5.1)
Sodium: 140 mmol/L (ref 135–145)
Total Bilirubin: 0.6 mg/dL (ref 0.0–1.2)
Total Protein: 6.4 g/dL — ABNORMAL LOW (ref 6.5–8.1)

## 2024-08-14 LAB — HCG, QUANTITATIVE, PREGNANCY: hCG, Beta Chain, Quant, S: 106 m[IU]/mL — ABNORMAL HIGH (ref <5)

## 2024-08-14 NOTE — Discharge Instructions (Signed)
 You were seen in the MAU for vaginal bleeding and lower back pain. The vaginal bleeding is likely related to your body passing the pregnancy. Your labs were stable. Please continue to monitor your bleeding, and come back to the MAU if it is not improving or you are feeling dizzy/lightheaded. Please continue to follow up with your OB/GYN to monitor your labs.

## 2024-08-14 NOTE — MAU Note (Signed)
 [name] is a 33 y.o. at [redacted]w[redacted]d here in MAU reporting: she received Methotrexate  last Friday and began having heavy VB last night, states passed 10 half dollar sized clot and saturated a sanitary napkin.  Reports VB has continued today, but just passed one clot. Reports lower abdominal cramping, 5/10 pain scale. Also reports tingling in bilateral legs, hurts to walk.  LMP: 06/24/2024 Onset of complaint: last night Pain score: 5 Vitals:   08/14/24 1004  BP: 120/80  Pulse: 78  Resp: 18  Temp: 98.3 F (36.8 C)  SpO2: 100%     FHT: NA  Lab orders placed from triage: None

## 2024-08-14 NOTE — MAU Provider Note (Signed)
 Chief Complaint:  Vaginal Bleeding   HPI   [name] is a 33 y.o. H2E7957 at [redacted]w[redacted]d who presents to maternity admissions reporting 1 day of heavy vaginal bleeding and bilateral leg numbness.  Of note, the patient has been getting worked up for an ectopic pregnancy.  She was given methotrexate  on 8/22.  She states that yesterday evening, she started having heavy bleeding, soaking through 1-2 pads every hour with associated suprapubic pain.  She also notes passing about 5 half dollar sized clots last night and 2-3 similar sized clots this morning.  She also describes bilateral leg tingling and numbness intermittently.  She took 1 g Tylenol  yesterday afternoon with moderate relief.  Pregnancy Course:  Imaging previously completed on 8/22 showing ectopic pregnancy in left fallopian tube. Quantitative hCGs available for review are as follows: 8/22: 943 8/28: 106   Past Medical History:  Diagnosis Date   Anemia    Asthma    Depression    Generalized anxiety disorder    GERD (gastroesophageal reflux disease)    History of chlamydia    SVD (spontaneous vaginal delivery) 09/06/2019   OB History  Gravida Para Term Preterm AB Living  7 2 2  4 2   SAB IAB Ectopic Multiple Live Births  3 1  0 2    # Outcome Date GA Lbr Len/2nd Weight Sex Type Anes PTL Lv  7 Current           6 SAB 04/22/24 [redacted]w[redacted]d         5 Term 09/06/19 [redacted]w[redacted]d / 00:33 3470 g M Vag-Spont EPI  LIV  4 SAB 2019          3 SAB 2019          2 IAB 2015          1 Term 12/26/11 [redacted]w[redacted]d 14:04 / 00:34 3320 g F Vag-Spont EPI  LIV   Past Surgical History:  Procedure Laterality Date   DILATION AND CURETTAGE OF UTERUS     Family History  Problem Relation Age of Onset   Anesthesia problems Mother    Arthritis Mother    Asthma Brother    Cancer Maternal Grandmother        cervix   Seizures Maternal Grandmother    Hypertension Maternal Grandfather    Cancer Maternal Grandfather        prostate   Hypertension Paternal  Grandfather    Seizures Maternal Aunt    Anesthesia problems Maternal Aunt    Hypertension Paternal Aunt    Cancer Paternal Aunt        lung   Hypertension Paternal Uncle    Mental retardation Cousin        cp x2   Breast cancer Neg Hx    Social History   Tobacco Use   Smoking status: Every Day    Current packs/day: 0.25    Average packs/day: 0.3 packs/day for 2.0 years (0.5 ttl pk-yrs)    Types: Cigarettes   Smokeless tobacco: Never  Vaping Use   Vaping status: Never Used  Substance Use Topics   Alcohol use: Yes    Comment: occasionally   Drug use: No   No Known Allergies No medications prior to admission.    I have reviewed patient's Past Medical Hx, Surgical Hx, Family Hx, Social Hx, medications and allergies.   ROS  Pertinent items noted in HPI and remainder of comprehensive ROS otherwise negative.   PHYSICAL EXAM  Patient Vitals for  the past 24 hrs:  BP Temp Temp src Pulse Resp SpO2 Height Weight  08/14/24 1412 118/69 -- -- 68 17 -- -- --  08/14/24 1004 120/80 98.3 F (36.8 C) Oral 78 18 100 % -- --  08/14/24 0957 -- -- -- -- -- -- 5' 5 (1.651 m) 95.3 kg    Constitutional: Well-developed, well-nourished female in no acute distress.  Cardiovascular: normal rate & rhythm, warm and well-perfused Respiratory: normal effort, no problems with respiration noted GI: Abd soft, non-distended, tenderness to palpation in suprapubic region MS: Extremities nontender, no edema, normal ROM Neurologic: Alert and oriented x 4.  GU: no CVA tenderness Pelvic: Deferred       Labs: Results for orders placed or performed during the hospital encounter of 08/14/24 (from the past 24 hours)  CBC     Status: Abnormal   Collection Time: 08/14/24 10:33 AM  Result Value Ref Range   WBC 6.1 4.0 - 10.5 K/uL   RBC 3.91 3.87 - 5.11 MIL/uL   Hemoglobin 11.9 (L) 12.0 - 15.0 g/dL   HCT 63.6 63.9 - 53.9 %   MCV 92.8 80.0 - 100.0 fL   MCH 30.4 26.0 - 34.0 pg   MCHC 32.8 30.0 - 36.0  g/dL   RDW 86.9 88.4 - 84.4 %   Platelets 204 150 - 400 K/uL   nRBC 0.0 0.0 - 0.2 %  Comprehensive metabolic panel     Status: Abnormal   Collection Time: 08/14/24 10:33 AM  Result Value Ref Range   Sodium 140 135 - 145 mmol/L   Potassium 4.1 3.5 - 5.1 mmol/L   Chloride 111 98 - 111 mmol/L   CO2 19 (L) 22 - 32 mmol/L   Glucose, Bld 91 70 - 99 mg/dL   BUN 10 6 - 20 mg/dL   Creatinine, Ser 9.30 0.44 - 1.00 mg/dL   Calcium 9.3 8.9 - 89.6 mg/dL   Total Protein 6.4 (L) 6.5 - 8.1 g/dL   Albumin 3.8 3.5 - 5.0 g/dL   AST 22 15 - 41 U/L   ALT 19 0 - 44 U/L   Alkaline Phosphatase 30 (L) 38 - 126 U/L   Total Bilirubin 0.6 0.0 - 1.2 mg/dL   GFR, Estimated >39 >39 mL/min   Anion gap 10 5 - 15  hCG, quantitative, pregnancy     Status: Abnormal   Collection Time: 08/14/24 10:33 AM  Result Value Ref Range   hCG, Beta Chain, Quant, S 106 (H) <5 mIU/mL  Urinalysis, Routine w reflex microscopic -Urine, Clean Catch     Status: Abnormal   Collection Time: 08/14/24 10:45 AM  Result Value Ref Range   Color, Urine AMBER (A) YELLOW   APPearance CLOUDY (A) CLEAR   Specific Gravity, Urine 1.019 1.005 - 1.030   pH 5.0 5.0 - 8.0   Glucose, UA NEGATIVE NEGATIVE mg/dL   Hgb urine dipstick MODERATE (A) NEGATIVE   Bilirubin Urine NEGATIVE NEGATIVE   Ketones, ur NEGATIVE NEGATIVE mg/dL   Protein, ur 899 (A) NEGATIVE mg/dL   Nitrite NEGATIVE NEGATIVE   Leukocytes,Ua NEGATIVE NEGATIVE   RBC / HPF >50 0 - 5 RBC/hpf   WBC, UA 0-5 0 - 5 WBC/hpf   Bacteria, UA RARE (A) NONE SEEN   Squamous Epithelial / HPF 0-5 0 - 5 /HPF    Imaging:  No results found.  MDM & MAU COURSE  MDM: Moderate  MAU Course: Orders Placed This Encounter  Procedures   CBC   Comprehensive  metabolic panel   hCG, quantitative, pregnancy   Urinalysis, Routine w reflex microscopic -Urine, Clean Catch   Discharge patient   No orders of the defined types were placed in this encounter.  Patient presenting with heavy vaginal  bleeding.  CBC, CMP and UA unremarkable.  Quant hCG trending down appropriately.  HPI, exam and labs consistent with ectopic pregnancy previously diagnosed and treated with methotrexate .  Discussed results with patient who is comfortable with plan of discharge and close follow-up. Bilateral leg numbness later related to inflammation.  Counseled on massage, warm and cold compresses, and Tylenol .  All questions answered, return precautions discussed and included in discharge paperwork.   ASSESSMENT   1. Episode of heavy vaginal bleeding   2. Left tubal pregnancy without intrauterine pregnancy     PLAN  Discharge home in stable condition with return precautions.  Patient reminded to go to follow-up appointment tomorrow at OB/GYN office for repeat lab work.  Follow-up Information     [facility] Follow up.   Specialty: Obstetrics and Gynecology Why: for repeat bloodwork Contact information: [address] [place] [address] [phone]                 Allergies as of 08/14/2024   No Known Allergies      Medication List     STOP taking these medications    cetirizine  10 MG tablet Commonly known as: ZyrTEC  Allergy       TAKE these medications    acetaminophen  500 MG tablet Commonly known as: TYLENOL  Take 1 tablet (500 mg total) by mouth every 6 (six) hours as needed.   albuterol  108 (90 Base) MCG/ACT inhaler Commonly known as: VENTOLIN  HFA Inhale 2 puffs into the lungs every 6 (six) hours as needed for wheezing or shortness of breath.   fluticasone  50 MCG/ACT nasal spray Commonly known as: FLONASE  Place 2 sprays into both nostrils daily.   ondansetron  4 MG disintegrating tablet Commonly known as: ZOFRAN -ODT Take 1 tablet (4 mg total) by mouth every 8 (eight) hours as needed for nausea or vomiting.   oxyCODONE  5 MG immediate release tablet Commonly known as: Roxicodone  Take 1 tablet (5 mg total) by mouth every 4 (four) hours as  needed for severe pain (pain score 7-10).   sertraline 50 MG tablet Commonly known as: ZOLOFT Take by mouth.        Charlie Courts, MD  Family Medicine - Obstetrics Fellow

## 2024-08-15 ENCOUNTER — Inpatient Hospital Stay (HOSPITAL_COMMUNITY): Attending: Obstetrics and Gynecology

## 2024-09-12 ENCOUNTER — Telehealth: Payer: Self-pay

## 2024-09-23 ENCOUNTER — Ambulatory Visit

## 2024-09-23 ENCOUNTER — Encounter

## 2024-09-26 ENCOUNTER — Ambulatory Visit: Attending: Obstetrics and Gynecology

## 2024-09-26 ENCOUNTER — Ambulatory Visit

## 2024-09-26 DIAGNOSIS — N96 Recurrent pregnancy loss: Secondary | ICD-10-CM

## 2024-09-26 DIAGNOSIS — Z31430 Encounter of female for testing for genetic disease carrier status for procreative management: Secondary | ICD-10-CM

## 2024-09-26 DIAGNOSIS — Z818 Family history of other mental and behavioral disorders: Secondary | ICD-10-CM

## 2024-09-26 NOTE — Progress Notes (Addendum)
 [facility] for [facility]etal [facility] at [facility] [address] Phone:  [phone]   Fax:  [phone]      In-Person Genetic Counseling Clinic Note:   I spoke with 33 y.o. [name] today to discuss her history of recurrent miscarriages. She was referred by Verta Blossom, CNM. She was accompanied by her husband [name].   Pregnancy History:    [id number]. [name] is not currently pregnant. She had a recent ectopic pregnancy in 08/2024. [name] has two healthy children; her daughter is from a separate partnership. She had one IAB for personal reasons. With [name], they have had two early SAB's, followed by a healthy son. She has since had one early SAB and one ectopic pregnancy. Denies major personal health concerns. We discussed folic acid intake as recommended by OB, typically 400 mcg daily at least 1-3 months before pregnancy and during the first 12 weeks of pregnancy if there is no personal/family history of NTDs. Reports occasional alcohol use. Denies using tobacco or street drugs.   Family History:    A three-generation pedigree was created and scanned into Epic under the Media tab.  Patient reports a female 33 yo maternal first cousin who was diagnosed with autism and intellectual disability, no genetics performed. She reports a female 33 yo paternal first cousin once removed who was diagnosed with autism, u/k if genetic testing was performed. Genetic testing for individuals with a clinical diagnosis of autism yields an explanation in only about 30% of cases, and the remaining 70% of cases are left with unknown etiology. Having an affected family member may increase the chance that [name]'s children will have autism; however, without genetic testing performed on affected family members, it is difficult to assess risk to the pregnancy and other family members. The risk may be up to 50% in the case of an identified genetic cause. We also discussed and  offered screening for fragile X syndrome, which is one of the most common causes of inherited intellectual disability and autism in males. Fragile X syndrome affects females as well. [name] elected fragile X syndrome carrier screening.  Patient reports another female 33 yo maternal first cousin with cerebral palsy. She reports this was due to premature birth. No known genetics performed.   [name] reports his daughter from a prior relationship has learning delays, no known genetic testing performed. [name] reports his maternal aunt has physical disabilities since infancy and has needed a wheelchair. He reports this is also present on her paternal side. No other information is known. We reviewed this as well as the cerebral palsy history on [name]'s family history may each be due to genetic, multifactorial, or environmental factors. Without additional testing, recurrence risk is unknown. We reviewed expanded carrier screening may provide additional information; however, this is not a guarantee.  Patient ethnicity reported as Black and [name] ethnicity reported as Black. Denies Ashkenazi Jewish ancestry.  Family history otherwise not remarkable for consanguinity, individuals with birth defects, intellectual disability, autism spectrum disorder, multiple spontaneous abortions, still births, or unexplained neonatal death.   Recurrent Pregnancy Loss:  Recurrent pregnancy loss (RPL) is defined as two or more clinically recognized pregnancy losses occurring prior to [redacted] weeks gestation. Fewer than 5% of couples have two miscarriages in a row, and only 1% will experience three or more. Around 50% of individuals experiencing RPL will have an identifiable etiology to their history.  We reviewed that there are many potential causes of recurrent miscarriages,  including anatomic, immunologic, endocrine, and genetic factors.  The overall rate of recognized pregnancy loss is approximately 20%, with the majority occurring in the  first trimester.  Chromosomal causes account for about 50%-70% of first trimester losses but are less commonly cited as a cause for late gestational pregnancy loss. Most chromosome differences are a result of a random change in the sperm or egg that conceived the fetus; however, 2-5% of all couples with RPL will be identified with a balanced chromosome translocation. In couples in which one partner is a carrier of a balanced translocation, this can result in an increased risk for infertility, RPL, and offspring with abnormal chromosomes. Chromosome translocations can be detected by karyotype.  Single gene conditions can also be associated with RPL, but are less likely causes of early pregnancy loss compared with chromosomal differences. Single gene conditions can be more likely associated with later gestation losses. Certain autosomal recessive and X-linked conditions can be lethal in fetuses. If each member of a couple is a carrier for an autosomal recessive condition, there is a 25% chance the fetus would be affected with the condition. Certain X-linked conditions may be lethal in female fetuses. These can be inherited from unaffected or slightly affected mothers. We discussed and offered carrier screening per the ACOG Committee Opinion 691 as well as expanded carrier screening for autosomal recessive and X-linked conditions. The technical aspects, benefits, risks, and limitations of carrier screening were discussed. We reviewed that if the patient is found to be a carrier for a genetic condition, carrier screening is available for the partner. If both are found to be carriers for the same condition, there is a 25% chance of the pregnancy to be affected.  Due to this couple's history of RPL and desire for a future pregnancy, chromosome analysis by karyotyping was offered to both, and carrier screening was offered to [name]. This testing may help to inform the cause of the previous pregnancy losses, and may  help the couple make informed reproductive decisions moving forward. They both elected karyotype. [name] elected ACOG-recommended carrier screening as well as fragile X carrier screening.  Dr. Arna, MFM briefly met with the couple to discuss the patient's APS test results. Please see his comment for details.   Available Genetic Screening/Testing Options for Future Pregnancy:   We reviewed available genetic screening and testing options for a future pregnancy, including noninvasive prenatal screening (NIPS) and diagnostic testing. We discussed the technical aspects, risks, benefits, and limitations of genetic screening and diagnostic testing.   We also reviewed available preconception testing, such as PGT for IVF pregnancies if a balanced chromosome translocation is present or if both [name] and her partner were found to be carriers for the same condition. PGT can also be performed to screen for aneuploidies in the embryo.    Plan of Care:   Karyotype for both [name] and her husband [name] was drawn today. We will inform each of their results. Horizon carrier screening for [name] was drawn today (alpha thalassemia, beta-hemoglobinopathies, cystic fibrosis, spinal muscular atrophy, and fragile X syndrome). We will inform her of these results.   Informed consent was obtained. All questions were answered.   95 minutes were spent on the date of the encounter in service to the patient including preparation, face-to-face consultation, discussion of test reports and available next steps, pedigree construction, genetic risk assessment, documentation, and care coordination.    Thank you for sharing in the care of [name] with us .  Please do not hesitate  to contact us  at [phone] if you have any questions.   Lauraine Bodily, MS, [facility] Certified Genetic Counselor   Genetic counseling student involved in appointment: No.  MFM Note Patient had genetic counseling today because of recurrent  miscarriages. She was treated with methotrexate  after diagnosis of ectopic pregnancy and her most-recent hCG is undetectable. I reviewed her labs. She had 2 sets of antiphospholipid antibodies in May and August 2025. Anticardiolipin antibodies, lupus anticoagulant and anti-beta2 glycoprotein1 antibodies were not increased on both occasions. We do not consider other antibodies to be part of antiphospholipid syndrome.  I met with the couple at our Genetic Counselor's office and reassured the patient that she does not have antiphospholipid syndrome. She does not require prophylactic heparin anticoagulation in this pregnancy.  I discussed the benefit of preconception folic acid 400 milligrams daily one prenatal vitamin daily at least 1 month before conception. I encouraged her to continue prenatal vitamins. I counseled the patient to avoid pregnancy for at least 3 to 4 months and wait for regular menstrual cycles.  Fredia Fresh, MD

## 2024-09-26 NOTE — Addendum Note (Signed)
 Addended by: BALDEMAR LAURAINE DEL on: 09/26/2024 11:36 AM   Modules accepted: Orders

## 2024-10-11 LAB — CHROMOSOME, BLOOD, ROUTINE
Cells Analyzed: 20
Cells Counted: 20
Cells Karyotyped: 2
GTG Band Resolution Achieved: 500

## 2024-10-14 ENCOUNTER — Telehealth: Payer: Self-pay

## 2024-10-14 LAB — HORIZON CUSTOM: REPORT SUMMARY: NEGATIVE

## 2024-10-14 NOTE — Telephone Encounter (Signed)
 I spoke with the patient and her husband to return their karyotype results.  Karyotype returned as normal 27, XX. No balanced chromosome translocations were detected that would increase risk for miscarriage or infertility. This does not exclude subtle rearrangements that would not be detected by a karyotype.   Her husband [name] DOB 03/06/1988 also had a normal karyotype 46, XY.  [name]'s Horizon carrier screening is pending.  Lauraine Bodily, MS, [facility] Certified Genetic Counselor [facility] for Maternal Fetal Care [phone]

## 2024-10-16 ENCOUNTER — Telehealth: Payer: Self-pay

## 2024-10-16 NOTE — Telephone Encounter (Signed)
 I called the patient to discuss her recent Horizon carrier screening results. She was not found to be a carrier for the five conditions screened for (fragile X syndrome, alpha thalassemia, beta-hemoglobinopathies, cystic fibrosis, and spinal muscular atrophy). This significantly reduces but does not eliminate the chance that she is a carrier for those conditions. Please see report for details.  Lauraine Bodily, MS, [facility] Certified Genetic Counselor [facility] for Maternal Fetal Care [phone]

## 2024-11-03 ENCOUNTER — Ambulatory Visit: Payer: Medicaid Other | Admitting: Obstetrics and Gynecology

## 2024-11-18 ENCOUNTER — Telehealth: Admitting: Family Medicine

## 2024-11-18 DIAGNOSIS — R0602 Shortness of breath: Secondary | ICD-10-CM

## 2024-11-18 DIAGNOSIS — J209 Acute bronchitis, unspecified: Secondary | ICD-10-CM | POA: Diagnosis not present

## 2024-11-18 DIAGNOSIS — R051 Acute cough: Secondary | ICD-10-CM | POA: Diagnosis not present

## 2024-11-18 MED ORDER — PROMETHAZINE-DM 6.25-15 MG/5ML PO SYRP: 5.0000 mL | ORAL_SOLUTION | Freq: Four times a day (QID) | ORAL | 0 refills | Status: DC | PRN

## 2024-11-18 MED ORDER — ALBUTEROL SULFATE HFA 108 (90 BASE) MCG/ACT IN AERS: 1.0000 | INHALATION_SPRAY | RESPIRATORY_TRACT | 0 refills | Status: AC | PRN

## 2024-11-18 MED ORDER — AZITHROMYCIN 250 MG PO TABS: ORAL_TABLET | ORAL | 0 refills | Status: AC

## 2024-11-18 MED ORDER — PREDNISONE 20 MG PO TABS: 20.0000 mg | ORAL_TABLET | Freq: Every day | ORAL | 0 refills | Status: AC

## 2024-11-18 MED ORDER — BENZONATATE 200 MG PO CAPS: 200.0000 mg | ORAL_CAPSULE | Freq: Three times a day (TID) | ORAL | 0 refills | Status: DC | PRN

## 2024-11-18 NOTE — Patient Instructions (Addendum)
 Larhonda R Schar, thank you for joining Chiquita CHRISTELLA Barefoot, NP for today's virtual visit.  While this provider is not your primary care provider (PCP), if your PCP is located in our provider database this encounter information will be shared with them immediately following your visit.   A [facility] MyChart account gives you access to today's visit and all your visits, tests, and labs performed at [facility]  click here if you don't have a [facility] MyChart account or go to [email or web address]  Consent: (Patient) [name] provided verbal consent for this virtual visit at the beginning of the encounter.  Current Medications:  Current Outpatient Medications:    albuterol  (VENTOLIN  HFA) 108 (90 Base) MCG/ACT inhaler, Inhale 1-2 puffs into the lungs every 4 (four) hours as needed for wheezing or shortness of breath (cough)., Disp: 8 g, Rfl: 0   azithromycin  (ZITHROMAX ) 250 MG tablet, Take 2 tablets on day 1, then 1 tablet daily on days 2 through 5, Disp: 6 tablet, Rfl: 0   benzonatate  (TESSALON ) 200 MG capsule, Take 1 capsule (200 mg total) by mouth 3 (three) times daily as needed for cough., Disp: 30 capsule, Rfl: 0   predniSONE  (DELTASONE ) 20 MG tablet, Take 1 tablet (20 mg total) by mouth daily with breakfast for 5 days., Disp: 5 tablet, Rfl: 0   promethazine-dextromethorphan (PROMETHAZINE-DM) 6.25-15 MG/5ML syrup, Take 5 mLs by mouth 4 (four) times daily as needed for cough., Disp: 118 mL, Rfl: 0   acetaminophen  (TYLENOL ) 500 MG tablet, Take 1 tablet (500 mg total) by mouth every 6 (six) hours as needed., Disp: 30 tablet, Rfl: 0   fluticasone  (FLONASE ) 50 MCG/ACT nasal spray, Place 2 sprays into both nostrils daily., Disp: 16 g, Rfl: 6   ondansetron  (ZOFRAN -ODT) 4 MG disintegrating tablet, Take 1 tablet (4 mg total) by mouth every 8 (eight) hours as needed for nausea or vomiting., Disp: 15 tablet, Rfl: 0   oxyCODONE  (ROXICODONE ) 5 MG immediate release tablet, Take 1  tablet (5 mg total) by mouth every 4 (four) hours as needed for severe pain (pain score 7-10)., Disp: 30 tablet, Rfl: 0   sertraline (ZOLOFT) 50 MG tablet, Take by mouth., Disp: , Rfl:    Medications ordered in this encounter:  Meds ordered this encounter  Medications   azithromycin  (ZITHROMAX ) 250 MG tablet    Sig: Take 2 tablets on day 1, then 1 tablet daily on days 2 through 5    Dispense:  6 tablet    Refill:  0    Supervising Provider:   LAMPTEY, PHILIP O [[id number]]   promethazine-dextromethorphan (PROMETHAZINE-DM) 6.25-15 MG/5ML syrup    Sig: Take 5 mLs by mouth 4 (four) times daily as needed for cough.    Dispense:  118 mL    Refill:  0    Supervising Provider:   BLAISE ALEENE KIDD [[id number]]   benzonatate  (TESSALON ) 200 MG capsule    Sig: Take 1 capsule (200 mg total) by mouth 3 (three) times daily as needed for cough.    Dispense:  30 capsule    Refill:  0    Supervising Provider:   LAMPTEY, PHILIP O [[id number]]   albuterol  (VENTOLIN  HFA) 108 (90 Base) MCG/ACT inhaler    Sig: Inhale 1-2 puffs into the lungs every 4 (four) hours as needed for wheezing or shortness of breath (cough).    Dispense:  8 g    Refill:  0    Supervising Provider:   BLAISE ALEENE KIDD [phone]  predniSONE  (DELTASONE ) 20 MG tablet    Sig: Take 1 tablet (20 mg total) by mouth daily with breakfast for 5 days.    Dispense:  5 tablet    Refill:  0    Supervising Provider:   BLAISE ALEENE KIDD [[id number]]     *If you need refills on other medications prior to your next appointment, please contact your pharmacy*  Follow-Up: Call back or seek an in-person evaluation if the symptoms worsen or if the condition fails to improve as anticipated.  [facility] [phone]  Other Instructions   - Take meds as prescribed - Rest voice - Use a cool mist humidifier especially during the winter months when heat dries out the air. - Use saline nose sprays frequently to help soothe nasal passages if  they are drying out. - Stay hydrated by drinking plenty of fluids - Keep thermostat turn down low to prevent drying out which can cause a dry cough.  If you do not improve you will need a follow up visit in person.                 If you have been instructed to have an in-person evaluation today at a local Urgent Care facility, please use the link below. It will take you to a list of all of our available [facility], including address, phone number and hours of operation. Please do not delay care.  [facility]  If you or a family member do not have a primary care provider, use the link below to schedule a visit and establish care. When you choose a [facility] primary care physician or advanced practice provider, you gain a long-term partner in health. Find a Primary Care Provider  Learn more about [place]'s in-office and virtual care options: [facility] - Get Care Now

## 2024-11-18 NOTE — Progress Notes (Signed)
 Virtual Visit Consent   [name], you are scheduled for a virtual visit with a [facility] provider today. Just as with appointments in the office, your consent must be obtained to participate. Your consent will be active for this visit and any virtual visit you may have with one of our providers in the next 365 days. If you have a MyChart account, a copy of this consent can be sent to you electronically.  As this is a virtual visit, video technology does not allow for your provider to perform a traditional examination. This may limit your provider's ability to fully assess your condition. If your provider identifies any concerns that need to be evaluated in person or the need to arrange testing (such as labs, EKG, etc.), we will make arrangements to do so. Although advances in technology are sophisticated, we cannot ensure that it will always work on either your end or our end. If the connection with a video visit is poor, the visit may have to be switched to a telephone visit. With either a video or telephone visit, we are not always able to ensure that we have a secure connection.  By engaging in this virtual visit, you consent to the provision of healthcare and authorize for your insurance to be billed (if applicable) for the services provided during this visit. Depending on your insurance coverage, you may receive a charge related to this service.  I need to obtain your verbal consent now. Are you willing to proceed with your visit today? [name] has provided verbal consent on 11/18/2024 for a virtual visit (video or telephone). Chiquita CHRISTELLA Barefoot, NP  Date: 11/18/2024 9:29 AM   Virtual Visit via Video Note   I, Chiquita CHRISTELLA Barefoot, connected with  [name]  ([id number], 09/10/1991) on 11/18/24 at  9:30 AM EST by a video-enabled telemedicine application and verified that I am speaking with the correct person using two identifiers.  Location: Patient: Virtual Visit Location Patient:  Home Provider: Virtual Visit Location Provider: Home Office   I discussed the limitations of evaluation and management by telemedicine and the availability of in person appointments. The patient expressed understanding and agreed to proceed.    History of Present Illness: [name] is a 33 y.o. who identifies as a female who was assigned female at birth, and is being seen today for congestion  Onset was 8 days ago- with sore throat, mucus, and voice loss in 24 hours of onset. Cough that is wet- mucus is stuck. Makes her winded- when coughing and talking  Associated symptoms are still has sore throat, nasal drainage going back into throat. Rib cage pain post cough. Very tired.  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/01/2024   Adult BMI 32.0-32.9 kg/sq m 12/20/2023   Abnormal cervical Papanicolaou smear 10/23/2023   Pelvic pain in female 10/23/2023   Cigar smoker motivated to quit 12/13/2022   Bilateral sciatica 10/17/2022   Paresthesias 10/17/2022   Generalized anxiety disorder 10/12/2022    Allergies: No Known Allergies Medications:  Current Outpatient Medications:    acetaminophen  (TYLENOL ) 500  MG tablet, Take 1 tablet (500 mg total) by mouth every 6 (six) hours as needed., Disp: 30 tablet, Rfl: 0   albuterol  (VENTOLIN  HFA) 108 (90 Base) MCG/ACT inhaler, Inhale 2 puffs into the lungs every 6 (six) hours as needed for wheezing or shortness of breath., Disp: 8 g, Rfl: 0   fluticasone  (FLONASE ) 50 MCG/ACT nasal spray, Place 2 sprays into both nostrils daily., Disp: 16 g, Rfl: 6   ondansetron  (ZOFRAN -ODT) 4 MG  disintegrating tablet, Take 1 tablet (4 mg total) by mouth every 8 (eight) hours as needed for nausea or vomiting., Disp: 15 tablet, Rfl: 0   oxyCODONE  (ROXICODONE ) 5 MG immediate release tablet, Take 1 tablet (5 mg total) by mouth every 4 (four) hours as needed for severe pain (pain score 7-10)., Disp: 30 tablet, Rfl: 0   sertraline (ZOLOFT) 50 MG tablet, Take by mouth., Disp: , Rfl:   Observations/Objective: Patient is well-developed, well-nourished in no acute distress.  Resting comfortably  at home.  Head is normocephalic, atraumatic.  No labored breathing.  Speech is clear and coherent with logical content.  Patient is alert and oriented at baseline.  Wet Cough noted  Hoarseness present   Assessment and Plan:  1. Acute bronchitis, unspecified organism (Primary)  - azithromycin  (ZITHROMAX ) 250 MG tablet; Take 2 tablets on day 1, then 1 tablet daily on days 2 through 5  Dispense: 6 tablet; Refill: 0 - promethazine-dextromethorphan (PROMETHAZINE-DM) 6.25-15 MG/5ML syrup; Take 5 mLs by mouth 4 (four) times daily as needed for cough.  Dispense: 118 mL; Refill: 0 - benzonatate  (TESSALON ) 200 MG capsule; Take 1 capsule (200 mg total) by mouth 3 (three) times daily as needed for cough.  Dispense: 30 capsule; Refill: 0 - albuterol  (VENTOLIN  HFA) 108 (90 Base) MCG/ACT inhaler; Inhale 1-2 puffs into the lungs every 4 (four) hours as needed for wheezing or shortness of breath (cough).  Dispense: 8 g; Refill: 0 - predniSONE  (DELTASONE ) 20 MG tablet; Take 1 tablet (20 mg total) by mouth daily with breakfast for 5 days.  Dispense: 5 tablet; Refill: 0  2. Acute cough  - promethazine-dextromethorphan (PROMETHAZINE-DM) 6.25-15 MG/5ML syrup; Take 5 mLs by mouth 4 (four) times daily as needed for cough.  Dispense: 118 mL; Refill: 0 - benzonatate  (TESSALON ) 200 MG capsule; Take 1 capsule (200 mg total) by mouth 3 (three) times daily as needed for cough.  Dispense: 30 capsule; Refill: 0 - albuterol   (VENTOLIN  HFA) 108 (90 Base) MCG/ACT inhaler; Inhale 1-2 puffs into the lungs every 4 (four) hours as needed for wheezing or shortness of breath (cough).  Dispense: 8 g; Refill: 0  3. Shortness of breath  - albuterol  (VENTOLIN  HFA) 108 (90 Base) MCG/ACT inhaler; Inhale 1-2 puffs into the lungs every 4 (four) hours as needed for wheezing or shortness of breath (cough).  Dispense: 8 g; Refill: 0 - predniSONE  (DELTASONE ) 20 MG tablet; Take 1 tablet (20 mg total) by mouth daily with breakfast for 5 days.  Dispense: 5 tablet; Refill: 0  - Take meds as prescribed - Rest voice - Use a cool mist humidifier especially during the winter months when heat dries out the air. - Use saline nose sprays frequently to help soothe nasal passages if they are drying out. - Stay hydrated by drinking plenty of fluids - Keep thermostat turn down low to prevent drying out which can cause a dry cough.  If you do not improve you will need a follow up visit in person.  Reviewed side effects, risks and benefits of medication.    Patient acknowledged agreement and understanding of the plan.   Past Medical, Surgical, Social History, Allergies, and Medications have been Reviewed.    Follow Up Instructions: I discussed the assessment and treatment plan with the patient. The patient was provided an opportunity to ask questions and all were answered. The patient agreed with the plan and demonstrated an understanding of the instructions.  A copy of instructions were sent to the patient via MyChart unless otherwise noted below.    The patient was advised to call back or seek an in-person evaluation if the symptoms worsen or if the condition fails to improve as anticipated.    Chiquita CHRISTELLA Barefoot, NP

## 2024-11-21 NOTE — Progress Notes (Signed)
 Subjective   HPI:  [name] is a 33 y.o.  female who presents to the office with:  Chief Complaint  Patient presents with   Gynecologic Exam   Patient presents today for her pap smear. Hx of NILM, HPV positive in 2023 and 2024. She voices no vaginal concerns. She would also like her lungs to be checked. She was seen the other day for bronchitis and is finishing a course of azithromycin  and prednisone . Cough and breathing symptoms have improved. No fevers, chills.   ROS: Negative for constitutional, CV, Resp, Abd/GU symptoms except as noted above in HPI.  PMH: Past medical history, Past surgical history, Social history, family history were reviewed as noted in EMR.  Pertinent for:  Past Medical History[1]   Medications and allergies reviewed.   Objective    Physical Exam:  Vital Signs: BP 125/80   Pulse 91   Temp 97.3 F (36.3 C) (Temporal)   Resp 15   Ht 5' 5.5 (1.664 m)   Wt 202 lb (91.6 kg)   LMP 11/13/2024 (Exact Date)   SpO2 99%   Breastfeeding No   BMI 33.10 kg/m   General:  Well appearing, Alert & Oriented CV:  Normal to palpation without thrill.  Regular Rate and Rhythm, No murmurs, rubs, or gallops.   Respiratory:  Normal respiratory effort.  No wheezes or crackles.  Good air movement without diminished sounds.   Abdomen:  Normal bowel sounds.  Non-tender to palpation.  No rebound or guarding.  No palpable liver or spleen. GU:  Normal external female anatomy. Vagina normal. Cervix normal. Bimanual exam unremarkable. Chaperone present. Extremities:  No pretibial edema  Assessment/Plan   Orders Placed This Encounter  Procedures   CT, GC & TV, NAA Vag Swab Genital   IGP,Aptima HPV,Age Gdln    Abnormal cervical Papanicolaou smear Pap updated today and will follow up pending results.   Generalized anxiety disorder Stable, continue Zoloft. Refill sent today.   Bronchitis E visit 11/18/24 reviewed. Reassured patient lungs are clear today and it is  reassuring she reports improvement in her symptoms. Finish azithromycin  and prednisone  as prescribed. Follow up if symptoms fail to improve or worsen.  Follow up in about 2 months (around 01/22/2025) for CPE.  No future appointments.  Medications at end of visit today: Current Medications[2]       [1] Past Medical History: Diagnosis Date   Allergy Childhood   Seasonal allergies   Anxiety 2009   Diagnosed with General Anxiety Disorder   Asthma (*) Childhood   Seasonal allergies triggered   Bilateral sciatica 10/17/2022  [2]  Current Outpatient Medications:    budesonide-formoterol (SYMBICORT) 80-4.5 mcg/actuation inhaler, Inhale two puffs into the lungs 2 (two) times daily., Disp: 10.2 g, Rfl: 0   cetirizine  (ZYRTEC ) 10 mg tablet, Take one tablet (10 mg dose) by mouth daily., Disp: 30 tablet, Rfl: 2   fluticasone  propionate (FLONASE ) 50 mcg/actuation nasal spray, one spray by Nasal route daily., Disp: 16 g, Rfl: 0   sertraline (ZOLOFT) 50 mg tablet, Take one tablet (50 mg dose) by mouth daily., Disp: 90 tablet, Rfl: 1

## 2024-12-26 ENCOUNTER — Other Ambulatory Visit: Payer: Self-pay

## 2024-12-26 DIAGNOSIS — N96 Recurrent pregnancy loss: Secondary | ICD-10-CM

## 2025-01-20 ENCOUNTER — Inpatient Hospital Stay (HOSPITAL_COMMUNITY)

## 2025-01-20 ENCOUNTER — Encounter (HOSPITAL_COMMUNITY): Payer: Self-pay | Admitting: *Deleted

## 2025-01-20 ENCOUNTER — Inpatient Hospital Stay (HOSPITAL_COMMUNITY)
Admission: AD | Admit: 2025-01-20 | Discharge: 2025-01-21 | Disposition: A | Discharge status: Home / Self Care | Attending: Obstetrics and Gynecology | Admitting: Obstetrics and Gynecology

## 2025-01-20 DIAGNOSIS — O219 Vomiting of pregnancy, unspecified: Secondary | ICD-10-CM | POA: Insufficient documentation

## 2025-01-20 DIAGNOSIS — R109 Unspecified abdominal pain: Secondary | ICD-10-CM | POA: Insufficient documentation

## 2025-01-20 DIAGNOSIS — K5900 Constipation, unspecified: Secondary | ICD-10-CM | POA: Insufficient documentation

## 2025-01-20 DIAGNOSIS — O3680X0 Pregnancy with inconclusive fetal viability, not applicable or unspecified: Secondary | ICD-10-CM | POA: Insufficient documentation

## 2025-01-20 DIAGNOSIS — O26899 Other specified pregnancy related conditions, unspecified trimester: Secondary | ICD-10-CM

## 2025-01-20 DIAGNOSIS — O26891 Other specified pregnancy related conditions, first trimester: Secondary | ICD-10-CM | POA: Insufficient documentation

## 2025-01-20 DIAGNOSIS — Z3A10 10 weeks gestation of pregnancy: Secondary | ICD-10-CM | POA: Insufficient documentation

## 2025-01-20 LAB — COMPREHENSIVE METABOLIC PANEL WITH GFR
ALT: 15 U/L (ref 0–44)
AST: 14 U/L — ABNORMAL LOW (ref 15–41)
Albumin: 4.3 g/dL (ref 3.5–5.0)
Alkaline Phosphatase: 39 U/L (ref 38–126)
Anion gap: 11 (ref 5–15)
BUN: 10 mg/dL (ref 6–20)
CO2: 22 mmol/L (ref 22–32)
Calcium: 9.6 mg/dL (ref 8.9–10.3)
Chloride: 103 mmol/L (ref 98–111)
Creatinine, Ser: 0.68 mg/dL (ref 0.44–1.00)
GFR, Estimated: >60 mL/min
Glucose, Bld: 90 mg/dL (ref 70–99)
Potassium: 3.9 mmol/L (ref 3.5–5.1)
Sodium: 136 mmol/L (ref 135–145)
Total Bilirubin: 0.3 mg/dL (ref 0.0–1.2)
Total Protein: 6.9 g/dL (ref 6.5–8.1)

## 2025-01-20 LAB — URINALYSIS, ROUTINE W REFLEX MICROSCOPIC
Bilirubin Urine: NEGATIVE
Glucose, UA: NEGATIVE mg/dL
Hgb urine dipstick: NEGATIVE
Ketones, ur: NEGATIVE mg/dL
Leukocytes,Ua: NEGATIVE
Nitrite: NEGATIVE
Protein, ur: 30 mg/dL — AB
Specific Gravity, Urine: 1.028 (ref 1.005–1.030)
pH: 5 (ref 5.0–8.0)

## 2025-01-20 LAB — CBC
HCT: 37.1 % (ref 36.0–46.0)
Hemoglobin: 12.6 g/dL (ref 12.0–15.0)
MCH: 31.4 pg (ref 26.0–34.0)
MCHC: 34 g/dL (ref 30.0–36.0)
MCV: 92.5 fL (ref 80.0–100.0)
Platelets: 215 10*3/uL (ref 150–400)
RBC: 4.01 MIL/uL (ref 3.87–5.11)
RDW: 13.4 % (ref 11.5–15.5)
WBC: 10.6 10*3/uL — ABNORMAL HIGH (ref 4.0–10.5)
nRBC: 0 % (ref 0.0–0.2)

## 2025-01-20 LAB — WET PREP, GENITAL
Clue Cells Wet Prep HPF POC: NONE SEEN
Sperm: NONE SEEN
Trich, Wet Prep: NONE SEEN
WBC, Wet Prep HPF POC: <10
Yeast Wet Prep HPF POC: NONE SEEN

## 2025-01-20 LAB — ABO/RH: ABO/RH(D): A POS

## 2025-01-20 LAB — HCG, QUANTITATIVE, PREGNANCY: hCG, Beta Chain, Quant, S: 97608 m[IU]/mL — ABNORMAL HIGH

## 2025-01-20 MED ORDER — DOXYLAMINE-PYRIDOXINE 10-10 MG PO TBEC: 2.0000 | DELAYED_RELEASE_TABLET | Freq: Every day | ORAL | 0 refills | Status: AC

## 2025-01-20 MED ORDER — PSYLLIUM 58.6 % PO PACK: 1.0000 | PACK | Freq: Every day | ORAL | 0 refills | Status: AC

## 2025-01-20 MED ORDER — POLYETHYLENE GLYCOL 3350 17 GM/SCOOP PO POWD: 17.0000 g | Freq: Every day | ORAL | 1 refills | Status: AC | PRN

## 2025-01-20 MED ORDER — ONDANSETRON HCL 4 MG PO TABS
4.0000 mg | ORAL_TABLET | Freq: Once | ORAL | Status: AC
  Administered 2025-01-20: 4 mg via ORAL
  Filled 2025-01-20: qty 1

## 2025-01-20 MED ORDER — SENNOSIDES-DOCUSATE SODIUM 8.6-50 MG PO TABS: 2.0000 | ORAL_TABLET | Freq: Every evening | ORAL | 0 refills | Status: AC | PRN

## 2025-01-20 NOTE — [facility] Note (Signed)
 [name] is a 34 y.o.  here in [facility] reporting: has not been able to keep anything down for 3 days and having cramping as well. Stated today able to keep  fluid and food dlwn today but still having  cramping .  Had pregnancy confirmed in [facility]  with U/s IUP  LMP: 11/13/24 Onset of complaint: 3 days  Pain score: 6 Vitals:   01/20/25 1554  BP: 130/76  Pulse: 92  Resp: 18  Temp: 99 F (37.2 C)     FHT: n/a   Lab orders placed from triage: u/a UPT, Wet prep gc

## 2025-01-20 NOTE — Progress Notes (Signed)
 Written and verbal d/c instructions given and pt voiced understanding.

## 2025-01-21 LAB — GC/CHLAMYDIA PROBE AMP (~~LOC~~) NOT AT ARMC
Chlamydia: NEGATIVE
Comment: NEGATIVE
Comment: NORMAL
Neisseria Gonorrhea: NEGATIVE

## 2025-02-13 ENCOUNTER — Ambulatory Visit
